# Patient Record
Sex: Male | Born: 1985 | Race: White | Hispanic: No | Marital: Single | Smoking: Never smoker
Health system: Southern US, Community
[De-identification: ages and names within clinical notes are randomized; demographics above are authoritative.]

## PROBLEM LIST (undated history)

## (undated) DIAGNOSIS — F419 Anxiety disorder, unspecified: Secondary | ICD-10-CM

## (undated) DIAGNOSIS — G4730 Sleep apnea, unspecified: Secondary | ICD-10-CM

## (undated) DIAGNOSIS — K219 Gastro-esophageal reflux disease without esophagitis: Secondary | ICD-10-CM

## (undated) DIAGNOSIS — D649 Anemia, unspecified: Secondary | ICD-10-CM

## (undated) DIAGNOSIS — F32A Depression, unspecified: Secondary | ICD-10-CM

## (undated) DIAGNOSIS — F329 Major depressive disorder, single episode, unspecified: Secondary | ICD-10-CM

## (undated) HISTORY — DX: Anxiety disorder, unspecified: F41.9

## (undated) HISTORY — DX: Major depressive disorder, single episode, unspecified: F32.9

## (undated) HISTORY — DX: Depression, unspecified: F32.A

---
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
## 2006-02-03 ENCOUNTER — Emergency Department: Payer: Self-pay | Admitting: Emergency Medicine

## 2007-07-03 ENCOUNTER — Emergency Department: Payer: Self-pay | Admitting: Emergency Medicine

## 2009-05-19 ENCOUNTER — Emergency Department: Payer: Self-pay | Admitting: Emergency Medicine

## 2009-05-20 ENCOUNTER — Emergency Department: Payer: Self-pay | Admitting: Emergency Medicine

## 2009-08-30 ENCOUNTER — Emergency Department: Payer: Self-pay | Admitting: Emergency Medicine

## 2010-10-10 ENCOUNTER — Ambulatory Visit: Payer: Self-pay

## 2010-10-19 ENCOUNTER — Ambulatory Visit: Payer: Self-pay | Admitting: Nephrology

## 2011-08-01 HISTORY — PX: OTHER SURGICAL HISTORY: SHX169

## 2011-09-08 ENCOUNTER — Emergency Department: Payer: Self-pay | Admitting: Unknown Physician Specialty

## 2012-03-19 ENCOUNTER — Emergency Department: Payer: Self-pay | Admitting: Emergency Medicine

## 2012-12-25 ENCOUNTER — Emergency Department: Payer: Self-pay | Admitting: Internal Medicine

## 2015-01-07 ENCOUNTER — Telehealth: Payer: Self-pay | Admitting: Unknown Physician Specialty

## 2015-01-07 NOTE — Telephone Encounter (Signed)
Pt has been added to Cheryl's schedule for tomorrow 01/08/15 @ 1:30pm. Thanks.

## 2015-01-08 ENCOUNTER — Encounter: Payer: Self-pay | Admitting: Unknown Physician Specialty

## 2015-01-08 ENCOUNTER — Ambulatory Visit (INDEPENDENT_AMBULATORY_CARE_PROVIDER_SITE_OTHER): Payer: Self-pay | Admitting: Unknown Physician Specialty

## 2015-01-08 VITALS — BP 110/75 | HR 96 | Temp 98.8°F | Ht 68.5 in | Wt 234.0 lb

## 2015-01-08 DIAGNOSIS — F419 Anxiety disorder, unspecified: Secondary | ICD-10-CM | POA: Insufficient documentation

## 2015-01-08 DIAGNOSIS — F32A Depression, unspecified: Secondary | ICD-10-CM | POA: Insufficient documentation

## 2015-01-08 DIAGNOSIS — G4700 Insomnia, unspecified: Secondary | ICD-10-CM | POA: Insufficient documentation

## 2015-01-08 DIAGNOSIS — F329 Major depressive disorder, single episode, unspecified: Secondary | ICD-10-CM | POA: Insufficient documentation

## 2015-01-08 MED ORDER — CITALOPRAM HYDROBROMIDE 20 MG PO TABS: 20.0000 mg | ORAL_TABLET | Freq: Every day | ORAL | Status: DC

## 2015-01-08 NOTE — Patient Instructions (Addendum)
Insomnia Insomnia is frequent trouble falling and/or staying asleep. Insomnia can be a long term problem or a short term problem. Both are common. Insomnia can be a short term problem when the wakefulness is related to a certain stress or worry. Long term insomnia is often related to ongoing stress during waking hours and/or poor sleeping habits. Overtime, sleep deprivation itself can make the problem worse. Every little thing feels more severe because you are overtired and your ability to cope is decreased. CAUSES   Stress, anxiety, and depression.  Poor sleeping habits.  Distractions such as TV in the bedroom.  Naps close to bedtime.  Engaging in emotionally charged conversations before bed.  Technical reading before sleep.  Alcohol and other sedatives. They may make the problem worse. They can hurt normal sleep patterns and normal dream activity.  Stimulants such as caffeine for several hours prior to bedtime.  Pain syndromes and shortness of breath can cause insomnia.  Exercise late at night.  Changing time zones may cause sleeping problems (jet lag). It is sometimes helpful to have someone observe your sleeping patterns. They should look for periods of not breathing during the night (sleep apnea). They should also look to see how long those periods last. If you live alone or observers are uncertain, you can also be observed at a sleep clinic where your sleep patterns will be professionally monitored. Sleep apnea requires a checkup and treatment. Give your caregivers your medical history. Give your caregivers observations your family has made about your sleep.  SYMPTOMS   Not feeling rested in the morning.  Anxiety and restlessness at bedtime.  Difficulty falling and staying asleep. TREATMENT   Your caregiver may prescribe treatment for an underlying medical disorders. Your caregiver can give advice or help if you are using alcohol or other drugs for self-medication. Treatment  of underlying problems will usually eliminate insomnia problems.  Medications can be prescribed for short time use. They are generally not recommended for lengthy use.  Over-the-counter sleep medicines are not recommended for lengthy use. They can be habit forming.  You can promote easier sleeping by making lifestyle changes such as:  Using relaxation techniques that help with breathing and reduce muscle tension.  Exercising earlier in the day.  Changing your diet and the time of your last meal. No night time snacks.  Establish a regular time to go to bed.  Counseling can help with stressful problems and worry.  Soothing music and white noise may be helpful if there are background noises you cannot remove.  Stop tedious detailed work at least one hour before bedtime. HOME CARE INSTRUCTIONS   Keep a diary. Inform your caregiver about your progress. This includes any medication side effects. See your caregiver regularly. Take note of:  Times when you are asleep.  Times when you are awake during the night.  The quality of your sleep.  How you feel the next day. This information will help your caregiver care for you.  Get out of bed if you are still awake after 15 minutes. Read or do some quiet activity. Keep the lights down. Wait until you feel sleepy and go back to bed.  Keep regular sleeping and waking hours. Avoid naps.  Exercise regularly.  Avoid distractions at bedtime. Distractions include watching television or engaging in any intense or detailed activity like attempting to balance the household checkbook.  Develop a bedtime ritual. Keep a familiar routine of bathing, brushing your teeth, climbing into bed at the same   time each night, listening to soothing music. Routines increase the success of falling to sleep faster.  Use relaxation techniques. This can be using breathing and muscle tension release routines. It can also include visualizing peaceful scenes. You can  also help control troubling or intruding thoughts by keeping your mind occupied with boring or repetitive thoughts like the old concept of counting sheep. You can make it more creative like imagining planting one beautiful flower after another in your backyard garden.  During your day, work to eliminate stress. When this is not possible use some of the previous suggestions to help reduce the anxiety that accompanies stressful situations. MAKE SURE YOU:   Understand these instructions.  Will watch your condition.  Will get help right away if you are not doing well or get worse. Document Released: 07/14/2000 Document Revised: 10/09/2011 Document Reviewed: 08/14/2007 [facility] Patient Information 2015 [place], [place]. This information is not intended to replace advice given to you by your health care provider. Make sure you discuss any questions you have with your health care provider. Panic Attacks Panic attacks are sudden, short-livedsurges of severe anxiety, fear, or discomfort. They may occur for no reason when you are relaxed, when you are anxious, or when you are sleeping. Panic attacks may occur for a number of reasons:   Healthy people occasionally have panic attacks in extreme, life-threatening situations, such as war or natural disasters. Normal anxiety is a protective mechanism of the body that helps [place] react to danger (fight or flight response).  Panic attacks are often seen with anxiety disorders, such as panic disorder, social anxiety disorder, generalized anxiety disorder, and phobias. Anxiety disorders cause excessive or uncontrollable anxiety. They may interfere with your relationships or other life activities.  Panic attacks are sometimes seen with other mental illnesses, such as depression and posttraumatic stress disorder.  Certain medical conditions, prescription medicines, and drugs of abuse can cause panic attacks. SYMPTOMS  Panic attacks start suddenly, peak within 20  minutes, and are accompanied by four or more of the following symptoms:  Pounding heart or fast heart rate (palpitations).  Sweating.  Trembling or shaking.  Shortness of breath or feeling smothered.  Feeling choked.  Chest pain or discomfort.  Nausea or strange feeling in your stomach.  Dizziness, light-headedness, or feeling like you will faint.  Chills or hot flushes.  Numbness or tingling in your lips or hands and feet.  Feeling that things are not real or feeling that you are not yourself.  Fear of losing control or going crazy.  Fear of dying. Some of these symptoms can mimic serious medical conditions. For example, you may think you are having a heart attack. Although panic attacks can be very scary, they are not life threatening. DIAGNOSIS  Panic attacks are diagnosed through an assessment by your health care provider. Your health care provider will ask questions about your symptoms, such as where and when they occurred. Your health care provider will also ask about your medical history and use of alcohol and drugs, including prescription medicines. Your health care provider may order blood tests or other studies to rule out a serious medical condition. Your health care provider may refer you to a mental health professional for further evaluation. TREATMENT   Most healthy people who have one or two panic attacks in an extreme, life-threatening situation will not require treatment.  The treatment for panic attacks associated with anxiety disorders or other mental illness typically involves counseling with a mental health professional, medicine,  or a combination of both. Your health care provider will help determine what treatment is best for you.  Panic attacks due to physical illness usually go away with treatment of the illness. If prescription medicine is causing panic attacks, talk with your health care provider about stopping the medicine, decreasing the dose, or  substituting another medicine.  Panic attacks due to alcohol or drug abuse go away with abstinence. Some adults need professional help in order to stop drinking or using drugs. HOME CARE INSTRUCTIONS   Take all medicines as directed by your health care provider.   Schedule and attend follow-up visits as directed by your health care provider. It is important to keep all your appointments. SEEK MEDICAL CARE IF:  You are not able to take your medicines as prescribed.  Your symptoms do not improve or get worse. SEEK IMMEDIATE MEDICAL CARE IF:   You experience panic attack symptoms that are different than your usual symptoms.  You have serious thoughts about hurting yourself or others.  You are taking medicine for panic attacks and have a serious side effect. MAKE SURE YOU:  Understand these instructions.  Will watch your condition.  Will get help right away if you are not doing well or get worse. Document Released: 07/17/2005 Document Revised: 07/22/2013 Document Reviewed: 02/28/2013 [facility] Patient Information 2015 [place], [place]. This information is not intended to replace advice given to you by your health care provider. Make sure you discuss any questions you have with your health care provider.  2 websites helpful for sleep are [email or web address]

## 2015-01-08 NOTE — Progress Notes (Signed)
   BP 110/75 mmHg  Pulse 96  Temp(Src) 98.8 F (37.1 C) (Oral)  Ht 5' 8.5" (1.74 m)  Wt 234 lb (106.142 kg)  BMI 35.06 kg/m2  SpO2 98%   Subjective:    Patient ID: David Wong, male    DOB: 04/27/1986, 29 y.o.   MRN: [id number]  HPI: David Wong is a 29 y.o. male  Chief Complaint  Patient presents with  . Follow-up    Med refill   Anxiety Symptoms include depressed mood, insomnia, palpitations and shortness of breath. Primary symptoms comment: Depression is a little worse. Symptoms occur occasionally (More frequent). The symptoms are aggravated by work stress. The patient sleeps 5 hours per night. The quality of sleep is poor. Nighttime awakenings: several.   There are no known risk factors. His past medical history is significant for anxiety/panic attacks. Past treatments include SSRIs. The treatment provided significant relief. Compliance with prior treatments has been good.    Relevant past medical, surgical, family and social history reviewed and updated as indicated. Interim medical history since our last visit reviewed. Allergies and medications reviewed and updated.  Review of Systems  Respiratory: Positive for shortness of breath.   Cardiovascular: Positive for palpitations.  Psychiatric/Behavioral: The patient has insomnia.     Per HPI unless specifically indicated above     Objective:    BP 110/75 mmHg  Pulse 96  Temp(Src) 98.8 F (37.1 C) (Oral)  Ht 5' 8.5" (1.74 m)  Wt 234 lb (106.142 kg)  BMI 35.06 kg/m2  SpO2 98%  Wt Readings from Last 3 Encounters:  01/08/15 234 lb (106.142 kg)  06/19/14 226 lb (102.513 kg)    Physical Exam  Constitutional: He is oriented to person, place, and time. He appears well-developed and well-nourished. No distress.  HENT:  Head: Normocephalic and atraumatic.  Eyes: Conjunctivae and lids are normal. Right eye exhibits no discharge. Left eye exhibits no discharge. No scleral icterus.  Cardiovascular: Normal rate,  regular rhythm and normal heart sounds.   Pulmonary/Chest: Effort normal and breath sounds normal. No respiratory distress.  Abdominal: Normal appearance. There is no splenomegaly or hepatomegaly. There is no tenderness.  Musculoskeletal: Normal range of motion.  Neurological: He is alert and oriented to person, place, and time.  Skin: Skin is intact. No rash noted. No pallor.  Psychiatric: He has a normal mood and affect. His behavior is normal. Judgment and thought content normal.       No results found for this or any previous visit.    Assessment & Plan:   Problem List Items Addressed This Visit      Other   Anxiety - Primary    Pt doesn't want to go up on the Citalopram.  Discussed developing an exercise plan.  Go out with friends more.        Relevant Medications   citalopram (CELEXA) 20 MG tablet   Insomnia    Discussed exercise and handout given.  Discussed CBT for sleep          Follow up plan: Return in about 6 months (around 07/10/2015) for physical.

## 2015-01-08 NOTE — Assessment & Plan Note (Signed)
Pt doesn't want to go up on the Citalopram.  Discussed developing an exercise plan.  Go out with friends more.

## 2015-01-08 NOTE — Assessment & Plan Note (Addendum)
Discussed exercise and handout given.  Discussed CBT for sleep

## 2015-03-08 ENCOUNTER — Ambulatory Visit (INDEPENDENT_AMBULATORY_CARE_PROVIDER_SITE_OTHER): Payer: Worker's Compensation | Admitting: Urgent Care

## 2015-03-08 VITALS — BP 108/72 | HR 88 | Temp 98.3°F | Resp 18 | Ht 69.5 in | Wt 230.4 lb

## 2015-03-08 DIAGNOSIS — S61011A Laceration without foreign body of right thumb without damage to nail, initial encounter: Secondary | ICD-10-CM | POA: Diagnosis not present

## 2015-03-08 DIAGNOSIS — Z23 Encounter for immunization: Secondary | ICD-10-CM

## 2015-03-08 MED ORDER — MUPIROCIN 2 % EX OINT: 1.0000 "application " | TOPICAL_OINTMENT | Freq: Three times a day (TID) | CUTANEOUS | Status: DC

## 2015-03-08 NOTE — Patient Instructions (Addendum)
Laceration Care, Adult A laceration is a cut or lesion that goes through all layers of the skin and into the tissue just beneath the skin. TREATMENT  Some lacerations may not require closure. Some lacerations may not be able to be closed due to an increased risk of infection. It is important to see your caregiver as soon as possible after an injury to minimize the risk of infection and maximize the opportunity for successful closure. If closure is appropriate, pain medicines may be given, if needed. The wound will be cleaned to help prevent infection. Your caregiver will use stitches (sutures), staples, wound glue (adhesive), or skin adhesive strips to repair the laceration. These tools bring the skin edges together to allow for faster healing and a better cosmetic outcome. However, all wounds will heal with a scar. Once the wound has healed, scarring can be minimized by covering the wound with sunscreen during the day for 1 full year. HOME CARE INSTRUCTIONS  For sutures or staples:  Keep the wound clean and dry.  If you were given a bandage (dressing), you should change it at least once a day. Also, change the dressing if it becomes wet or dirty, or as directed by your caregiver.  Wash the wound with soap and water 2 times a day. Rinse the wound off with water to remove all soap. Pat the wound dry with a clean towel.  After cleaning, apply a thin layer of the antibiotic ointment as recommended by your caregiver. This will help prevent infection and keep the dressing from sticking.  You may shower as usual after the first 24 hours. Do not soak the wound in water until the sutures are removed.  Only take over-the-counter or prescription medicines for pain, discomfort, or fever as directed by your caregiver.  Get your sutures or staples removed as directed by your caregiver. For skin adhesive strips:  Keep the wound clean and dry.  Do not get the skin adhesive strips wet. You may bathe  carefully, using caution to keep the wound dry.  If the wound gets wet, pat it dry with a clean towel.  Skin adhesive strips will fall off on their own. You may trim the strips as the wound heals. Do not remove skin adhesive strips that are still stuck to the wound. They will fall off in time. For wound adhesive:  You may briefly wet your wound in the shower or bath. Do not soak or scrub the wound. Do not swim. Avoid periods of heavy perspiration until the skin adhesive has fallen off on its own. After showering or bathing, gently pat the wound dry with a clean towel.  Do not apply liquid medicine, cream medicine, or ointment medicine to your wound while the skin adhesive is in place. This may loosen the film before your wound is healed.  If a dressing is placed over the wound, be careful not to apply tape directly over the skin adhesive. This may cause the adhesive to be pulled off before the wound is healed.  Avoid prolonged exposure to sunlight or tanning lamps while the skin adhesive is in place. Exposure to ultraviolet light in the first year will darken the scar.  The skin adhesive will usually remain in place for 5 to 10 days, then naturally fall off the skin. Do not pick at the adhesive film. You may need a tetanus shot if:  You cannot remember when you had your last tetanus shot.  You have never had a tetanus  shot. If you get a tetanus shot, your arm may swell, get red, and feel warm to the touch. This is common and not a problem. If you need a tetanus shot and you choose not to have one, there is a rare chance of getting tetanus. Sickness from tetanus can be serious. SEEK MEDICAL CARE IF:   You have redness, swelling, or increasing pain in the wound.  You see a red line that goes away from the wound.  You have yellowish-white fluid (pus) coming from the wound.  You have a fever.  You notice a bad smell coming from the wound or dressing.  Your wound breaks open before or  after sutures have been removed.  You notice something coming out of the wound such as wood or glass.  Your wound is on your hand or foot and you cannot move a finger or toe. SEEK IMMEDIATE MEDICAL CARE IF:   Your pain is not controlled with prescribed medicine.  You have severe swelling around the wound causing pain and numbness or a change in color in your arm, hand, leg, or foot.  Your wound splits open and starts bleeding.  You have worsening numbness, weakness, or loss of function of any joint around or beyond the wound.  You develop painful lumps near the wound or on the skin anywhere on your body. MAKE SURE YOU:   Understand these instructions.  Will watch your condition.  Will get help right away if you are not doing well or get worse. Document Released: 07/17/2005 Document Revised: 10/09/2011 Document Reviewed: 01/10/2011 [facility] Patient Information 2015 [place], [place]. This information is not intended to replace advice given to you by your health care provider. Make sure you discuss any questions you have with your health care provider.  Tdap Vaccine (Tetanus, Diphtheria, Pertussis): What You Need to Know 1. Why get vaccinated? Tetanus, diphtheria and pertussis can be very serious diseases, even for adolescents and adults. Tdap vaccine can protect [place] from these diseases. TETANUS (Lockjaw) causes painful muscle tightening and stiffness, usually all over the body.  It can lead to tightening of muscles in the head and neck so you can't open your mouth, swallow, or sometimes even breathe. Tetanus kills about 1 out of 5 people who are infected. DIPHTHERIA can cause a thick coating to form in the back of the throat.  It can lead to breathing problems, paralysis, heart failure, and death. PERTUSSIS (Whooping Cough) causes severe coughing spells, which can cause difficulty breathing, vomiting and disturbed sleep.  It can also lead to weight loss, incontinence, and rib  fractures. Up to 2 in 100 adolescents and 5 in 100 adults with pertussis are hospitalized or have complications, which could include pneumonia or death. These diseases are caused by bacteria. Diphtheria and pertussis are spread from person to person through coughing or sneezing. Tetanus enters the body through cuts, scratches, or wounds. Before vaccines, the [place] saw as many as 200,000 cases a year of diphtheria and pertussis, and hundreds of cases of tetanus. Since vaccination began, tetanus and diphtheria have dropped by about 99% and pertussis by about 80%. 2. Tdap vaccine Tdap vaccine can protect adolescents and adults from tetanus, diphtheria, and pertussis. One dose of Tdap is routinely given at age 67 or 52. People who did not get Tdap at that age should get it as soon as possible. Tdap is especially important for health care professionals and anyone having close contact with a baby younger than 12 months. Pregnant  women should get a dose of Tdap during every pregnancy, to protect the newborn from pertussis. Infants are most at risk for severe, life-threatening complications from pertussis. A similar vaccine, called Td, protects from tetanus and diphtheria, but not pertussis. A Td booster should be given every 10 years. Tdap may be given as one of these boosters if you have not already gotten a dose. Tdap may also be given after a severe cut or burn to prevent tetanus infection. Your doctor can give you more information. Tdap may safely be given at the same time as other vaccines. 3. Some people should not get this vaccine  If you ever had a life-threatening allergic reaction after a dose of any tetanus, diphtheria, or pertussis containing vaccine, OR if you have a severe allergy to any part of this vaccine, you should not get Tdap. Tell your doctor if you have any severe allergies.  If you had a coma, or long or multiple seizures within 7 days after a childhood dose of DTP or DTaP, you  should not get Tdap, unless a cause other than the vaccine was found. You can still get Td.  Talk to your doctor if you:  have epilepsy or another nervous system problem,  had severe pain or swelling after any vaccine containing diphtheria, tetanus or pertussis,  ever had Guillain-Barr Syndrome (GBS),  aren't feeling well on the day the shot is scheduled. 4. Risks of a vaccine reaction With any medicine, including vaccines, there is a chance of side effects. These are usually mild and go away on their own, but serious reactions are also possible. Brief fainting spells can follow a vaccination, leading to injuries from falling. Sitting or lying down for about 15 minutes can help prevent these. Tell your doctor if you feel dizzy or light-headed, or have vision changes or ringing in the ears. Mild problems following Tdap (Did not interfere with activities)  Pain where the shot was given (about 3 in 4 adolescents or 2 in 3 adults)  Redness or swelling where the shot was given (about 1 person in 5)  Mild fever of at least 100.70F (up to about 1 in 25 adolescents or 1 in 100 adults)  Headache (about 3 or 4 people in 10)  Tiredness (about 1 person in 3 or 4)  Nausea, vomiting, diarrhea, stomach ache (up to 1 in 4 adolescents or 1 in 10 adults)  Chills, body aches, sore joints, rash, swollen glands (uncommon) Moderate problems following Tdap (Interfered with activities, but did not require medical attention)  Pain where the shot was given (about 1 in 5 adolescents or 1 in 100 adults)  Redness or swelling where the shot was given (up to about 1 in 16 adolescents or 1 in 25 adults)  Fever over 102F (about 1 in 100 adolescents or 1 in 250 adults)  Headache (about 3 in 20 adolescents or 1 in 10 adults)  Nausea, vomiting, diarrhea, stomach ache (up to 1 or 3 people in 100)  Swelling of the entire arm where the shot was given (up to about 3 in 100). Severe problems following  Tdap (Unable to perform usual activities; required medical attention)  Swelling, severe pain, bleeding and redness in the arm where the shot was given (rare). A severe allergic reaction could occur after any vaccine (estimated less than 1 in a million doses). 5. What if there is a serious reaction? What should I look for?  Look for anything that concerns you, such as signs  of a severe allergic reaction, very high fever, or behavior changes. Signs of a severe allergic reaction can include hives, swelling of the face and throat, difficulty breathing, a fast heartbeat, dizziness, and weakness. These would start a few minutes to a few hours after the vaccination. What should I do?  If you think it is a severe allergic reaction or other emergency that can't wait, call 9-1-1 or get the person to the nearest hospital. Otherwise, call your doctor.  Afterward, the reaction should be reported to the "Vaccine Adverse Event Reporting System" (VAERS). Your doctor might file this report, or you can do it yourself through the VAERS web site at www.vaers.SamedayNews.es, or by calling 6207244126. VAERS is only for reporting reactions. They do not give medical advice.  6. The National Vaccine Injury Compensation Program The [employer] Vaccine Injury Compensation Program (VICP) is a federal program that was created to compensate people who may have been injured by certain vaccines. Persons who believe they may have been injured by a vaccine can learn about the program and about filing a claim by calling 567-209-8585 or visiting the [place] website at GoldCloset.com.ee. 7. How can I learn more?  Ask your doctor.  Call your local or state health department.  Contact the Centers for Disease Control and Prevention (CDC):  Call 502-320-7873 or visit CDC's website at http://hunter.com/. CDC Tdap Vaccine VIS (12/07/11) Document Released: 01/16/2012 Document Revised: 12/01/2013 Document Reviewed:  10/29/2013 [employer] Patient Information 2015 [place], [place]. This information is not intended to replace advice given to you by your health care provider. Make sure you discuss any questions you have with your health care provider.

## 2015-03-08 NOTE — Progress Notes (Addendum)
    MRN: [id number] DOB: [birth year]  Subjective:   David Wong is a 29 y.o. male presenting for chief complaint of workers comp  Reports right thumb laceration today while using a metal grinder at work. Patient had minimal bleeding, used alcohol to clean his wound and was advised by his supervisor to come in for evaluation. Denies loss of range of motion, decreased sensation, decreased strength, numbness or tingling, bony deformity. He cannot recall his last TDAP. Denies any other aggravating or relieving factors, no other questions or concerns.  David Wong's medications list, allergies, pmh and psh were reviewed and excluded from this note due to being a worker's comp case.  ROS As in subjective.  Objective:   Vitals: BP 108/72 mmHg  Pulse 88  Temp(Src) 98.3 F (36.8 C) (Oral)  Resp 18  Ht 5' 9.5" (1.765 m)  Wt 230 lb 6.4 oz (104.509 kg)  BMI 33.55 kg/m2  SpO2 99%  Physical Exam  Constitutional: He is oriented to person, place, and time. He appears well-developed and well-nourished.  Eyes: Conjunctivae are normal. No scleral icterus.  Cardiovascular: Normal rate.   Pulmonary/Chest: Effort normal.  Musculoskeletal:       Right hand: He exhibits normal range of motion, no tenderness, no bony tenderness, normal capillary refill, no deformity, no laceration and no swelling. Normal sensation noted. Normal strength noted.       Hands: Neurological: He is alert and oriented to person, place, and time.  Skin: Skin is warm and dry. No rash noted. No erythema. No pallor.  Psychiatric: He has a normal mood and affect.    Assessment and Plan :   1. Laceration of thumb, right, initial encounter - Stable, wound is 2 superficial for sutures. Wound was cleansed and dressed. Apply Bactroban 2-3 times for one week. Keep covered while at work, return for re-evaluation as needed. - Tdap vaccine greater than or equal to 7yo IM   Jaynee Eagles, PA-C [facility] [phone] 03/08/2015 2:41 PM

## 2015-03-13 NOTE — Progress Notes (Signed)
  Medical screening examination/treatment/procedure(s) were performed by non-physician practitioner and as supervising physician I was immediately available for consultation/collaboration.     

## 2015-06-07 ENCOUNTER — Other Ambulatory Visit: Payer: Self-pay

## 2015-06-07 MED ORDER — CITALOPRAM HYDROBROMIDE 20 MG PO TABS: 20.0000 mg | ORAL_TABLET | Freq: Every day | ORAL | Status: DC

## 2015-06-07 NOTE — Telephone Encounter (Signed)
Patient was last seen in June and has appointment 07/10/15. Pharmacy is [facility] in [place].

## 2015-07-12 ENCOUNTER — Ambulatory Visit (INDEPENDENT_AMBULATORY_CARE_PROVIDER_SITE_OTHER): Payer: Managed Care, Other (non HMO) | Admitting: Unknown Physician Specialty

## 2015-07-12 ENCOUNTER — Encounter: Payer: Self-pay | Admitting: Unknown Physician Specialty

## 2015-07-12 VITALS — BP 120/85 | HR 79 | Temp 98.7°F | Ht 70.2 in | Wt 234.4 lb

## 2015-07-12 DIAGNOSIS — Z0000 Encounter for general adult medical examination without abnormal findings: Secondary | ICD-10-CM | POA: Diagnosis not present

## 2015-07-12 DIAGNOSIS — E669 Obesity, unspecified: Secondary | ICD-10-CM

## 2015-07-12 DIAGNOSIS — F419 Anxiety disorder, unspecified: Secondary | ICD-10-CM | POA: Diagnosis not present

## 2015-07-12 MED ORDER — CITALOPRAM HYDROBROMIDE 20 MG PO TABS: 20.0000 mg | ORAL_TABLET | Freq: Every day | ORAL | Status: DC

## 2015-07-12 NOTE — Progress Notes (Signed)
   BP 120/85 mmHg  Pulse 79  Temp(Src) 98.7 F (37.1 C)  Ht 5' 10.2" (1.783 m)  Wt 234 lb 6.4 oz (106.323 kg)  BMI 33.44 kg/m2  SpO2 97%   Subjective:    Patient ID: David Wong, male    DOB: May 17, 1986, 29 y.o.   MRN: [id number]  HPI: David Wong is a 29 y.o. male  Chief Complaint  Patient presents with  . Medication Refill    pt states he needs citalopram refilled   Also needs physical  Relevant past medical, surgical, family and social history reviewed and updated as indicated. Interim medical history since our last visit reviewed. Allergies and medications reviewed and updated.  Review of Systems  Constitutional: Negative.   HENT: Negative.   Eyes: Negative.   Respiratory: Negative.   Cardiovascular: Negative.   Gastrointestinal: Negative.   Endocrine: Negative.   Genitourinary: Negative.   Skin: Negative.   Allergic/Immunologic: Negative.   Neurological: Negative.   Hematological: Negative.   Psychiatric/Behavioral: Negative.     Per HPI unless specifically indicated above     Objective:    BP 120/85 mmHg  Pulse 79  Temp(Src) 98.7 F (37.1 C)  Ht 5' 10.2" (1.783 m)  Wt 234 lb 6.4 oz (106.323 kg)  BMI 33.44 kg/m2  SpO2 97%  Wt Readings from Last 3 Encounters:  07/12/15 234 lb 6.4 oz (106.323 kg)  03/08/15 230 lb 6.4 oz (104.509 kg)  01/08/15 234 lb (106.142 kg)    Physical Exam  Constitutional: He is oriented to person, place, and time. He appears well-developed and well-nourished.  HENT:  Head: Normocephalic.  Eyes: Pupils are equal, round, and reactive to light.  Cardiovascular: Normal rate, regular rhythm and normal heart sounds.   Pulmonary/Chest: Effort normal.  Abdominal: Soft. Bowel sounds are normal.  Musculoskeletal: Normal range of motion.  Neurological: He is alert and oriented to person, place, and time. He has normal reflexes.  Skin: Skin is warm and dry.  Psychiatric: He has a normal mood and affect. His behavior is  normal. Judgment and thought content normal.    No results found for this or any previous visit.    Assessment & Plan:   Problem List Items Addressed This Visit      Unprioritized   Anxiety   Relevant Medications   citalopram (CELEXA) 20 MG tablet    Other Visit Diagnoses    Annual physical exam    -  Primary    Relevant Orders    CBC    Comprehensive metabolic panel    HIV antibody    Lipid Panel w/o Chol/HDL Ratio    TSH    Obesity          Discussed diet and exercise.     Follow up plan: Return in about 1 year (around 07/11/2016).

## 2015-07-13 ENCOUNTER — Encounter: Payer: Self-pay | Admitting: Unknown Physician Specialty

## 2015-07-13 LAB — COMPREHENSIVE METABOLIC PANEL
ALT: 69 IU/L — ABNORMAL HIGH (ref 0–44)
AST: 29 IU/L (ref 0–40)
Albumin/Globulin Ratio: 1.6 (ref 1.1–2.5)
Albumin: 4.4 g/dL (ref 3.5–5.5)
Alkaline Phosphatase: 101 IU/L (ref 39–117)
BUN/Creatinine Ratio: 14 (ref 8–19)
BUN: 14 mg/dL (ref 6–20)
Bilirubin Total: 0.4 mg/dL (ref 0.0–1.2)
CO2: 21 mmol/L (ref 18–29)
Calcium: 9.4 mg/dL (ref 8.7–10.2)
Chloride: 107 mmol/L — ABNORMAL HIGH (ref 96–106)
Creatinine, Ser: 1.01 mg/dL (ref 0.76–1.27)
GFR calc Af Amer: 116 mL/min/{1.73_m2} (ref >59)
GFR calc non Af Amer: 101 mL/min/{1.73_m2} (ref >59)
Globulin, Total: 2.7 g/dL (ref 1.5–4.5)
Glucose: 98 mg/dL (ref 65–99)
Potassium: 4.1 mmol/L (ref 3.5–5.2)
Sodium: 141 mmol/L (ref 134–144)
Total Protein: 7.1 g/dL (ref 6.0–8.5)

## 2015-07-13 LAB — CBC
Hematocrit: 43.9 % (ref 37.5–51.0)
Hemoglobin: 15.1 g/dL (ref 12.6–17.7)
MCH: 31.7 pg (ref 26.6–33.0)
MCHC: 34.4 g/dL (ref 31.5–35.7)
MCV: 92 fL (ref 79–97)
Platelets: 218 10*3/uL (ref 150–379)
RBC: 4.77 x10E6/uL (ref 4.14–5.80)
RDW: 12.4 % (ref 12.3–15.4)
WBC: 7.7 10*3/uL (ref 3.4–10.8)

## 2015-07-13 LAB — LIPID PANEL W/O CHOL/HDL RATIO
Cholesterol, Total: 258 mg/dL — ABNORMAL HIGH (ref 100–199)
HDL: 46 mg/dL (ref >39)
LDL Calculated: 188 mg/dL — ABNORMAL HIGH (ref 0–99)
Triglycerides: 118 mg/dL (ref 0–149)
VLDL Cholesterol Cal: 24 mg/dL (ref 5–40)

## 2015-07-13 LAB — HIV ANTIBODY (ROUTINE TESTING W REFLEX): HIV Screen 4th Generation wRfx: NONREACTIVE

## 2015-07-13 LAB — TSH: TSH: 1.7 u[IU]/mL (ref 0.450–4.500)

## 2016-06-26 ENCOUNTER — Encounter: Payer: Self-pay | Admitting: Emergency Medicine

## 2016-06-26 ENCOUNTER — Emergency Department
Admission: EM | Admit: 2016-06-26 | Discharge: 2016-06-26 | Disposition: A | Discharge status: Home / Self Care | Payer: Self-pay | Attending: Emergency Medicine | Admitting: Emergency Medicine

## 2016-06-26 DIAGNOSIS — Y929 Unspecified place or not applicable: Secondary | ICD-10-CM | POA: Insufficient documentation

## 2016-06-26 DIAGNOSIS — Y9389 Activity, other specified: Secondary | ICD-10-CM | POA: Insufficient documentation

## 2016-06-26 DIAGNOSIS — Y999 Unspecified external cause status: Secondary | ICD-10-CM | POA: Insufficient documentation

## 2016-06-26 DIAGNOSIS — S61214A Laceration without foreign body of right ring finger without damage to nail, initial encounter: Secondary | ICD-10-CM | POA: Insufficient documentation

## 2016-06-26 DIAGNOSIS — W268XXA Contact with other sharp object(s), not elsewhere classified, initial encounter: Secondary | ICD-10-CM | POA: Insufficient documentation

## 2016-06-26 NOTE — ED Notes (Signed)
Pt has laceration to right ring finger - he was working on a car and a bolt broke lose causing a piece of metal to lacerate his finger approx one hour ago

## 2016-06-26 NOTE — ED Triage Notes (Signed)
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

## 2016-06-26 NOTE — ED Provider Notes (Signed)
[facility] Emergency Department Provider Note  ____________________________________________  Time seen: Approximately 1:34 PM  I have reviewed the triage vital signs and the nursing notes.   HISTORY  Chief Complaint Laceration   HPI David Wong is a 30 y.o. male that presents after injuring his right ring finger on car a couple hours ago. Patient denies any additional trauma. Patient states there was minimal bleeding. Patient's last tetanus shot was one year ago.  Past Medical History:  Diagnosis Date  . Anxiety   . Depression     Patient Active Problem List   Diagnosis Date Noted  . Anxiety 01/08/2015  . Clinical depression 01/08/2015  . Insomnia 01/08/2015    Past Surgical History:  Procedure Laterality Date  . right pinky finger  2013   pins inserted    Prior to Admission medications   Medication Sig Start Date End Date Taking? Authorizing Provider  citalopram (CELEXA) 20 MG tablet Take 1 tablet (20 mg total) by mouth daily. 07/12/15   Kathrine Haddock, NP    Allergies Patient has no known allergies.  Family History  Problem Relation Age of Onset  . Hyperlipidemia Mother   . Hypertension Mother   . Hyperlipidemia Father   . Diabetes Father   . Hypertension Brother   . Arthritis Maternal Grandfather     RA  . Heart disease Paternal Grandfather     Social History Social History  Substance Use Topics  . Smoking status: Never Smoker  . Smokeless tobacco: Never Used  . Alcohol use 0.0 oz/week     Comment: Rarely    Review of Systems  Constitutional: Negative for fever/chills Cardiovascular: Negative for chest pain Respiratory: Negative for shortness of breath. Musculoskeletal: Full ROM of finger Skin: No rash or bruising. Neurological: Negative for headaches, focal weakness or numbness. ____________________________________________   PHYSICAL EXAM:  VITAL SIGNS: ED Triage Vitals  Enc Vitals Group     BP 06/26/16  1211 121/85     Pulse Rate 06/26/16 1211 70     Resp 06/26/16 1211 20     Temp 06/26/16 1211 98 F (36.7 C)     Temp Source 06/26/16 1211 Oral     SpO2 06/26/16 1211 99 %     Weight 06/26/16 1211 225 lb (102.1 kg)     Height 06/26/16 1211 5\' 10"  (1.778 m)     Head Circumference --      Peak Flow --      Pain Score 06/26/16 1212 8     Pain Loc --      Pain Edu? --      Excl. in [place]? --      Constitutional: Alert and oriented. Well appearing and in no acute distress. Eyes: Conjunctivae are normal. EOMI. Nose: No congestion/rhinnorhea. Mouth/Throat: Mucous membranes are moist.   Neck: No stridor. Cardiovascular: Good peripheral circulation. Respiratory: Normal respiratory effort.  No retractions. Musculoskeletal: FROM throughout. Neurologic:  Normal speech and language. No gross focal neurologic deficits are appreciated. Sensation in hand and fingers intact.  Skin:  1 cm shallow skin flap at nail base. Nail unaffected.    ____________________________________________   LABS (all labs ordered are listed, but only abnormal results are displayed)  Labs Reviewed - No data to display ____________________________________________  EKG   RADIOLOGY   PROCEDURES  Procedure(s) performed:  Wound was cleaned with normal saline and iodine. Steristrips placed and finger was wrapped in gauze.    INITIAL IMPRESSION / ASSESSMENT AND PLAN /  ED COURSE  Clinical Course    Laceration was shallow so sutures are not indicated. Patient was instructed to leave the Steri-Strips in place and allow to fall off by themselves. He was instructed to return for any signs of infection including fever swelling redness. All patients questions were answered.  Pertinent labs & imaging results that were available during my care of the patient were reviewed by me and considered in my medical decision making (see chart for details).   ____________________________________________   FINAL CLINICAL  IMPRESSION(S) / ED DIAGNOSES  Final diagnoses:  Laceration of right ring finger without foreign body without damage to nail, initial encounter    New Prescriptions   No medications on file    Note:  This document was prepared using Dragon voice recognition software and may include unintentional dictation errors.   Laban Emperor, PA-C 06/26/16 1411    Lavonia Drafts, MD 06/26/16 [phone]

## 2016-07-12 ENCOUNTER — Encounter: Payer: Self-pay | Admitting: Unknown Physician Specialty

## 2016-08-10 ENCOUNTER — Telehealth: Payer: Self-pay

## 2016-08-10 MED ORDER — CITALOPRAM HYDROBROMIDE 20 MG PO TABS: 20.0000 mg | ORAL_TABLET | Freq: Every day | ORAL | 3 refills | Status: DC

## 2016-08-10 NOTE — Telephone Encounter (Signed)
Patient was scheduled to see David Wong tomorrow but his appointment had to be moved due to provider illness. Patient stated that he is completely out of his citalopram and would like enough sent to [facility] to get to his appointment. I told the patient that I would send the message to Dr. Jeananne Rama since David Wong is out of the office.

## 2016-08-11 ENCOUNTER — Ambulatory Visit: Payer: Self-pay | Admitting: Unknown Physician Specialty

## 2016-08-15 ENCOUNTER — Ambulatory Visit: Payer: Self-pay | Admitting: Unknown Physician Specialty

## 2017-01-24 ENCOUNTER — Emergency Department
Admission: EM | Admit: 2017-01-24 | Discharge: 2017-01-24 | Disposition: A | Discharge status: Home / Self Care | Payer: Worker's Compensation | Attending: Emergency Medicine | Admitting: Emergency Medicine

## 2017-01-24 ENCOUNTER — Emergency Department: Payer: Worker's Compensation

## 2017-01-24 ENCOUNTER — Encounter: Payer: Self-pay | Admitting: Emergency Medicine

## 2017-01-24 DIAGNOSIS — Y9301 Activity, walking, marching and hiking: Secondary | ICD-10-CM | POA: Insufficient documentation

## 2017-01-24 DIAGNOSIS — Y990 Civilian activity done for income or pay: Secondary | ICD-10-CM | POA: Diagnosis not present

## 2017-01-24 DIAGNOSIS — Y929 Unspecified place or not applicable: Secondary | ICD-10-CM | POA: Insufficient documentation

## 2017-01-24 DIAGNOSIS — W19XXXA Unspecified fall, initial encounter: Secondary | ICD-10-CM | POA: Diagnosis not present

## 2017-01-24 DIAGNOSIS — Z79899 Other long term (current) drug therapy: Secondary | ICD-10-CM | POA: Insufficient documentation

## 2017-01-24 DIAGNOSIS — S60221A Contusion of right hand, initial encounter: Secondary | ICD-10-CM

## 2017-01-24 DIAGNOSIS — S6991XA Unspecified injury of right wrist, hand and finger(s), initial encounter: Secondary | ICD-10-CM | POA: Diagnosis present

## 2017-01-24 NOTE — Discharge Instructions (Signed)
Ice and elevate as needed for swelling and pain. Wear Ace wrap for padding and protection. Begin taking over-the-counter ibuprofen as needed for pain. Follow-up with your companies doctor or the [employer] comp clinic at [facility].

## 2017-01-24 NOTE — ED Provider Notes (Signed)
[facility] Emergency Department Provider Note ____________________________________________  Time seen: 9:02 AM  I have reviewed the triage vital signs and the nursing notes.  HISTORY  Chief Complaint  Hand Pain   HPI David Wong is a 31 y.o. male is here with a Workmen's Comp. injury that occurred yesterday.  Patient states he fell at work and has had continued pain to his right thumb since that time. He complains of swelling to his right thumb along with increased pain with range of motion. Patient rates his pain as an 8 out of 10.   Past Medical History:  Diagnosis Date  . Anxiety   . Depression     Patient Active Problem List   Diagnosis Date Noted  . Anxiety 01/08/2015  . Clinical depression 01/08/2015  . Insomnia 01/08/2015    Past Surgical History:  Procedure Laterality Date  . right pinky finger  2013   pins inserted    Prior to Admission medications   Medication Sig Start Date End Date Taking? Authorizing Provider  citalopram (CELEXA) 20 MG tablet Take 1 tablet (20 mg total) by mouth daily. 08/10/16   Guadalupe Maple, MD    Allergies Patient has no known allergies.  Family History  Problem Relation Age of Onset  . Hyperlipidemia Mother   . Hypertension Mother   . Hyperlipidemia Father   . Diabetes Father   . Hypertension Brother   . Arthritis Maternal Grandfather        RA  . Heart disease Paternal Grandfather     Social History Social History  Substance Use Topics  . Smoking status: Never Smoker  . Smokeless tobacco: Never Used  . Alcohol use 0.0 oz/week     Comment: Rarely    Review of Systems  Constitutional: Normal activity. Cardiovascular: Negative for chest pain. Respiratory: Negative for shortness of breath. Musculoskeletal: Positive right hand pain. Skin: Positive for bruising. Neurological: Negative for headaches, focal weakness or numbness. ____________________________________________  PHYSICAL  EXAM:  VITAL SIGNS: ED Triage Vitals  Enc Vitals Group     BP 01/24/17 0803 (!) 138/96     Pulse Rate 01/24/17 0803 96     Resp 01/24/17 0803 14     Temp 01/24/17 0803 98.1 F (36.7 C)     Temp Source 01/24/17 0803 Oral     SpO2 01/24/17 0803 99 %     Weight 01/24/17 0809 225 lb (102.1 kg)     Height 01/24/17 0811 5\' 10"  (1.778 m)     Head Circumference --      Peak Flow --      Pain Score 01/24/17 0802 8     Pain Loc --      Pain Edu? --      Excl. in [place]? --     Constitutional: Alert and oriented. Well appearing and in no distress. Head: Normocephalic and atraumatic. Neck: No stridor Cardiovascular: Normal rate, regular rhythm. Normal distal pulses. Respiratory: Normal respiratory effort. No wheezes/rales/rhonchi. Musculoskeletal: On examination of the right hand there is no gross deformity noted. There is moderate swelling of the thumb and first metacarpal area. There is some mild bruising present. Soft tissue swelling is present. Motor sensory function intact. Capillary refill is less than 3 seconds. Skin is intact. Patient is able to flex and extend but is limited due to swelling. Neurologic:  Normal gait without ataxia. Normal speech and language. No gross focal neurologic deficits are appreciated. Skin:  Skin is warm, dry and  intact. Ecchymosis noted on the volar aspect of the right hand medial aspect. Psychiatric: Mood and affect are normal. Patient exhibits appropriate insight and judgment.    RADIOLOGY Right thumb x-ray per radiologist is negative for fracture or dislocation. I, Johnn Hai, personally viewed and evaluated these images (plain radiographs) as part of my medical decision making, as well as reviewing the written report by the radiologist. ____________________________________________  INITIAL IMPRESSION / [place] / ED COURSE  Patient was reassured that the x-ray did not show any fracture. Patient was placed in an Ace wrap for protection  of his right hand. He is to take over-the-counter ibuprofen as needed for pain. He is aware to ice and elevate as needed for swelling and pain. He is follow-up at [facility] or doctor of his company's choice.    ____________________________________________  FINAL CLINICAL IMPRESSION(S) / ED DIAGNOSES  Final diagnoses:  Contusion of right hand, initial encounter  Fall, initial encounter     Philomena Course 01/24/17 1357    Carrie Mew, MD 01/26/17 2318

## 2017-01-24 NOTE — ED Notes (Signed)
Performed W/C with pt;pt completed COC and provided UDS; hand delivered to lab; pt given his and employers copy of [place]

## 2017-01-24 NOTE — ED Notes (Signed)
See triage note  States he was trying to close a bay door yesterday at work  The rope broke  He fell back hitting a table  Having some pain to left posterior shoulder and also right thumb area

## 2017-01-24 NOTE — ED Triage Notes (Addendum)
Pt reports right thumb pain since yesterday, reports he fell on right thumb. No obvious deformity. Swelling noted to right thumb. Pt reports this happened at work, Mr. David Wong in [place].

## 2017-06-29 ENCOUNTER — Ambulatory Visit: Payer: BLUE CROSS/BLUE SHIELD | Admitting: Unknown Physician Specialty

## 2017-06-29 ENCOUNTER — Encounter: Payer: Self-pay | Admitting: Unknown Physician Specialty

## 2017-06-29 DIAGNOSIS — F324 Major depressive disorder, single episode, in partial remission: Secondary | ICD-10-CM

## 2017-06-29 DIAGNOSIS — G4733 Obstructive sleep apnea (adult) (pediatric): Secondary | ICD-10-CM

## 2017-06-29 DIAGNOSIS — R5382 Chronic fatigue, unspecified: Secondary | ICD-10-CM

## 2017-06-29 DIAGNOSIS — R5383 Other fatigue: Secondary | ICD-10-CM | POA: Insufficient documentation

## 2017-06-29 DIAGNOSIS — G4730 Sleep apnea, unspecified: Secondary | ICD-10-CM | POA: Insufficient documentation

## 2017-06-29 MED ORDER — ESCITALOPRAM OXALATE 10 MG PO TABS: 10.0000 mg | ORAL_TABLET | Freq: Every day | ORAL | 1 refills | Status: DC

## 2017-06-29 NOTE — Assessment & Plan Note (Signed)
Change to Escitalopram.  Get sleep study.  Follow up with labs during a physical

## 2017-06-29 NOTE — Assessment & Plan Note (Signed)
Stable on Citalopram

## 2017-06-29 NOTE — Progress Notes (Signed)
BP 129/79   Pulse (!) 102   Temp 98.6 F (37 C) (Oral)   Ht 5' 10.2" (1.783 m)   Wt 236 lb (107 kg)   SpO2 98%   BMI 33.67 kg/m    Subjective:    Patient ID: David Wong, male    DOB: [birth year], 31 y.o.   MRN: [id number]  HPI: David Wong is a 31 y.o. male  Chief Complaint  Patient presents with  . Anxiety   Pt is lost to f/u.  Pt states he has not run out of Citalopram but would like to continue.  Feels medication is working well.  Still having trouble with fatigue.  Not sure if it's related to medication or not Depression screen [facility] 2/9 06/29/2017 07/12/2015 01/08/2015  Decreased Interest 1 0 1  Down, Depressed, Hopeless 2 1 2   PHQ - 2 Score 3 1 3   Altered sleeping 2 - 1  Tired, decreased energy 3 - 1  Change in appetite 1 - 0  Feeling bad or failure about yourself  1 - 1  Trouble concentrating 0 - 1  Moving slowly or fidgety/restless 0 - 0  Suicidal thoughts 0 - 0  PHQ-9 Score 10 - 7   Sleep apnea Runs in the family.  Ex fiance said he snored badly.  Does not wake with headaches.  Falls asleep easily.  Does not wake rested.    Relevant past medical, surgical, family and social history reviewed and updated as indicated. Interim medical history since our last visit reviewed. Allergies and medications reviewed and updated.  Review of Systems  Constitutional: Negative.   HENT: Negative.   Eyes: Negative.   Respiratory: Negative.   Cardiovascular: Negative.   Gastrointestinal: Negative.   Musculoskeletal: Negative.   Psychiatric/Behavioral: Negative.     Per HPI unless specifically indicated above     Objective:    BP 129/79   Pulse (!) 102   Temp 98.6 F (37 C) (Oral)   Ht 5' 10.2" (1.783 m)   Wt 236 lb (107 kg)   SpO2 98%   BMI 33.67 kg/m   Wt Readings from Last 3 Encounters:  06/29/17 236 lb (107 kg)  01/24/17 225 lb (102.1 kg)  06/26/16 225 lb (102.1 kg)    Physical Exam  Constitutional: He is oriented to person, place, and time. He  appears well-developed and well-nourished. No distress.  HENT:  Head: Normocephalic and atraumatic.  Eyes: Conjunctivae and lids are normal. Right eye exhibits no discharge. Left eye exhibits no discharge. No scleral icterus.  Neck: Normal range of motion. Neck supple. No JVD present. Carotid bruit is not present.  Cardiovascular: Normal rate, regular rhythm and normal heart sounds.  Pulmonary/Chest: Effort normal and breath sounds normal. No respiratory distress.  Abdominal: Normal appearance. There is no splenomegaly or hepatomegaly.  Musculoskeletal: Normal range of motion.  Neurological: He is alert and oriented to person, place, and time.  Skin: Skin is warm, dry and intact. No rash noted. No pallor.  Psychiatric: He has a normal mood and affect. His behavior is normal. Judgment and thought content normal.      Assessment & Plan:   Problem List Items Addressed This Visit      Unprioritized   Depression, major, single episode, in partial remission ([place])    Stable on Citalopram      Relevant Medications   escitalopram (LEXAPRO) 10 MG tablet   Fatigue    Change to Escitalopram.  Get sleep study.  Follow up with labs during a physical      Sleep apnea    Probable based on history.  Schedule sleep study      Relevant Orders   Ambulatory referral to Sleep Studies       Follow up plan: Return for physical.

## 2017-06-29 NOTE — Assessment & Plan Note (Signed)
Probable based on history.  Schedule sleep study

## 2017-07-02 ENCOUNTER — Telehealth: Payer: Self-pay | Admitting: Unknown Physician Specialty

## 2017-07-02 MED ORDER — CITALOPRAM HYDROBROMIDE 20 MG PO TABS: 20.0000 mg | ORAL_TABLET | Freq: Every day | ORAL | 3 refills | Status: DC

## 2017-07-02 NOTE — Telephone Encounter (Signed)
Patient notified about medication change  

## 2017-07-02 NOTE — Telephone Encounter (Signed)
Copied from [place]. Topic: Quick Communication - See Telephone Encounter >> Jul 02, 2017  2:03 PM Bea Graff, NT wrote: CRM for notification. See Telephone encounter for: Patient needs his rx that was called in today switched to [facility]. His Celexa. Please call pt.  07/02/17.

## 2017-07-02 NOTE — Telephone Encounter (Signed)
Let's switch back to Citalopram

## 2017-07-02 NOTE — Telephone Encounter (Signed)
Routing to provider to advise.  

## 2017-07-02 NOTE — Telephone Encounter (Signed)
Patient was started on a new medication- went from Celexa to Lexapro. Patient states he felt sleepy and drunk after getting up. He feels dizzy, SOB- breathing fine-" just feels like not getting enough oxygen". Patient took his last dose this morning at 6 am.  Please advise. [phone] Patient is not having any other symptoms- no rash, no heart symptoms, swallowing -no throat issues- patient advised if allergic symptoms- call 911.

## 2017-07-03 ENCOUNTER — Other Ambulatory Visit: Payer: Self-pay

## 2017-07-03 MED ORDER — CITALOPRAM HYDROBROMIDE 20 MG PO TABS: 20.0000 mg | ORAL_TABLET | Freq: Every day | ORAL | 3 refills | Status: DC

## 2017-07-05 ENCOUNTER — Encounter: Payer: Self-pay | Admitting: Neurology

## 2017-07-05 ENCOUNTER — Ambulatory Visit (INDEPENDENT_AMBULATORY_CARE_PROVIDER_SITE_OTHER): Payer: BLUE CROSS/BLUE SHIELD | Admitting: Neurology

## 2017-07-05 VITALS — BP 129/78 | HR 72 | Ht 70.0 in | Wt 234.0 lb

## 2017-07-05 DIAGNOSIS — R51 Headache: Secondary | ICD-10-CM

## 2017-07-05 DIAGNOSIS — R0683 Snoring: Secondary | ICD-10-CM

## 2017-07-05 DIAGNOSIS — E669 Obesity, unspecified: Secondary | ICD-10-CM

## 2017-07-05 DIAGNOSIS — R519 Headache, unspecified: Secondary | ICD-10-CM

## 2017-07-05 DIAGNOSIS — Z820 Family history of epilepsy and other diseases of the nervous system: Secondary | ICD-10-CM

## 2017-07-05 DIAGNOSIS — G4719 Other hypersomnia: Secondary | ICD-10-CM | POA: Diagnosis not present

## 2017-07-05 NOTE — Progress Notes (Signed)
Subjective:    Patient ID: David Wong is a 31 y.o. male.  HPI     Star Age, MD, PhD [facility] [address], [address] [place], [place] [address]  Dear David Wong,   I saw your patient, David Wong, upon your kind request in my neurologic clinic today for initial consultation of his sleep disorder, in particular, concern for underlying obstructive sleep apnea. The patient is unaccompanied today. As you know, David Wong is a 31 year old right-handed gentleman with an underlying medical history of anxiety, depression, and obesity, who reports snoring and excessive daytime somnolence. I reviewed your office note from 06/29/2017.  His Epworth sleepiness score is 15 out of 24 today, fatigue score is 30 out of 63. He has a family history of sleep apnea. His father has sleep apnea and older brother has sleep apnea, both have CPAP machines. He lives at home with his parents. He is single and has no children. He works for a [employer]. Bedtime is around 10, wakeup time around 6:30 AM. He does not have night to night nocturia but has had the occasional dull frontal morning headaches. He denies telltale symptoms of restless leg syndrome her leg twitching at night. He has woken himself up with a startle or jerk. He does not smoke cigarettes any longer but uses nicotine vapor. He drinks alcohol occasionally, caffeine in the form of sodas and energy drinks, typically up to 4 servings per day.   His Past Medical History Is Significant For: Past Medical History:  Diagnosis Date  . Anxiety   . Depression     His Past Surgical History Is Significant For: Past Surgical History:  Procedure Laterality Date  . right pinky finger  2013   pins inserted    His Family History Is Significant For: Family History  Problem Relation Age of Onset  . Hyperlipidemia Mother   . Hypertension Mother   . Hyperlipidemia Father   . Diabetes Father   . Hypertension  Brother   . Arthritis Maternal Grandfather        RA  . Heart disease Paternal Grandfather     His Social History Is Significant For: Social History   Socioeconomic History  . Marital status: Single    Spouse name: None  . Number of children: None  . Years of education: None  . Highest education level: None  Social Needs  . Financial resource strain: None  . Food insecurity - worry: None  . Food insecurity - inability: None  . Transportation needs - medical: None  . Transportation needs - non-medical: None  Occupational History  . None  Tobacco Use  . Smoking status: Never Smoker  . Smokeless tobacco: Never Used  Substance and Sexual Activity  . Alcohol use: Yes    Alcohol/week: 0.0 oz    Comment: Rarely  . Drug use: No  . Sexual activity: Yes    Partners: Female    Comment: Fiance on birth control  Other Topics Concern  . None  Social History Narrative  . None    His Allergies Are:  No Known Allergies:   His Current Medications Are:  Outpatient Encounter Medications as of 07/05/2017  Medication Sig  . citalopram (CELEXA) 20 MG tablet Take 1 tablet (20 mg total) by mouth daily.   No facility-administered encounter medications on file as of 07/05/2017.   :  Review of Systems:  Out of a complete 14 point review of systems, all are reviewed  and negative with the exception of these symptoms as listed below: Review of Systems  Neurological:       Pt presents today to discuss his sleep. Pt has never had a sleep study. Pt does endorse snoring.  Epworth Sleepiness Scale 0= would never doze 1= slight chance of dozing 2= moderate chance of dozing 3= high chance of dozing  Sitting and reading: 2 Watching TV: 2 Sitting inactive in a public place (ex. Theater or meeting): 1 As a passenger in a car for an hour without a break: 3 Lying down to rest in the afternoon: 3 Sitting and talking to someone: 1 Sitting quietly after lunch (no alcohol): 2 In a car, while  stopped in traffic: 1 Total: 15     Objective:  Neurological Exam  Physical Exam Physical Examination:   Vitals:   07/05/17 1334  BP: 129/78  Pulse: 72   General Examination: The patient is a very pleasant 31 y.o. male in no acute distress. He appears well-developed and well-nourished and well groomed.   HEENT: Normocephalic, atraumatic, pupils are equal, round and reactive to light and accommodation. Extraocular tracking is good without limitation to gaze excursion or nystagmus noted. Normal smooth pursuit is noted. Hearing is grossly intact. Face is symmetric with normal facial animation and normal facial sensation. Speech is clear with no dysarthria noted. There is no hypophonia. There is no lip, neck/head, jaw or voice tremor. Neck is supple with full range of passive and active motion. There are no carotid bruits on auscultation. Oropharynx exam reveals: mild mouth dryness, good dental hygiene and moderate airway crowding, due to thicker soft palate and tonsils in place. Mallampati is class I. Tongue protrudes centrally and palate elevates symmetrically. Tonsils are 1+ to 2+ in size. Neck size is 17  1/8 inches. He has a mild to moderate overbite.   Chest: Clear to auscultation without wheezing, rhonchi or crackles noted.  Heart: S1+S2+0, regular and normal without murmurs, rubs or gallops noted.   Abdomen: Soft, non-tender and non-distended with normal bowel sounds appreciated on auscultation.  Extremities: There is no pitting edema in the distal lower extremities bilaterally. Pedal pulses are intact.  Skin: Warm and dry without trophic changes noted.  Musculoskeletal: exam reveals no obvious joint deformities, tenderness or joint swelling or erythema.   Neurologically:  Mental status: The patient is awake, alert and oriented in all 4 spheres. His immediate and remote memory, attention, language skills and fund of knowledge are appropriate. There is no evidence of aphasia,  agnosia, apraxia or anomia. Speech is clear with normal prosody and enunciation. Thought process is linear. Wong is normal and affect is normal.  Cranial nerves II - XII are as described above under HEENT exam. In addition: shoulder shrug is normal with equal shoulder height noted. Motor exam: Normal bulk, strength and tone is noted. There is no drift, resting tremor or rebound, slight postural tremor, no action tremor. Romberg is negative. Reflexes are 2+ throughout. Fine motor skills and coordination: intact with normal finger taps, normal hand movements, normal rapid alternating patting, normal foot taps and normal foot agility.  Cerebellar testing: No dysmetria or intention tremor on finger to nose testing. Heel to shin is unremarkable bilaterally. There is no truncal or gait ataxia.  Sensory exam: intact to light touch in the upper and lower extremities.  Gait, station and balance: He stands easily. No veering to one side is noted. No leaning to one side is noted. Posture is age-appropriate and stance  is narrow based. Gait shows normal stride length and normal pace. No problems turning are noted. Tandem walk is unremarkable. Intact toe and heel stance is noted.               Assessment and Plan:  In summary, David Wong is a very pleasant 31 y.o.-year old male with an underlying medical history of anxiety, depression, and obesity, whose history and physical exam are concerning for obstructive sleep apnea (OSA). I had a long chat with the patient about my findings and the diagnosis of OSA, its prognosis and treatment options. We talked about medical treatments, surgical interventions and non-pharmacological approaches. I explained in particular the risks and ramifications of untreated moderate to severe OSA, especially with respect to developing cardiovascular disease down the Road, including congestive heart failure, difficult to treat hypertension, cardiac arrhythmias, or stroke. Even type 2  diabetes has, in part, been linked to untreated OSA. Symptoms of untreated OSA include daytime sleepiness, memory problems, Wong irritability and Wong disorder such as depression and anxiety, lack of energy, as well as recurrent headaches, especially morning headaches. We talked about nicotine cessation, limiting caffeine intake, and trying to maintain a healthy lifestyle in general, as well as the importance of weight control. I encouraged the patient to eat healthy, exercise daily and keep well hydrated, to keep a scheduled bedtime and wake time routine, to not skip any meals and eat healthy snacks in between meals. I advised the patient not to drive when feeling sleepy. I recommended the following at this time: sleep study with potential positive airway pressure titration. (We will score hypopneas at 3%).   I explained the sleep test procedure to the patient and also outlined possible surgical and non-surgical treatment options of OSA, including the use of a custom-made dental device (which would require a referral to a specialist dentist or oral surgeon), upper airway surgical options, such as pillar implants, radiofrequency surgery, tongue base surgery, and UPPP (which would involve a referral to an ENT surgeon). Rarely, jaw surgery such as mandibular advancement may be considered.  I also explained the CPAP treatment option to the patient, who indicated that he would be willing to try CPAP if the need arises. I explained the importance of being compliant with PAP treatment, not only for insurance purposes but primarily to improve His symptoms, and for the patient's long term health benefit, including to reduce His cardiovascular risks. I answered all his questions today and the patient was in agreement. I would like to see him back after the sleep study is completed and encouraged him to call with any interim questions, concerns, problems or updates.   Thank you very much for allowing me to participate  in the care of this nice patient. If I can be of any further assistance to you please do not hesitate to call me at [phone].  Sincerely,   Star Age, MD, PhD

## 2017-07-05 NOTE — Patient Instructions (Signed)

## 2017-07-30 ENCOUNTER — Encounter: Payer: BLUE CROSS/BLUE SHIELD | Admitting: Unknown Physician Specialty

## 2017-08-29 ENCOUNTER — Ambulatory Visit (INDEPENDENT_AMBULATORY_CARE_PROVIDER_SITE_OTHER): Payer: BLUE CROSS/BLUE SHIELD | Admitting: Neurology

## 2017-08-29 DIAGNOSIS — E669 Obesity, unspecified: Secondary | ICD-10-CM

## 2017-08-29 DIAGNOSIS — G4719 Other hypersomnia: Secondary | ICD-10-CM

## 2017-08-29 DIAGNOSIS — R0683 Snoring: Secondary | ICD-10-CM

## 2017-08-29 DIAGNOSIS — R51 Headache: Secondary | ICD-10-CM

## 2017-08-29 DIAGNOSIS — G4733 Obstructive sleep apnea (adult) (pediatric): Secondary | ICD-10-CM

## 2017-08-29 DIAGNOSIS — Z820 Family history of epilepsy and other diseases of the nervous system: Secondary | ICD-10-CM

## 2017-08-29 DIAGNOSIS — R519 Headache, unspecified: Secondary | ICD-10-CM

## 2017-09-02 ENCOUNTER — Other Ambulatory Visit: Payer: Self-pay

## 2017-09-02 ENCOUNTER — Emergency Department
Admission: EM | Admit: 2017-09-02 | Discharge: 2017-09-02 | Disposition: A | Discharge status: Home / Self Care | Payer: BLUE CROSS/BLUE SHIELD | Attending: Emergency Medicine | Admitting: Emergency Medicine

## 2017-09-02 DIAGNOSIS — Z79899 Other long term (current) drug therapy: Secondary | ICD-10-CM | POA: Insufficient documentation

## 2017-09-02 DIAGNOSIS — R509 Fever, unspecified: Secondary | ICD-10-CM | POA: Diagnosis not present

## 2017-09-02 DIAGNOSIS — D509 Iron deficiency anemia, unspecified: Secondary | ICD-10-CM | POA: Diagnosis not present

## 2017-09-02 DIAGNOSIS — N4889 Other specified disorders of penis: Secondary | ICD-10-CM | POA: Insufficient documentation

## 2017-09-02 LAB — BASIC METABOLIC PANEL
Anion gap: 7 (ref 5–15)
BUN: 12 mg/dL (ref 6–20)
CO2: 22 mmol/L (ref 22–32)
Calcium: 8.7 mg/dL — ABNORMAL LOW (ref 8.9–10.3)
Chloride: 110 mmol/L (ref 101–111)
Creatinine, Ser: 1.12 mg/dL (ref 0.61–1.24)
GFR calc Af Amer: >60 mL/min (ref >60)
GFR calc non Af Amer: >60 mL/min (ref >60)
Glucose, Bld: 105 mg/dL — ABNORMAL HIGH (ref 65–99)
Potassium: 3.5 mmol/L (ref 3.5–5.1)
Sodium: 139 mmol/L (ref 135–145)

## 2017-09-02 LAB — URINALYSIS, COMPLETE (UACMP) WITH MICROSCOPIC
Bacteria, UA: NONE SEEN
Bilirubin Urine: NEGATIVE
Glucose, UA: NEGATIVE mg/dL
Hgb urine dipstick: NEGATIVE
Ketones, ur: NEGATIVE mg/dL
Nitrite: NEGATIVE
Protein, ur: NEGATIVE mg/dL
Specific Gravity, Urine: 1.026 (ref 1.005–1.030)
pH: 6 (ref 5.0–8.0)

## 2017-09-02 LAB — CBC WITH DIFFERENTIAL/PLATELET
Basophils Absolute: 0.1 10*3/uL (ref 0–0.1)
Basophils Relative: 1 %
Eosinophils Absolute: 0.2 10*3/uL (ref 0–0.7)
Eosinophils Relative: 2 %
HCT: 30.7 % — ABNORMAL LOW (ref 40.0–52.0)
Hemoglobin: 9.5 g/dL — ABNORMAL LOW (ref 13.0–18.0)
Lymphocytes Relative: 27 %
Lymphs Abs: 2.9 10*3/uL (ref 1.0–3.6)
MCH: 21.6 pg — ABNORMAL LOW (ref 26.0–34.0)
MCHC: 31 g/dL — ABNORMAL LOW (ref 32.0–36.0)
MCV: 69.7 fL — ABNORMAL LOW (ref 80.0–100.0)
Monocytes Absolute: 1.1 10*3/uL — ABNORMAL HIGH (ref 0.2–1.0)
Monocytes Relative: 10 %
Neutro Abs: 6.5 10*3/uL (ref 1.4–6.5)
Neutrophils Relative %: 60 %
Platelets: 248 10*3/uL (ref 150–440)
RBC: 4.4 MIL/uL (ref 4.40–5.90)
RDW: 17.2 % — ABNORMAL HIGH (ref 11.5–14.5)
WBC: 10.9 10*3/uL — ABNORMAL HIGH (ref 3.8–10.6)

## 2017-09-02 LAB — CHLAMYDIA/NGC RT PCR (ARMC ONLY)
Chlamydia Tr: NOT DETECTED
N gonorrhoeae: NOT DETECTED

## 2017-09-02 MED ORDER — FERROUS SULFATE ER 142 (45 FE) MG PO TBCR: 1.0000 | EXTENDED_RELEASE_TABLET | ORAL | 0 refills | Status: DC

## 2017-09-02 MED ORDER — CEPHALEXIN 500 MG PO CAPS: 500.0000 mg | ORAL_CAPSULE | Freq: Three times a day (TID) | ORAL | 0 refills | Status: AC

## 2017-09-02 NOTE — ED Provider Notes (Addendum)
[facility] Emergency Department Provider Note  ____________________________________________   I have reviewed the triage vital signs and the nursing notes. Where available I have reviewed prior notes and, if possible and indicated, outside hospital notes.    HISTORY  Chief Complaint Groin Swelling    HPI DONAL Wong is a 32 y.o. male  who presents today complaining of a swelling to his penis.  He states that he had a low-grade fever yesterday.  Denies any nausea vomiting.  Denies any penile discharge, no testicular pain or swelling, just noticed it today.  He feels a little discomfort when he urinates but only because of the pressure, he has no burning with his penile discharge.  He states that he has circumferential swelling around the shaft of the penis itself.  Did have this episode 1 time a years ago, was told that it might be because of an allergy to a protein drink.  No penile lesions noted, he has had no pain with urination.   Past Medical History:  Diagnosis Date  . Anxiety   . Depression     Patient Active Problem List   Diagnosis Date Noted  . Depression, major, single episode, in partial remission ([place]) 06/29/2017  . Fatigue 06/29/2017  . Sleep apnea 06/29/2017  . Anxiety 01/08/2015  . Clinical depression 01/08/2015  . Insomnia 01/08/2015    Past Surgical History:  Procedure Laterality Date  . right pinky finger  2013   pins inserted    Prior to Admission medications   Medication Sig Start Date End Date Taking? Authorizing Provider  citalopram (CELEXA) 20 MG tablet Take 1 tablet (20 mg total) by mouth daily. 07/03/17   Kathrine Haddock, NP    Allergies Patient has no known allergies.  Family History  Problem Relation Age of Onset  . Hyperlipidemia Mother   . Hypertension Mother   . Hyperlipidemia Father   . Diabetes Father   . Hypertension Brother   . Arthritis Maternal Grandfather        RA  . Heart disease Paternal  Grandfather     Social History Social History   Tobacco Use  . Smoking status: Never Smoker  . Smokeless tobacco: Never Used  Substance Use Topics  . Alcohol use: Yes    Alcohol/week: 0.0 oz    Comment: Rarely  . Drug use: No    Review of Systems Constitutional: Positive low-grade fever yesterday he states Eyes: No visual changes. ENT: No sore throat. No stiff neck no neck pain Cardiovascular: Denies chest pain. Respiratory: Denies shortness of breath. Gastrointestinal:   no vomiting.  No diarrhea.  No constipation. Genitourinary: Negative for dysuria. Musculoskeletal: Negative lower extremity swelling Skin: Negative for rash. Neurological: Negative for severe headaches, focal weakness or numbness.   ____________________________________________   PHYSICAL EXAM:  VITAL SIGNS: ED Triage Vitals  Enc Vitals Group     BP 09/02/17 1710 (!) 143/83     Pulse Rate 09/02/17 1710 (!) 104     Resp 09/02/17 1710 18     Temp 09/02/17 1710 99.1 F (37.3 C)     Temp Source 09/02/17 1710 Oral     SpO2 09/02/17 1710 99 %     Weight 09/02/17 1710 230 lb (104.3 kg)     Height 09/02/17 1710 5\' 10"  (1.778 m)     Head Circumference --      Peak Flow --      Pain Score 09/02/17 1721 7     Pain  Loc --      Pain Edu? --      Excl. in [place]? --     Constitutional: Alert and oriented. Well appearing and in no acute distress. Eyes: Conjunctivae are normal Head: Atraumatic HEENT: No congestion/rhinnorhea. Mucous membranes are moist.  Oropharynx non-erythematous Neck:   Nontender with no meningismus, no masses, no stridor Cardiovascular: Normal rate, regular rhythm. Grossly normal heart sounds.  Good peripheral circulation. Respiratory: Normal respiratory effort.  No retractions. Lungs CTAB. Abdominal: Soft and nontender. No distention. No guarding no rebound Back:  There is no focal tenderness or step off.  there is no midline tenderness there are no lesions noted. there is no CVA  tenderness GU: Normal testicular exam no pain or swelling no redness, penis itself is circumcised, there is circumferential mild swelling around the scar from his circumcision.  Is mildly tender.  The glans penis itself is normal in appearance.  There is no drainage or discharge.  There is very slight erythema noted.  There is no streaks from the area, the penis is otherwise normal appearance.  Certainly no lesions noted or herpetic or otherwise.  No significant lymphadenopathy noted. Musculoskeletal: No lower extremity tenderness, no upper extremity tenderness. No joint effusions, no DVT signs strong distal pulses no edema Neurologic:  Normal speech and language. No gross focal neurologic deficits are appreciated.  Skin:  Skin is warm, dry and intact. No rash noted. Psychiatric: Mood and affect are normal. Speech and behavior are normal.  ____________________________________________   LABS (all labs ordered are listed, but only abnormal results are displayed)  Labs Reviewed  CBC WITH DIFFERENTIAL/PLATELET - Abnormal; Notable for the following components:      Result Value   WBC 10.9 (*)    Hemoglobin 9.5 (*)    HCT 30.7 (*)    MCV 69.7 (*)    MCH 21.6 (*)    MCHC 31.0 (*)    RDW 17.2 (*)    Monocytes Absolute 1.1 (*)    All other components within normal limits  BASIC METABOLIC PANEL - Abnormal; Notable for the following components:   Glucose, Bld 105 (*)    Calcium 8.7 (*)    All other components within normal limits  URINALYSIS, COMPLETE (UACMP) WITH MICROSCOPIC - Abnormal; Notable for the following components:   Color, Urine YELLOW (*)    APPearance CLEAR (*)    Leukocytes, UA TRACE (*)    Squamous Epithelial / LPF 0-5 (*)    All other components within normal limits    Pertinent labs  results that were available during my care of the patient were reviewed by me and considered in my medical decision making (see chart for  details). ____________________________________________  EKG  I personally interpreted any EKGs ordered by me or triage  ____________________________________________  RADIOLOGY  Pertinent labs & imaging results that were available during my care of the patient were reviewed by me and considered in my medical decision making (see chart for details). If possible, patient and/or family made aware of any abnormal findings.  No results found. ____________________________________________    PROCEDURES  Procedure(s) performed: None  Procedures  Critical Care performed: None  ____________________________________________   INITIAL IMPRESSION / ASSESSMENT AND PLAN / ED COURSE  Pertinent labs & imaging results that were available during my care of the patient were reviewed by me and considered in my medical decision making (see chart for details).  Patient here with penile swelling unclear etiology could be allergic versus infectious.  Did discuss with Dr. Bernardo Heater of urology he and I discussed the patient's finding, he feels that the patient would benefit from Keflex for possible skin infection or atypical balanitis even though he is circumcised and close outpatient follow-up which we will perform.  Patient himself does not look acutely or significantly ill.  I am sending STI testing on his urine all this would be a very atypical presentation of an STI given that is really a penile complaint there is no evidence of discharge or systemic infection.  I also noted that he has a chronic appearing anemia, unclear why this is, we will have him follow closely with primary care if he has made aware of this.  I do not think further workup for it is indicated at this time.  Patient will follow closely with primary care and return precautions and follow-up of been given and understood.  He has no testicular symptoms at this time.  No scrotal symptoms at this time, abdomen is  benign. ----------------------------------------- 7:38 PM on 09/02/2017 -----------------------------------------  A long talk with the patient about his anemia which is microcytic and new since 2 years ago.  I did offer to do an HIV test here and further testing but he would prefer to go home.  It is Super Bowl Sunday, he would like to leave.  I did offer him a rectal exam but he would prefer to defer that as well.  He understands that there is some therefore limitations on my ability to workup his anemia however I do not think that it is indicated at this time for emergent workup as it does appear to be chronic.  In any event, he is asymptomatic with that if he has any rectal bleeding, high fevers, worsening swelling of his penis or other concerns, he is understanding of the need to return.  He will follow-up with PCP for anemia and urology for his mild swelling around his penis.    ____________________________________________   FINAL CLINICAL IMPRESSION(S) / ED DIAGNOSES  Final diagnoses:  None      This chart was dictated using voice recognition software.  Despite best efforts to proofread,  errors can occur which can change meaning.      Schuyler Amor, MD 09/02/17 Kathyrn Drown    Schuyler Amor, MD 09/02/17 [phone]

## 2017-09-02 NOTE — ED Triage Notes (Signed)
Pt states that he woke up with swelling to his penis this am and difficulty urinating, reports past history of this related to allergy in protein shakes, denies any recent protein shakes

## 2017-09-02 NOTE — Discharge Instructions (Signed)
It is not clear exactly why how the swelling in your penis we will treated with antibiotics in case it is an infection, he may consider also taking Benadryl at home as there certainly is a possibility it could be an allergic reaction, however, we would advise you not to drive on Benadryl.  We did offer to do a rectal exam here for your anemia you would prefer not to have that done and he would also prefer to defer HIV testing for your PCP, again no indication necessarily that you have HIV but we are concerned about your anemia.  We would ask you to follow closely with her primary care doctor for your anemia because it is something that needs to be worked up.  Please start taking the iron pills.  Also follow closely with urology.  If you have any new or worrisome symptoms, including increased pain or swelling or fever vomiting or bleeding from your bottom, lightheadedness, difficulty urinating, spreading redness or other concerns, please return to the emergency department.

## 2017-09-03 ENCOUNTER — Telehealth: Payer: Self-pay

## 2017-09-03 ENCOUNTER — Other Ambulatory Visit: Payer: Self-pay | Admitting: Neurology

## 2017-09-03 DIAGNOSIS — E669 Obesity, unspecified: Secondary | ICD-10-CM

## 2017-09-03 DIAGNOSIS — G4719 Other hypersomnia: Secondary | ICD-10-CM

## 2017-09-03 DIAGNOSIS — G4733 Obstructive sleep apnea (adult) (pediatric): Secondary | ICD-10-CM

## 2017-09-03 NOTE — Progress Notes (Signed)
Patient referred by Ms. Wicker, NP, seen by me on 07/05/17, diagnostic PSG on 08/29/17.   Please call and notify the patient that the recent sleep study showed moderate to severe obstructive sleep apnea, with a total AHI of 15.3/hour, REM AHI of 45.6/hour, supine AHI of 13/hour and O2 nadir of 84%. I recommend treatment for this in the form of CPAP. This will require a repeat sleep study for proper titration and mask fitting and correct monitoring of the oxygen saturations. Please explain to patient. I have placed an order in the chart. Thanks.  Star Age, MD, PhD [facility])

## 2017-09-03 NOTE — Telephone Encounter (Signed)
-----   Message from Star Age, MD sent at 09/03/2017  7:42 AM EST ----- Patient referred by Ms. Wicker, NP, seen by me on 07/05/17, diagnostic PSG on 08/29/17.   Please call and notify the patient that the recent sleep study showed moderate to severe obstructive sleep apnea, with a total AHI of 15.3/hour, REM AHI of 45.6/hour, supine AHI of 13/hour and O2 nadir of 84%. I recommend treatment for this in the form of CPAP. This will require a repeat sleep study for proper titration and mask fitting and correct monitoring of the oxygen saturations. Please explain to patient. I have placed an order in the chart. Thanks.  Star Age, MD, PhD [facility])

## 2017-09-03 NOTE — Procedures (Signed)
PATIENT'S NAME:  David Wong, [place] DOB:      1986-07-12      MR#:    [id number]     DATE OF RECORDING: 08/29/2017 REFERRING M.D.:  Kathrine Haddock, NP Study Performed:   Baseline Polysomnogram HISTORY: 32 year old man with a history of anxiety, depression, and obesity, who reports snoring and excessive daytime somnolence. His Epworth sleepiness score is 15 out of 24 today, fatigue score is 30 out of 63. The patient's weight 234 pounds with a height of 70 (inches), resulting in a BMI of 33.5 kg/m2. The patient's neck circumference measured 17 inches.  CURRENT MEDICATIONS: Celexa.   PROCEDURE:  This is a multichannel digital polysomnogram utilizing the Somnostar 11.2 system.  Electrodes and sensors were applied and monitored per AASM Specifications.   EEG, EOG, Chin and Limb EMG, were sampled at 200 Hz.  ECG, Snore and Nasal Pressure, Thermal Airflow, Respiratory Effort, CPAP Flow and Pressure, Oximetry was sampled at 50 Hz. Digital video and audio were recorded.      BASELINE STUDY  Lights Out was at 22:38 and Lights On at 05:26.  Total recording time (TRT) was 408.5 minutes, with a total sleep time (TST) of  391.5 minutes.   The patient's sleep latency was 9 minutes.  REM latency was 127.5 minutes, which is mildly delayed. The sleep efficiency was 95.8 %.     SLEEP ARCHITECTURE: WASO (Wake after sleep onset) was 7.5 minutes with minimal sleep fragmentation noted. There were 11.5 minutes in Stage N1, 141.5 minutes Stage N2, 134.5 minutes Stage N3 and 104 minutes in Stage REM.  The percentage of Stage N1 was 2.9%, Stage N2 was 36.1%, Stage N3 was 34.4%, which is increased, and Stage R (REM sleep) was 26.6%. The arousals were noted as: 39 were spontaneous, 8 were associated with PLMs, 14 were associated with respiratory events.  Audio and video analysis did not show any abnormal or unusual movements, behaviors, phonations or vocalizations. The patient took no bathroom breaks. Moderate to loud snoring was  noted. The EKG was in keeping with normal sinus rhythm (NSR).  RESPIRATORY ANALYSIS:  There were a total of 100 respiratory events:  10 obstructive apneas, 2 central apneas and 0 mixed apneas with a total of 12 apneas and an apnea index (AI) of 1.8 /hour. There were 88 hypopneas with a hypopnea index of 13.5 /hour. The patient also had 0 respiratory event related arousals (RERAs).      The total APNEA/HYPOPNEA INDEX (AHI) was 15.3/hour and the total RESPIRATORY DISTURBANCE INDEX was 15.3 /hour.  79 events occurred in REM sleep and 37 events in NREM. The REM AHI was 45.6 /hour, versus a non-REM AHI of 4.4. The patient spent 285.5 minutes of total sleep time in the supine position and 106 minutes in non-supine.. The supine AHI was 13.0 versus a non-supine AHI of 21.5.  OXYGEN SATURATION & C02:  The Wake baseline 02 saturation was 98%, with the lowest being 84%. Time spent below 89% saturation equaled 11 minutes.  PERIODIC LIMB MOVEMENTS: The patient had a total of 34 Periodic Limb Movements.  The Periodic Limb Movement (PLM) index was 5.2 and the PLM Arousal index was 1.2/hour.  Post-study, the patient indicated that sleep was better than usual.   IMPRESSION:  1. Obstructive Sleep Apnea (OSA)  RECOMMENDATIONS:  1. This study demonstrates moderate to severe obstructive sleep apnea, with a total AHI of 15.3/hour, REM AHI of 45.6/hour, supine AHI of 13/hour and O2 nadir of 84%. Treatment with  positive airway pressure in the form of CPAP is recommended. This will require a full night titration study to optimize therapy. Other treatment options may include avoidance of supine sleep position along with weight loss, upper airway or jaw surgery in selected patients or the use of an oral appliance in certain patients. ENT evaluation and/or consultation with a maxillofacial surgeon or dentist may be feasible in some instances.    2. Please note that untreated obstructive sleep apnea carries additional  perioperative morbidity. Patients with significant obstructive sleep apnea should receive perioperative PAP therapy and the surgeons and particularly the anesthesiologist should be informed of the diagnosis and the severity of the sleep disordered breathing. 3. The patient should be cautioned not to drive, work at heights, or operate dangerous or heavy equipment when tired or sleepy. Review and reiteration of good sleep hygiene measures should be pursued with any patient. 4. The patient will be seen in follow-up by Dr. Rexene Alberts at [facility] for discussion of the test results and further management strategies. The referring provider will be notified of the test results.  I certify that I have reviewed the entire raw data recording prior to the issuance of this report in accordance with the Standards of Accreditation of the American Academy of Sleep Medicine (AASM)   Star Age, MD, PhD Diplomat, American Board of Psychiatry and Neurology (Neurology and Sleep Medicine)

## 2017-09-03 NOTE — Telephone Encounter (Signed)
I called pt to discuss his sleep study results. Pt's cell phone does not have VM set up, and a male answered at his home number.  Will try his cell phone again another time.

## 2017-09-04 ENCOUNTER — Encounter: Payer: Self-pay | Admitting: Unknown Physician Specialty

## 2017-09-04 ENCOUNTER — Ambulatory Visit: Payer: BLUE CROSS/BLUE SHIELD | Admitting: Unknown Physician Specialty

## 2017-09-04 VITALS — BP 123/85 | HR 92 | Temp 98.3°F | Wt 230.8 lb

## 2017-09-04 DIAGNOSIS — N481 Balanitis: Secondary | ICD-10-CM | POA: Diagnosis not present

## 2017-09-04 DIAGNOSIS — D509 Iron deficiency anemia, unspecified: Secondary | ICD-10-CM | POA: Diagnosis not present

## 2017-09-04 LAB — CBC WITH DIFFERENTIAL/PLATELET
Hematocrit: 32.4 % — ABNORMAL LOW (ref 37.5–51.0)
Hemoglobin: 9.8 g/dL — ABNORMAL LOW (ref 13.0–17.7)
Lymphocytes Absolute: 2.9 10*3/uL (ref 0.7–3.1)
Lymphs: 31 %
MCH: 22.2 pg — ABNORMAL LOW (ref 26.6–33.0)
MCHC: 30.2 g/dL — ABNORMAL LOW (ref 31.5–35.7)
MCV: 74 fL — ABNORMAL LOW (ref 79–97)
MID (Absolute): 0.9 10*3/uL (ref 0.1–1.6)
MID: 10 %
Neutrophils Absolute: 5.5 10*3/uL (ref 1.4–7.0)
Neutrophils: 59 %
Platelets: 302 10*3/uL (ref 150–379)
RBC: 4.41 x10E6/uL (ref 4.14–5.80)
RDW: 16.7 % — ABNORMAL HIGH (ref 12.3–15.4)
WBC: 9.3 10*3/uL (ref 3.4–10.8)

## 2017-09-04 NOTE — Assessment & Plan Note (Addendum)
New problem.  Guiac negative stool but stool obtained scant.  H/H 9.8 and 32.4 with mild improvement from 2 days ago.  Refer to GI ASAP.  Start iron BID OTC.  Take with Vit C.

## 2017-09-04 NOTE — Progress Notes (Signed)
BP 123/85   Pulse 92   Temp 98.3 F (36.8 C) (Oral)   Wt 230 lb 12.8 oz (104.7 kg)   SpO2 99%   BMI 33.12 kg/m    Subjective:    Patient ID: David Wong, male    DOB: [birth year], 33 y.o.   MRN: [id number]  HPI: David Wong is a 32 y.o. male  Chief Complaint  Patient presents with  . ER Follow Up    anemia   Anemia Pt found to be anemic while in the ER 2 days ago..  Asked to f/u here.  H/H 9.5/30.7 and microcytic.  This is a significant change from 2 years ago.  - He does note blood in his stool.  No abdominal pain, nose bleeds, or blood in urine.  He is complaining of some fatigue.  No pica Reviewed ER notes and labs reviewed  Relevant past medical, surgical, family and social history reviewed and updated as indicated. Interim medical history since our last visit reviewed. Allergies and medications reviewed and updated.  Review of Systems  Constitutional: Negative.   HENT: Negative.   Respiratory: Negative.   Cardiovascular: Negative.   Genitourinary: Negative.   Musculoskeletal: Negative.   Psychiatric/Behavioral: Negative.     Per HPI unless specifically indicated above     Objective:    BP 123/85   Pulse 92   Temp 98.3 F (36.8 C) (Oral)   Wt 230 lb 12.8 oz (104.7 kg)   SpO2 99%   BMI 33.12 kg/m   Wt Readings from Last 3 Encounters:  09/04/17 230 lb 12.8 oz (104.7 kg)  09/02/17 230 lb (104.3 kg)  07/05/17 234 lb (106.1 kg)    Physical Exam  Constitutional: He is oriented to person, place, and time. He appears well-developed and well-nourished. No distress.  HENT:  Head: Normocephalic and atraumatic.  Eyes: Conjunctivae and lids are normal. Right eye exhibits no discharge. Left eye exhibits no discharge. No scleral icterus.  Neck: Normal range of motion. Neck supple. No JVD present. Carotid bruit is not present.  Cardiovascular: Normal rate, regular rhythm and normal heart sounds.  Pulmonary/Chest: Effort normal and breath sounds normal. No  respiratory distress.  Abdominal: Normal appearance. There is no splenomegaly or hepatomegaly.  Musculoskeletal: Normal range of motion.  Neurological: He is alert and oriented to person, place, and time.  Skin: Skin is warm, dry and intact. No rash noted. No pallor.  Psychiatric: He has a normal mood and affect. His behavior is normal. Judgment and thought content normal.   Scant stool.  Guiac negative  Results for orders placed or performed during the hospital encounter of 09/02/17  Chlamydia/NGC rt PCR  Result Value Ref Range   Specimen source GC/Chlam URINE, RANDOM    Chlamydia Tr NOT DETECTED NOT DETECTED   N gonorrhoeae NOT DETECTED NOT DETECTED  CBC with Differential  Result Value Ref Range   WBC 10.9 (H) 3.8 - 10.6 K/uL   RBC 4.40 4.40 - 5.90 MIL/uL   Hemoglobin 9.5 (L) 13.0 - 18.0 g/dL   HCT 30.7 (L) 40.0 - 52.0 %   MCV 69.7 (L) 80.0 - 100.0 fL   MCH 21.6 (L) 26.0 - 34.0 pg   MCHC 31.0 (L) 32.0 - 36.0 g/dL   RDW 17.2 (H) 11.5 - 14.5 %   Platelets 248 150 - 440 K/uL   Neutrophils Relative % 60 %   Neutro Abs 6.5 1.4 - 6.5 K/uL   Lymphocytes Relative 27 %  Lymphs Abs 2.9 1.0 - 3.6 K/uL   Monocytes Relative 10 %   Monocytes Absolute 1.1 (H) 0.2 - 1.0 K/uL   Eosinophils Relative 2 %   Eosinophils Absolute 0.2 0 - 0.7 K/uL   Basophils Relative 1 %   Basophils Absolute 0.1 0 - 0.1 K/uL  Basic metabolic panel  Result Value Ref Range   Sodium 139 135 - 145 mmol/L   Potassium 3.5 3.5 - 5.1 mmol/L   Chloride 110 101 - 111 mmol/L   CO2 22 22 - 32 mmol/L   Glucose, Bld 105 (H) 65 - 99 mg/dL   BUN 12 6 - 20 mg/dL   Creatinine, Ser 1.12 0.61 - 1.24 mg/dL   Calcium 8.7 (L) 8.9 - 10.3 mg/dL   GFR calc non Af Amer >60 >60 mL/min   GFR calc Af Amer >60 >60 mL/min   Anion gap 7 5 - 15  Urinalysis, Complete w Microscopic  Result Value Ref Range   Color, Urine YELLOW (A) YELLOW   APPearance CLEAR (A) CLEAR   Specific Gravity, Urine 1.026 1.005 - 1.030   pH 6.0 5.0 - 8.0    Glucose, UA NEGATIVE NEGATIVE mg/dL   Hgb urine dipstick NEGATIVE NEGATIVE   Bilirubin Urine NEGATIVE NEGATIVE   Ketones, ur NEGATIVE NEGATIVE mg/dL   Protein, ur NEGATIVE NEGATIVE mg/dL   Nitrite NEGATIVE NEGATIVE   Leukocytes, UA TRACE (A) NEGATIVE   RBC / HPF 0-5 0 - 5 RBC/hpf   WBC, UA 0-5 0 - 5 WBC/hpf   Bacteria, UA NONE SEEN NONE SEEN   Squamous Epithelial / LPF 0-5 (A) NONE SEEN   Mucus PRESENT    Hyaline Casts, UA PRESENT    Ca Oxalate Crys, UA PRESENT       Assessment & Plan:   Problem List Items Addressed This Visit      Unprioritized   Iron deficiency anemia - Primary    New problem.  Guiac negative stool but stool obtained scant.  H/H 9.8 and 32.4 with mild improvement from 2 days ago.  Refer to GI ASAP.  Start iron BID OTC.  Take with Vit C.        Relevant Orders   Anemia panel   CBC With Differential/Platelet   Ambulatory referral to Gastroenterology       Follow up plan: Return if symptoms worsen or fail to improve.

## 2017-09-05 LAB — ANEMIA PANEL
Ferritin: 7 ng/mL — ABNORMAL LOW (ref 30–400)
Folate, Hemolysate: 443.6 ng/mL
Folate, RBC: 1445 ng/mL (ref >498)
Hematocrit: 30.7 % — ABNORMAL LOW (ref 37.5–51.0)
Iron Saturation: 5 % — CL (ref 15–55)
Iron: 19 ug/dL — ABNORMAL LOW (ref 38–169)
Retic Ct Pct: 1.7 % (ref 0.6–2.6)
Total Iron Binding Capacity: 405 ug/dL (ref 250–450)
UIBC: 386 ug/dL — ABNORMAL HIGH (ref 111–343)
Vitamin B-12: 397 pg/mL (ref 232–1245)

## 2017-09-06 NOTE — Telephone Encounter (Signed)
I called David Wong. I advised David Wong that Dr. Rexene Alberts reviewed their sleep study results and found that David Wong has moderate to severe osa with an O2 nadir of 84% and recommends that David Wong be treated with a cpap. Dr. Rexene Alberts recommends that David Wong return for a repeat sleep study in order to properly titrate the cpap and ensure a good mask fit. David Wong is agreeable to returning for a titration study. I advised David Wong that our sleep lab will file with David Wong's insurance and call David Wong to schedule the sleep study when we hear back from the David Wong's insurance regarding coverage of this sleep study. David Wong verbalized understanding of results. David Wong had no questions at this time but was encouraged to call back if questions arise.

## 2017-09-10 ENCOUNTER — Ambulatory Visit (INDEPENDENT_AMBULATORY_CARE_PROVIDER_SITE_OTHER): Payer: BLUE CROSS/BLUE SHIELD | Admitting: Gastroenterology

## 2017-09-10 ENCOUNTER — Encounter: Payer: Self-pay | Admitting: Gastroenterology

## 2017-09-10 VITALS — BP 130/82 | HR 92 | Temp 98.5°F | Ht 70.0 in | Wt 233.0 lb

## 2017-09-10 DIAGNOSIS — D509 Iron deficiency anemia, unspecified: Secondary | ICD-10-CM | POA: Diagnosis not present

## 2017-09-10 MED ORDER — PEG 3350-KCL-NA BICARB-NACL 420 G PO SOLR
4000.0000 mL | Freq: Once | ORAL | 0 refills | Status: AC
Start: 2017-09-10 — End: 2017-09-10

## 2017-09-10 NOTE — Addendum Note (Signed)
Addended by: Peggye Ley on: 09/10/2017 02:57 PM   Modules accepted: Orders

## 2017-09-10 NOTE — Progress Notes (Signed)
Jonathon Bellows MD, MRCP(U.K) [address]  [place]  [place], [place] [address]  Main: [phone]  Fax: [phone]   Gastroenterology Consultation  Referring Provider:     Kathrine Haddock, NP Primary Care Physician:  Kathrine Haddock, NP Primary Gastroenterologist:  Dr. Jonathon Bellows  Reason for Consultation:     Iron deficiency anemia         HPI:   David Wong is a 32 y.o. y/o male referred for consultation & management  by Dr. Kathrine Haddock, NP.    He has been referred for iron deficiency anemia. Hb was normal 2 years back. Sudden drop noted when checked 8 days back with Hb 9.5 grams with MCV 69. Labs were done as routine. Denies any NSAID use   Iron/TIBC/Ferritin/ %Sat    Component Value Date/Time   IRON 19 (L) 09/04/2017 1541   TIBC 405 09/04/2017 1541   FERRITIN 7 (L) 09/04/2017 1541   IRONPCTSAT 5 (LL) 09/04/2017 1541   urine analysis negative.   Rectal bleeding: yes- since a few months , in the toilet bowl , bright red, no diarrhea , no hard stools, no weight loss  Nose bleeds: no  Hematemesis or hemoptysis : no  Blood in urine : no   Doing well otherwise, eating well/.     Past Medical History:  Diagnosis Date  . Anxiety   . Depression     Past Surgical History:  Procedure Laterality Date  . right pinky finger  2013   pins inserted    Prior to Admission medications   Medication Sig Start Date End Date Taking? Authorizing Provider  citalopram (CELEXA) 20 MG tablet Take 1 tablet (20 mg total) by mouth daily. 07/03/17  Yes Kathrine Haddock, NP  Ferrous Sulfate (SLOW FE) 142 (45 Fe) MG TBCR Take 1 tablet by mouth every other day. 09/02/17  Yes Schuyler Amor, MD  cephALEXin (KEFLEX) 500 MG capsule Take 1 capsule (500 mg total) by mouth 3 (three) times daily for 10 days. Patient not taking: Reported on 09/04/2017 09/02/17 09/12/17  Schuyler Amor, MD    Family History  Problem Relation Age of Onset  . Hyperlipidemia Mother   . Hypertension Mother     . Hyperlipidemia Father   . Diabetes Father   . Hypertension Brother   . Arthritis Maternal Grandfather        RA  . Heart disease Paternal Grandfather      Social History   Tobacco Use  . Smoking status: Never Smoker  . Smokeless tobacco: Never Used  Substance Use Topics  . Alcohol use: Yes    Alcohol/week: 0.0 oz    Comment: Rarely  . Drug use: No    Allergies as of 09/10/2017  . (No Known Allergies)    Review of Systems:    All systems reviewed and negative except where noted in HPI.   Physical Exam:  BP 130/82   Pulse 92   Temp 98.5 F (36.9 C) (Oral)   Ht 5\' 10"  (1.778 m)   Wt 233 lb (105.7 kg)   BMI 33.43 kg/m  No LMP for male patient. Psych:  Alert and cooperative. Normal mood and affect. General:   Alert,  Well-developed, well-nourished, pleasant and cooperative in NAD Head:  Normocephalic and atraumatic. Eyes:  Sclera clear, no icterus.   Conjunctiva pink. Ears:  Normal auditory acuity. Nose:  No deformity, discharge, or lesions. Mouth:  No deformity or lesions,oropharynx pink & moist. Neck:  Supple;  no masses or thyromegaly. Lungs:  Respirations even and unlabored.  Clear throughout to auscultation.   No wheezes, crackles, or rhonchi. No acute distress. Heart:  Regular rate and rhythm; no murmurs, clicks, rubs, or gallops. Abdomen:  Normal bowel sounds.  No bruits.  Soft, non-tender and non-distended without masses, hepatosplenomegaly or hernias noted.  No guarding or rebound tenderness.    Msk:  Symmetrical without gross deformities. Good, equal movement & strength bilaterally. Skin:  Intact without significant lesions or rashes. No jaundice. Lymph Nodes:  No significant cervical adenopathy. Psych:  Alert and cooperative. Normal mood and affect.  Imaging Studies: No results found.  Assessment and Plan:   David Wong is a 32 y.o. y/o male has been referred for iron deficiency anemia. Some rectal bleeding .    Plan  1. EGD+colonosocpy and  if negative will need capsule study of the small bowel  2. B12,folate,celiac serology    I have discussed alternative options, risks & benefits,  which include, but are not limited to, bleeding, infection, perforation,respiratory complication & drug reaction.  The patient agrees with this plan & written consent will be obtained.    Follow up in 6 weeks   Dr Jonathon Bellows MD,MRCP(U.K)

## 2017-09-10 NOTE — Addendum Note (Signed)
Addended by: Peggye Ley on: 09/10/2017 02:43 PM   Modules accepted: Orders, SmartSet

## 2017-09-19 ENCOUNTER — Ambulatory Visit
Admission: RE | Admit: 2017-09-19 | Payer: BLUE CROSS/BLUE SHIELD | Source: Ambulatory Visit | Admitting: Gastroenterology

## 2017-09-19 ENCOUNTER — Telehealth: Payer: Self-pay | Admitting: Gastroenterology

## 2017-09-19 ENCOUNTER — Encounter: Admission: RE | Payer: Self-pay | Source: Ambulatory Visit

## 2017-09-19 ENCOUNTER — Telehealth: Payer: Self-pay

## 2017-09-19 SURGERY — COLONOSCOPY WITH PROPOFOL
Anesthesia: General

## 2017-09-19 NOTE — Telephone Encounter (Signed)
Patients family member stated that she did not know if he wanted to reschedule procedure.  Procedure has been canceled.  Thanks [employer]

## 2017-09-19 NOTE — Telephone Encounter (Signed)
Patient returned call in reference to rescheduling his colonoscopy.  He stated that he did not wish to reschedule because he could not tolerate the bowel prep it made him feel nauseated and full.  When asked about the stool, he said it was like water.  I explained to him that his stool is supposed to be like water this is what the bowel prep does to clean the colon.  He did not wish to go through with it.

## 2017-09-19 NOTE — Telephone Encounter (Signed)
Pt called stating his prep did not work per DR. Vicente Males pt needs to be rescheduled for colonoscopy

## 2017-10-05 ENCOUNTER — Other Ambulatory Visit: Payer: Self-pay

## 2017-10-05 MED ORDER — CITALOPRAM HYDROBROMIDE 20 MG PO TABS: 20.0000 mg | ORAL_TABLET | Freq: Every day | ORAL | 0 refills | Status: DC

## 2017-10-05 NOTE — Telephone Encounter (Signed)
Patient last seen 09/14/17 for hospital f/up.

## 2017-10-10 ENCOUNTER — Ambulatory Visit (INDEPENDENT_AMBULATORY_CARE_PROVIDER_SITE_OTHER): Payer: BLUE CROSS/BLUE SHIELD | Admitting: Neurology

## 2017-10-10 DIAGNOSIS — G4733 Obstructive sleep apnea (adult) (pediatric): Secondary | ICD-10-CM

## 2017-10-10 DIAGNOSIS — E669 Obesity, unspecified: Secondary | ICD-10-CM

## 2017-10-10 DIAGNOSIS — G4719 Other hypersomnia: Secondary | ICD-10-CM

## 2017-10-16 ENCOUNTER — Telehealth: Payer: Self-pay

## 2017-10-16 ENCOUNTER — Other Ambulatory Visit: Payer: Self-pay | Admitting: Neurology

## 2017-10-16 DIAGNOSIS — G4733 Obstructive sleep apnea (adult) (pediatric): Secondary | ICD-10-CM

## 2017-10-16 NOTE — Telephone Encounter (Signed)
I called pt discuss his sleep study results, a woman claiming to be his wife answered the home phone and said he would be back from work around [place] today. Pt's wife is not listed on pt's DPR. Will try pt again around 5pm.

## 2017-10-16 NOTE — Progress Notes (Signed)
Patient referred by Ms. Wicker, NP, seen by me on 07/05/17, diagnostic PSG on 08/29/17. Patient had a CPAP titration study on 10/10/17.  Please call and inform patient that I have entered an order for treatment with positive airway pressure (PAP) treatment for obstructive sleep apnea (OSA). He did well during the latest sleep study with CPAP. We will, therefore, arrange for a machine for home use through a DME (durable medical equipment) company of His choice; and I will see the patient back in follow-up in about 10 weeks. Please also explain to the patient that I will be looking out for compliance data, which can be downloaded from the machine (stored on an SD card, that is inserted in the machine) or via remote access through a modem, that is built into the machine. At the time of the followup appointment we will discuss sleep study results and how it is going with PAP treatment at home. Please advise patient to bring His machine at the time of the first FU visit, even though this is cumbersome. Bringing the machine for every visit after that will likely not be needed, but often helps for the first visit to troubleshoot if needed. Please re-enforce the importance of compliance with treatment and the need for [place] to monitor compliance data - often an insurance requirement and actually good feedback for the patient as far as how they are doing.  Also remind patient, that any interim PAP machine or mask issues should be first addressed with the DME company, as they can often help better with technical and mask fit issues. Please ask if patient has a preference regarding DME company.  Please also make sure, the patient has a follow-up appointment with me in about 10 weeks from the setup date, thanks. May see one of our nurse practitioners if needed for proper timing of the FU appointment.  Please fax or rout report to the referring provider. Thanks,   Star Age, MD, PhD [facility])

## 2017-10-16 NOTE — Telephone Encounter (Signed)
-----   Message from Star Age, MD sent at 10/16/2017  8:12 AM EDT ----- Patient referred by Ms. Wicker, NP, seen by me on 07/05/17, diagnostic PSG on 08/29/17. Patient had a CPAP titration study on 10/10/17.  Please call and inform patient that I have entered an order for treatment with positive airway pressure (PAP) treatment for obstructive sleep apnea (OSA). He did well during the latest sleep study with CPAP. We will, therefore, arrange for a machine for home use through a DME (durable medical equipment) company of His choice; and I will see the patient back in follow-up in about 10 weeks. Please also explain to the patient that I will be looking out for compliance data, which can be downloaded from the machine (stored on an SD card, that is inserted in the machine) or via remote access through a modem, that is built into the machine. At the time of the followup appointment we will discuss sleep study results and how it is going with PAP treatment at home. Please advise patient to bring His machine at the time of the first FU visit, even though this is cumbersome. Bringing the machine for every visit after that will likely not be needed, but often helps for the first visit to troubleshoot if needed. Please re-enforce the importance of compliance with treatment and the need for [place] to monitor compliance data - often an insurance requirement and actually good feedback for the patient as far as how they are doing.  Also remind patient, that any interim PAP machine or mask issues should be first addressed with the DME company, as they can often help better with technical and mask fit issues. Please ask if patient has a preference regarding DME company.  Please also make sure, the patient has a follow-up appointment with me in about 10 weeks from the setup date, thanks. May see one of our nurse practitioners if needed for proper timing of the FU appointment.  Please fax or rout report to the referring provider.  Thanks,   Star Age, MD, PhD [facility])

## 2017-10-16 NOTE — Procedures (Signed)
PATIENT'S NAME:  David Wong, David Wong DOB:      1985/08/16      MR#:    [id number]     DATE OF RECORDING: 10/10/2017 REFERRING M.D.:  Kathrine Haddock, NP Study Performed:   CPAP  Titration HISTORY: 32 year old man with a history of anxiety, depression, and obesity, who returns for a CPAP titration study after a previous baseline done on 08/29/2017 showed an AHI of 15.3, REM AHI of 45.6, supine AHI of 13, and a nadir of 84%. The patient endorsed the Epworth Sleepiness Scale at 15/24. The patient's weight 234 pounds with a height of 70 (inches), resulting in a BMI of 33.5 kg/m2. The patient's neck circumference measured 17 inches.  CURRENT MEDICATIONS: Celexa.  PROCEDURE:  This is a multichannel digital polysomnogram utilizing the SomnoStar 11.2 system.  Electrodes and sensors were applied and monitored per AASM Specifications.   EEG, EOG, Chin and Limb EMG, were sampled at 200 Hz.  ECG, Snore and Nasal Pressure, Thermal Airflow, Respiratory Effort, CPAP Flow and Pressure, Oximetry was sampled at 50 Hz. Digital video and audio were recorded.      The patient was fitted with an [place] FFM, size small, after starting with nasal pillows. CPAP was initiated at 5 cmH20 with heated humidity per AASM split night standards and pressure was advanced to 11 cmH20 because of hypopneas, apneas and desaturations.  At a PAP pressure of 11 cmH20, there was a reduction of the AHI to 0/hour with supine REM sleep achieved and O2 nadir of 94%.   Lights Out was at 22:08 and Lights On at 05:32. Total recording time (TRT) was 444 minutes, with a total sleep time (TST) of 337 minutes. The patient's sleep latency was 61.5 minutes, which is delayed. REM latency was 336.5 minutes, which is markedly delayed. The sleep efficiency was 75.9 %.    SLEEP ARCHITECTURE: WASO (Wake after sleep onset) was 44 minutes with one longer period of wakefulness.  There were 14 minutes in Stage N1, 215 minutes Stage N2, 63.5 minutes Stage N3 and  44.5 minutes in Stage REM.  The percentage of Stage N1 was 4.2%, Stage N2 was 63.8%, which is increased, Stage N3 was 18.8%, which is normal, and Stage R (REM sleep) was 13.2%, which is reduced. The arousals were noted as: 28 were spontaneous, 5 were associated with PLMs, 25 were associated with respiratory events.  Audio and video analysis did not show any abnormal or unusual movements, behaviors, phonations or vocalizations. The patient took no bathroom breaks. The EKG was in keeping with normal sinus rhythm (NSR).  RESPIRATORY ANALYSIS:  There was a total of 82 respiratory events: 2 obstructive apneas, 56 central apneas and 0 mixed apneas with a total of 58 apneas and an apnea index (AI) of 10.3 /hour. There were 24 hypopneas with a hypopnea index of 4.3/hour. The patient also had 0 respiratory event related arousals (RERAs).      The total APNEA/HYPOPNEA INDEX  (AHI) was 14.6 /hour and the total RESPIRATORY DISTURBANCE INDEX was 14.6 .hour  1 events occurred in REM sleep and 81 events in NREM. The REM AHI was 1.3 /hour versus a non-REM AHI of 16.6 /hour.  The patient spent 337 minutes of total sleep time in the supine position and 0 minutes in non-supine. The supine AHI was 14.6, versus a non-supine AHI of 0.0.  OXYGEN SATURATION & C02:  The baseline 02 saturation was 99%, with the lowest being 76%. Time spent below 89% saturation equaled  8 minutes.  PERIODIC LIMB MOVEMENTS: The patient had a total of 30 Periodic Limb Movements. The Periodic Limb Movement (PLM) index was 5.3 and the PLM Arousal index was .9 /hour.  Post-study, the patient indicated that sleep was better than usual.   IMPRESSION:   1. Obstructive Sleep Apnea (OSA)   RECOMMENDATIONS:   1. This study demonstrates moderate to severe obstructive sleep apnea, with a total AHI of 15.3/hour, REM AHI of 45.6/hour, supine AHI of 13/hour and O2 nadir of 84%. Treatment with positive airway pressure in the form of CPAP is recommended.  This will require a full night titration study to optimize therapy. Other treatment options may include avoidance of supine sleep position along with weight loss, upper airway or jaw surgery in selected patients or the use of an oral appliance in certain patients. ENT evaluation and/or consultation with a maxillofacial surgeon or dentist may be feasible in some instances.    2. Please note that untreated obstructive sleep apnea carries additional perioperative morbidity. Patients with significant obstructive sleep apnea should receive perioperative PAP therapy and the surgeons and particularly the anesthesiologist should be informed of the diagnosis and the severity of the sleep disordered breathing. 3. The patient should be cautioned not to drive, work at heights, or operate dangerous or heavy equipment when tired or sleepy. Review and reiteration of good sleep hygiene measures should be pursued with any patient. 4. The patient will be seen in follow-up by Dr. Rexene Alberts at [facility] for discussion of the test results and further management strategies. The referring provider will be notified of the test results.   I certify that I have reviewed the entire raw data recording prior to the issuance of this report in accordance with the Standards of Accreditation of the American Academy of Sleep Medicine (AASM)     Star Age, MD, PhD Diplomat, American Board of Psychiatry and Neurology (Neurology and Sleep Medicine)

## 2017-10-16 NOTE — Telephone Encounter (Signed)
I called pt. I advised pt that Dr. Rexene Alberts reviewed their sleep study results and found that pt did well with the cpap during his latest sleep study. Dr. Rexene Alberts recommends that pt start a cpap at home. I reviewed PAP compliance expectations with the pt. Pt is agreeable to starting a CPAP. I advised pt that an order will be sent to a DME, [facility], and [facility] will call the pt within about one week after they file with the pt's insurance. [facility] will show the pt how to use the machine, fit for masks, and troubleshoot the CPAP if needed. A follow up appt was made for insurance purposes with Dr. Rexene Alberts on 01/15/18 at 2:00pm. Pt verbalized understanding to arrive 15 minutes early and bring their CPAP. A letter with all of this information in it will be mailed to the pt as a reminder. I verified with the pt that the address we have on file is correct. Pt verbalized understanding of results. Pt had no questions at this time but was encouraged to call back if questions arise.

## 2017-11-26 NOTE — Telephone Encounter (Signed)
Received this notice from [facility]: "I called on 10/22/2017 to go over financials and scheduling, no answer LVM. On 10/23/2017, Heather spoke with the patient and went over used bundle options, he advised he would need some time to think about this and give [place] a call back. On 11/26/2017 Arrissa called to follow up, no answer LVM. I am voiding the sales order, just wanted to let you know. "

## 2018-01-02 NOTE — Telephone Encounter (Signed)
I called pt. He reports that he is currently unable to afford the cpap, but is saving money towards buying it. He plans on starting it in the future. Pt asked that his 01/15/18 appt with Dr. Rexene Alberts be cancelled for now, and when he starts his cpap, he will call us back to reschedule that appt. I discussed the risks and ramifications of untreated sleep apnea with the pt. Pt verbalized understanding.

## 2018-01-14 ENCOUNTER — Other Ambulatory Visit: Payer: Self-pay

## 2018-01-14 MED ORDER — CITALOPRAM HYDROBROMIDE 20 MG PO TABS: 20.0000 mg | ORAL_TABLET | Freq: Every day | ORAL | 0 refills | Status: DC

## 2018-01-15 ENCOUNTER — Ambulatory Visit: Payer: Self-pay | Admitting: Neurology

## 2018-03-17 ENCOUNTER — Other Ambulatory Visit: Payer: Self-pay

## 2018-03-17 ENCOUNTER — Ambulatory Visit
Admission: EM | Admit: 2018-03-17 | Discharge: 2018-03-17 | Disposition: A | Discharge status: Home / Self Care | Payer: BLUE CROSS/BLUE SHIELD | Attending: Internal Medicine | Admitting: Internal Medicine

## 2018-03-17 DIAGNOSIS — J029 Acute pharyngitis, unspecified: Secondary | ICD-10-CM | POA: Diagnosis not present

## 2018-03-17 HISTORY — DX: Anemia, unspecified: D64.9

## 2018-03-17 LAB — RAPID STREP SCREEN (MED CTR MEBANE ONLY): Streptococcus, Group A Screen (Direct): NEGATIVE

## 2018-03-17 MED ORDER — AMOXICILLIN 875 MG PO TABS: 875.0000 mg | ORAL_TABLET | Freq: Two times a day (BID) | ORAL | 0 refills | Status: DC

## 2018-03-17 NOTE — ED Triage Notes (Addendum)
Pt with 2 days of sore throat and throat swelling. Started out with pressure in the ears. Pain 9/10. Uvula enlarged

## 2018-03-17 NOTE — ED Provider Notes (Signed)
[facility]    CSN: [id number] Arrival date & time: 03/17/18  1234     History   Chief Complaint Chief Complaint  Patient presents with  . Sore Throat    HPI David Wong is a 32 y.o. male.   HPI  32 year old male presents with 2 days history of sore throat and swelling of his tonsils.  Started out with pressure in his ears.  This when he looks at his throat he noticed that his uvula appears enlarged.  Has had no fever or chills.  States that it hurts to swallow.       Past Medical History:  Diagnosis Date  . Anemia   . Anxiety   . Depression     Patient Active Problem List   Diagnosis Date Noted  . Iron deficiency anemia 09/04/2017  . 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 Fatigue 06/29/2017  . Sleep apnea 06/29/2017  . Anxiety 01/08/2015  . Clinical depression 01/08/2015  . Insomnia 01/08/2015    Past Surgical History:  Procedure Laterality Date  . right pinky finger  2013   pins inserted       Home Medications    Prior to Admission medications   Medication Sig Start Date End Date Taking? Authorizing Provider  amoxicillin (AMOXIL) 875 MG tablet Take 1 tablet (875 mg total) by mouth 2 (two) times daily. 03/17/18   Lorin Picket, PA-C  citalopram (CELEXA) 20 MG tablet Take 1 tablet (20 mg total) by mouth daily. 01/14/18   Valerie Roys, DO    Family History Family History  Problem Relation Age of Onset  . Hyperlipidemia Mother   . Hypertension Mother   . Hyperlipidemia Father   . Diabetes Father   . Hypertension Brother   . Arthritis Maternal Grandfather        RA  . Heart disease Paternal Grandfather     Social History Social History   Tobacco Use  . Smoking status: Never Smoker  . Smokeless tobacco: Never Used  Substance Use Topics  . Alcohol use: Yes    Alcohol/week: 0.0 standard drinks    Comment: Rarely  . Drug use: No     Allergies   Patient has no known  allergies.   Review of Systems Review of Systems  Constitutional: Positive for activity change. Negative for appetite change, chills, fatigue and fever.  HENT: Positive for congestion and sore throat.   Respiratory: Negative for cough.   All other systems reviewed and are negative.    Physical Exam Triage Vital Signs ED Triage Vitals  Enc Vitals Group     BP 03/17/18 1246 136/78     Pulse Rate 03/17/18 1246 (!) 120     Resp 03/17/18 1246 18     Temp 03/17/18 1246 98.3 F (36.8 C)     Temp Source 03/17/18 1246 Oral     SpO2 03/17/18 1246 100 %     Weight 03/17/18 1247 225 lb (102.1 kg)     Height 03/17/18 1247 5\' 9"  (1.753 m)     Head Circumference --      Peak Flow --      Pain Score 03/17/18 1245 9     Pain Loc --      Pain Edu? --      Excl. in [place]? --    No data found.  Updated Vital Signs BP 136/78 (BP Location: Right Arm)   Pulse (!) 120  Temp 98.3 F (36.8 C) (Oral)   Resp 18   Ht 5\' 9"  (1.753 m)   Wt 225 lb (102.1 kg)   SpO2 100%   BMI 33.23 kg/m   Visual Acuity Right Eye Distance:   Left Eye Distance:   Bilateral Distance:    Right Eye Near:   Left Eye Near:    Bilateral Near:     Physical Exam  Constitutional: He is oriented to person, place, and time. He appears well-developed and well-nourished.  Non-toxic appearance. He does not appear ill. No distress.  HENT:  Head: Normocephalic.  Right Ear: Hearing, tympanic membrane and ear canal normal.  Left Ear: Hearing, tympanic membrane and ear canal normal.  Mouth/Throat: Uvula is midline and mucous membranes are normal. No uvula swelling. Posterior oropharyngeal edema and posterior oropharyngeal erythema present. No oropharyngeal exudate or tonsillar abscesses. Tonsils are 3+ on the right. Tonsils are 2+ on the left. No tonsillar exudate.  Eyes: Pupils are equal, round, and reactive to light.  Neck: Normal range of motion. Neck supple.  Pulmonary/Chest: Effort normal and breath sounds normal.   Lymphadenopathy:    He has no cervical adenopathy.  Neurological: He is alert and oriented to person, place, and time.  Skin: Skin is warm and dry.  Psychiatric: He has a normal mood and affect. His behavior is normal.  Nursing note and vitals reviewed.    UC Treatments / Results  Labs (all labs ordered are listed, but only abnormal results are displayed) Labs Reviewed  RAPID STREP SCREEN ([facility] ONLY)  CULTURE, GROUP A STREP [facility])    EKG None  Radiology No results found.  Procedures Procedures (including critical care time)  Medications Ordered in UC Medications - No data to display  Initial Impression / Assessment and Plan / UC Course  I have reviewed the triage vital signs and the nursing notes.  Pertinent labs & imaging results that were available during my care of the patient were reviewed by me and considered in my medical decision making (see chart for details).     Plan: 1. Test/x-ray results and diagnosis reviewed with patient 2. rx as per orders; risks, benefits, potential side effects reviewed with patient 3. Recommend supportive treatment with warm salt water gargles as necessary for comfort.  Recommend using ibuprofen for anti-inflammatory.  Though the patient has a negative rapid strep test today based on his physical examination empirically start him on amoxicillin 875 twice daily.  Cultures will be available in 48 hours.  If they are negative he will discontinue the use of the amoxicillin.  Told him that this could be a viral process but to be on the safe side I will start him early on the antibiotic 4. F/u prn if symptoms worsen or don't improve  Final Clinical Impressions(s) / UC Diagnoses   Final diagnoses:  Pharyngitis, unspecified etiology     Discharge Instructions     Cultures will be available in 48 hours. If You run high fevers and/or are not improving despite the antibiotics recommend going to the emergency room   ED  Prescriptions    Medication Sig Dispense Auth. Provider   amoxicillin (AMOXIL) 875 MG tablet Take 1 tablet (875 mg total) by mouth 2 (two) times daily. 20 tablet Lorin Picket, PA-C     Controlled Substance Prescriptions [place] Controlled Substance Registry consulted? Not Applicable   Lorin Picket, PA-C 03/17/18 1628

## 2018-03-17 NOTE — Discharge Instructions (Signed)
Cultures will be available in 48 hours. If You run high fevers and/or are not improving despite the antibiotics recommend going to the emergency room

## 2018-03-20 LAB — CULTURE, GROUP A STREP (THRC)

## 2018-04-10 DIAGNOSIS — L509 Urticaria, unspecified: Secondary | ICD-10-CM | POA: Diagnosis not present

## 2018-04-10 DIAGNOSIS — T781XXA Other adverse food reactions, not elsewhere classified, initial encounter: Secondary | ICD-10-CM | POA: Diagnosis not present

## 2018-06-17 ENCOUNTER — Other Ambulatory Visit: Payer: Self-pay

## 2018-06-17 MED ORDER — CITALOPRAM HYDROBROMIDE 20 MG PO TABS: 20.0000 mg | ORAL_TABLET | Freq: Every day | ORAL | 0 refills | Status: DC

## 2018-06-17 NOTE — Telephone Encounter (Signed)
Fax from pharmacy.  Refill request for Citalopram 20mg  tab.   Last seen 09/04/2017 No upcoming appointments

## 2018-06-17 NOTE — Telephone Encounter (Signed)
Refill request approved.  Will need appointment for next refill.

## 2018-07-16 ENCOUNTER — Other Ambulatory Visit: Payer: Self-pay | Admitting: Nurse Practitioner

## 2018-07-16 ENCOUNTER — Telehealth: Payer: Self-pay | Admitting: Unknown Physician Specialty

## 2018-07-16 MED ORDER — CITALOPRAM HYDROBROMIDE 20 MG PO TABS: 20.0000 mg | ORAL_TABLET | Freq: Every day | ORAL | 6 refills | Status: DC

## 2018-07-16 NOTE — Telephone Encounter (Signed)
Copied from [place] [phone]. Topic: Quick Communication - Rx Refill/Question >> Jul 16, 2018 12:06 PM Alfredia Ferguson R wrote: Medication:citalopram (CELEXA) 20 MG tablet  Has the patient contacted their pharmacy? Yes, appt made for 07/18/2018 patient ran out of meds today  Preferred Pharmacy (with phone number or street name): [facility], [place], [place] [phone] (Phone) [phone] (Fax)    Agent: Please be advised that RX refills may take up to 3 business days. We ask that you follow-up with your pharmacy.

## 2018-07-18 ENCOUNTER — Encounter: Payer: Self-pay | Admitting: Nurse Practitioner

## 2018-07-18 ENCOUNTER — Ambulatory Visit (INDEPENDENT_AMBULATORY_CARE_PROVIDER_SITE_OTHER): Payer: BLUE CROSS/BLUE SHIELD | Admitting: Nurse Practitioner

## 2018-07-18 VITALS — BP 120/87 | HR 96 | Temp 98.7°F | Ht 70.0 in | Wt 226.2 lb

## 2018-07-18 DIAGNOSIS — D509 Iron deficiency anemia, unspecified: Secondary | ICD-10-CM

## 2018-07-18 DIAGNOSIS — F324 Major depressive disorder, single episode, in partial remission: Secondary | ICD-10-CM

## 2018-07-18 DIAGNOSIS — R5383 Other fatigue: Secondary | ICD-10-CM | POA: Diagnosis not present

## 2018-07-18 MED ORDER — CITALOPRAM HYDROBROMIDE 20 MG PO TABS: 20.0000 mg | ORAL_TABLET | Freq: Every day | ORAL | 6 refills | Status: DC

## 2018-07-18 NOTE — Assessment & Plan Note (Signed)
Connected Care consult placed to determine assistance available to obtain colonoscopy.  CBC and anemia panel today.  Recommended taking Slow Fe 325 MG daily, along with orange juice or Ascorbic Acid 500 MG.  Return in 3 months.

## 2018-07-18 NOTE — Patient Instructions (Signed)
Anemia  Anemia is a condition in which you do not have enough red blood cells or hemoglobin. Hemoglobin is a substance in red blood cells that carries oxygen. When you do not have enough red blood cells or hemoglobin (are anemic), your body cannot get enough oxygen and your organs may not work properly. As a result, you may feel very tired or have other problems. What are the causes? Common causes of anemia include:  Excessive bleeding. Anemia can be caused by excessive bleeding inside or outside the body, including bleeding from the intestine or from periods in women.  Poor nutrition.  Long-lasting (chronic) kidney, thyroid, and liver disease.  Bone marrow disorders.  Cancer and treatments for cancer.  HIV (human immunodeficiency virus) and AIDS (acquired immunodeficiency syndrome).  Treatments for HIV and AIDS.  Spleen problems.  Blood disorders.  Infections, medicines, and autoimmune disorders that destroy red blood cells. What are the signs or symptoms? Symptoms of this condition include:  Minor weakness.  Dizziness.  Headache.  Feeling heartbeats that are irregular or faster than normal (palpitations).  Shortness of breath, especially with exercise.  Paleness.  Cold sensitivity.  Indigestion.  Nausea.  Difficulty sleeping.  Difficulty concentrating. Symptoms may occur suddenly or develop slowly. If your anemia is mild, you may not have symptoms. How is this diagnosed? This condition is diagnosed based on:  Blood tests.  Your medical history.  A physical exam.  Bone marrow biopsy. Your health care provider may also check your stool (feces) for blood and may do additional testing to look for the cause of your bleeding. You may also have other tests, including:  Imaging tests, such as a CT scan or MRI.  Endoscopy.  Colonoscopy. How is this treated? Treatment for this condition depends on the cause. If you continue to lose a lot of blood, you may  need to be treated at a hospital. Treatment may include:  Taking supplements of iron, vitamin S31, or folic acid.  Taking a hormone medicine (erythropoietin) that can help to stimulate red blood cell growth.  Having a blood transfusion. This may be needed if you lose a lot of blood.  Making changes to your diet.  Having surgery to remove your spleen. Follow these instructions at home:  Take over-the-counter and prescription medicines only as told by your health care provider.  Take supplements only as told by your health care provider.  Follow any diet instructions that you were given.  Keep all follow-up visits as told by your health care provider. This is important. Contact a health care provider if:  You develop new bleeding anywhere in the body. Get help right away if:  You are very weak.  You are short of breath.  You have pain in your abdomen or chest.  You are dizzy or feel faint.  You have trouble concentrating.  You have bloody or black, tarry stools.  You vomit repeatedly or you vomit up blood. Summary  Anemia is a condition in which you do not have enough red blood cells or enough of a substance in your red blood cells that carries oxygen (hemoglobin).  Symptoms may occur suddenly or develop slowly.  If your anemia is mild, you may not have symptoms.  This condition is diagnosed with blood tests as well as a medical history and physical exam. Other tests may be needed.  Treatment for this condition depends on the cause of the anemia. This information is not intended to replace advice given to you by  your health care provider. Make sure you discuss any questions you have with your health care provider. Document Released: 08/24/2004 Document Revised: 08/18/2016 Document Reviewed: 08/18/2016 Elsevier Interactive Patient Education  2019 [employer].

## 2018-07-18 NOTE — Assessment & Plan Note (Signed)
Chronic, stable.  Continue current dose of Celexa.   Refills provided.

## 2018-07-18 NOTE — Progress Notes (Signed)
BP 120/87   Pulse 96   Temp 98.7 F (37.1 C) (Oral)   Ht 5\' 10"  (1.778 m)   Wt 226 lb 3.2 oz (102.6 kg)   SpO2 98%   BMI 32.46 kg/m    Subjective:    Patient ID: David Wong, male    DOB: 12/29/1985, 32 y.o.   MRN: [id number]  HPI: David Wong is a 32 y.o. male presents for depression and anxiety f/u + anemia  Chief Complaint  Patient presents with  . Anxiety  . Depression   DEPRESSION/ANXIETY Reports stable mood on current regimen. Mood status: controlled Satisfied with current treatment?: yes Symptom severity: mild  Duration of current treatment : chronic Side effects: no Medication compliance: excellent compliance Psychotherapy/counseling: tried, did not like it Depressed mood: no Anxious mood: no Anhedonia: no Significant weight loss or gain: no Insomnia: yes hard to stay asleep Fatigue: no Feelings of worthlessness or guilt: no Impaired concentration/indecisiveness: no Suicidal ideations: no Hopelessness: no Crying spells: no Depression screen [facility] 2/9 07/18/2018 06/29/2017 07/12/2015 01/08/2015  Decreased Interest 0 1 0 1  Down, Depressed, Hopeless 1 2 1 2   PHQ - 2 Score 1 3 1 3   Altered sleeping 1 2 - 1  Tired, decreased energy 1 3 - 1  Change in appetite 0 1 - 0  Feeling bad or failure about yourself  0 1 - 1  Trouble concentrating 0 0 - 1  Moving slowly or fidgety/restless 0 0 - 0  Suicidal thoughts 0 0 - 0  PHQ-9 Score 3 10 - 7  Difficult doing work/chores Not difficult at all - - -   GAD 7 : Generalized Anxiety Score 07/18/2018  Nervous, Anxious, on Edge 1  Control/stop worrying 1  Worry too much - different things 1  Trouble relaxing 0  Restless 0  Easily annoyed or irritable 1  Afraid - awful might happen 0  Total GAD 7 Score 4  Anxiety Difficulty Not difficult at all    ANEMIA Could not afford colonoscopy.  Did follow-up with GI, but reports they "could not tell me much because I could not get colonoscopy".   States he is  only taking iron supplement every other day or less. Anemia status: stable Etiology of anemia: Duration of anemia treatment:  Compliance with treatment: fair compliance Iron supplementation side effects:none Severity of anemia: mild Fatigue: yes Decreased exercise tolerance: yes  Dyspnea on exertion: no Palpitations: no Bleeding: no Pica: yes  Relevant past medical, surgical, family and social history reviewed and updated as indicated. Interim medical history since our last visit reviewed. Allergies and medications reviewed and updated.  Review of Systems  Constitutional: Positive for fatigue. Negative for activity change, diaphoresis and fever.  Respiratory: Negative for cough, chest tightness, shortness of breath and wheezing.   Cardiovascular: Negative for chest pain, palpitations and leg swelling.  Gastrointestinal: Negative for abdominal distention, abdominal pain, constipation, diarrhea, nausea and vomiting.  Endocrine: Negative for cold intolerance, heat intolerance, polydipsia, polyphagia and polyuria.  Musculoskeletal: Negative.   Skin: Negative.   Neurological: Negative for dizziness, syncope, weakness, light-headedness, numbness and headaches.  Psychiatric/Behavioral: Negative.     Per HPI unless specifically indicated above     Objective:    BP 120/87   Pulse 96   Temp 98.7 F (37.1 C) (Oral)   Ht 5\' 10"  (1.778 m)   Wt 226 lb 3.2 oz (102.6 kg)   SpO2 98%   BMI 32.46 kg/m   Wt Readings  from Last 3 Encounters:  07/18/18 226 lb 3.2 oz (102.6 kg)  03/17/18 225 lb (102.1 kg)  09/10/17 233 lb (105.7 kg)    Physical Exam Vitals signs and nursing note reviewed.  Constitutional:      Appearance: He is well-developed.  HENT:     Head: Normocephalic and atraumatic.     Right Ear: Hearing normal. No drainage.     Left Ear: Hearing normal. No drainage.     Mouth/Throat:     Pharynx: Uvula midline.  Eyes:     General: Lids are normal.        Right eye: No  discharge.        Left eye: No discharge.     Conjunctiva/sclera: Conjunctivae normal.     Pupils: Pupils are equal, round, and reactive to light.  Neck:     Musculoskeletal: Normal range of motion and neck supple.     Thyroid: No thyromegaly.     Vascular: No carotid bruit or JVD.     Trachea: Trachea normal.  Cardiovascular:     Rate and Rhythm: Normal rate and regular rhythm.     Heart sounds: Normal heart sounds, S1 normal and S2 normal. No murmur. No gallop.   Pulmonary:     Effort: Pulmonary effort is normal.     Breath sounds: Normal breath sounds.  Abdominal:     General: Bowel sounds are normal.     Palpations: Abdomen is soft. There is no hepatomegaly or splenomegaly.  Musculoskeletal: Normal range of motion.  Skin:    General: Skin is warm and dry.     Capillary Refill: Capillary refill takes less than 2 seconds.     Findings: No rash.  Neurological:     Mental Status: He is alert and oriented to person, place, and time.     Deep Tendon Reflexes: Reflexes are normal and symmetric.  Psychiatric:        Behavior: Behavior normal.        Thought Content: Thought content normal.        Judgment: Judgment normal.     Results for orders placed or performed during the hospital encounter of 03/17/18  Rapid Strep Screen ([facility] ONLY)  Result Value Ref Range   Streptococcus, Group A Screen (Direct) NEGATIVE NEGATIVE  Culture, group A strep  Result Value Ref Range   Specimen Description      THROAT Performed at [facility], [address] [place], [place] [address]    Special Requests      NONE Reflexed from [phone] Performed at [facility], [address], [place], [place] [address]    Culture      NO GROUP A STREP (S.PYOGENES) ISOLATED Performed at [facility], [place] [address], [place], [place] [address]    Report Status 03/20/2018 FINAL       Assessment & Plan:   Problem List Items Addressed This Visit      Other     Depression, major, single episode, in partial remission ([place])    Chronic, stable.  Continue current dose of Celexa.   Refills provided.      Relevant Medications   citalopram (CELEXA) 20 MG tablet   Iron deficiency anemia - Primary    Connected Care consult placed to determine assistance available to obtain colonoscopy.  CBC and anemia panel today.  Recommended taking Slow Fe 325 MG daily, along with orange juice or Ascorbic Acid 500 MG.  Return in 3 months.      Relevant Orders   Ambulatory referral to Connected Care   CBC w/Diff   Anemia panel       Follow up plan: Return in about 3 months (around 10/17/2018) for anemia.

## 2018-07-19 ENCOUNTER — Telehealth: Payer: Self-pay | Admitting: Nurse Practitioner

## 2018-07-19 LAB — CBC WITH DIFFERENTIAL/PLATELET
Basophils Absolute: 0.1 10*3/uL (ref 0.0–0.2)
Basos: 1 %
EOS (ABSOLUTE): 0.4 10*3/uL (ref 0.0–0.4)
Eos: 5 %
Hemoglobin: 10.3 g/dL — ABNORMAL LOW (ref 13.0–17.7)
Immature Grans (Abs): 0 10*3/uL (ref 0.0–0.1)
Immature Granulocytes: 0 %
Lymphocytes Absolute: 2.2 10*3/uL (ref 0.7–3.1)
Lymphs: 34 %
MCH: 22 pg — ABNORMAL LOW (ref 26.6–33.0)
MCHC: 30.7 g/dL — ABNORMAL LOW (ref 31.5–35.7)
MCV: 72 fL — ABNORMAL LOW (ref 79–97)
Monocytes Absolute: 0.5 10*3/uL (ref 0.1–0.9)
Monocytes: 8 %
Neutrophils Absolute: 3.3 10*3/uL (ref 1.4–7.0)
Neutrophils: 52 %
Platelets: 259 10*3/uL (ref 150–450)
RBC: 4.68 x10E6/uL (ref 4.14–5.80)
RDW: 16.4 % — ABNORMAL HIGH (ref 12.3–15.4)
WBC: 6.4 10*3/uL (ref 3.4–10.8)

## 2018-07-19 LAB — ANEMIA PANEL
Ferritin: 7 ng/mL — ABNORMAL LOW (ref 30–400)
Folate, Hemolysate: 453 ng/mL
Folate, RBC: 1348 ng/mL (ref >498)
Hematocrit: 33.6 % — ABNORMAL LOW (ref 37.5–51.0)
Iron Saturation: 8 % — CL (ref 15–55)
Iron: 32 ug/dL — ABNORMAL LOW (ref 38–169)
Retic Ct Pct: 1.2 % (ref 0.6–2.6)
Total Iron Binding Capacity: 422 ug/dL (ref 250–450)
UIBC: 390 ug/dL — ABNORMAL HIGH (ref 111–343)
Vitamin B-12: 385 pg/mL (ref 232–1245)

## 2018-07-19 NOTE — Telephone Encounter (Signed)
Spoke to [employer] via telephone and review anemia labs with him.  HGB slightly improved from previous, although continues to be on low side as does iron and iron sat.  Recommended he take Slow Fe 325 MG daily along with Vitamin C 500 MG (or take it with orange juice) to enhance absorption.  He has not been taking iron regularly at home as instructed.  Will continue to work on assisting to obtain colonoscopy, connected care consult in place.  Will recheck labs at next visit.  He is to return to office or ER/urgent if any worsening symptoms or bleeding noted.

## 2018-09-23 ENCOUNTER — Encounter: Payer: Self-pay | Admitting: Nurse Practitioner

## 2018-09-23 ENCOUNTER — Ambulatory Visit: Payer: BLUE CROSS/BLUE SHIELD | Admitting: Nurse Practitioner

## 2018-09-23 ENCOUNTER — Other Ambulatory Visit: Payer: Self-pay

## 2018-09-23 VITALS — BP 138/90 | HR 93 | Temp 98.9°F | Ht 70.0 in | Wt 225.0 lb

## 2018-09-23 DIAGNOSIS — D509 Iron deficiency anemia, unspecified: Secondary | ICD-10-CM | POA: Diagnosis not present

## 2018-09-23 DIAGNOSIS — S61111A Laceration without foreign body of right thumb with damage to nail, initial encounter: Secondary | ICD-10-CM | POA: Insufficient documentation

## 2018-09-23 MED ORDER — PANTOPRAZOLE SODIUM 20 MG PO TBEC: 20.0000 mg | DELAYED_RELEASE_TABLET | Freq: Every day | ORAL | 3 refills | Status: DC

## 2018-09-23 MED ORDER — SUCRALFATE 1 G PO TABS: 1.0000 g | ORAL_TABLET | Freq: Three times a day (TID) | ORAL | 1 refills | Status: DC

## 2018-09-23 MED ORDER — MUPIROCIN 2 % EX OINT: 1.0000 "application " | TOPICAL_OINTMENT | Freq: Two times a day (BID) | CUTANEOUS | 0 refills | Status: DC

## 2018-09-23 NOTE — Assessment & Plan Note (Addendum)
Labs today. Will evaluate need for changes to plan of care based on results. Urgent referral to GI for 09/24/2018- pt aware and aware of need for colonoscopy. Script for Carafate and Protonix sent. Continue iron supplement. Collaborate with GI for further management. Follow-up in 2 weeks or sooner if need arises.

## 2018-09-23 NOTE — Progress Notes (Signed)
BP 138/90   Pulse 93   Temp 98.9 F (37.2 C) (Oral)   Ht 5\' 10"  (1.778 m)   Wt 225 lb (102.1 kg)   SpO2 98%   BMI 32.28 kg/m    Subjective:    Patient ID: David Wong, male    DOB: 05-Jul-1986, 33 y.o.   MRN: [id number]  HPI: David Wong is a 33 y.o. male   Chief Complaint  Patient presents with  . Anemia    f/u  . Hand Pain    right hand. thumb. pt state he got a cut at work today.   ANEMIA Pt reports BRBPR with "nearly every" bowel movement for the past "month or two". Pt reports passing golf ball sized dark red/black clots with bowel movements and "spraying" bright red blood from the rectum upon standing from a seated position on the toilet. Pt reports the toilet water turning bright with each stool occurrence. Pt unable to determine if stool is coated with blood or if blood is intermixed within the feces. Pt also reports "leaking" of blood from the rectum in between bowel movements "every once in a while". He reports an increase in frequency of bowel movements since the start of symptoms, with bowel movements typically occurring two or more times per day. He reports fluctuation between diarrhea and formed stools with the presence of blood with both. Pt reports nausea without vomiting over the same time period nearly every day.  He denies presence or history of hemorrhoids, GERD symptoms, changes in his diet, or recent weight loss. Pt denies homosexual activity or foreign object insertion into rectum.  He has not tried anything to alleviate the symptoms and denies recognition of anything making the symptoms worse. Pt reports he was scheduled for colonoscopy last year but was unable to complete the procedure due to financial concerns and inability to tolerate the bowel prep. Educated patient on the seriousness of symptoms and urgent need for GI evaluation. Pt verbalized understanding and agrees to attend appointment and have any testing needed to be performed. Of note on review  of chart he did report rectal bleeding at initial GI consult in 09/10/17.  [facility] consult was placed at last visit to determine if assistance available to help with cost of GI procedures, he is aware no assistance available at this time.  At previous visit H/H 10.3/33.6, MCV 72, B12 385, iron 32 --- slight trend upwards from February labs. Anemia status: uncontrolled Etiology of anemia: unknown- possible GI origin based on ongoing BRBPR Duration of anemia treatment: "on and off" for appx 1 year- consistently taking medication for appx 2 months.  At previous visit he was only been taking iron every other day or less. Compliance with treatment: good compliance  Iron supplementation side effects: no Severity of anemia: moderate Fatigue: yes Decreased exercise tolerance: yes  Dyspnea on exertion: yes Palpitations: no Bleeding: yes Pica: yes - ice   HAND PAIN: Injury present to right distal portion of thumb affecting nailbed. Pt reports getting his thumb caught in a tire jack at work today with incomplete tearing of the nail. He states he used superglue to reposition the nail in place to avoid further injury. He denies bleeding, pain, edema, or limited movement of joint. Pt educated on keeping the area clean, utilizing antibacterial ointment, and covering with a clean bandage to prevent further injury or infection. Recommended keeping nailbed covered while working.   Relevant past medical, surgical, family and social history reviewed and  updated as indicated. Interim medical history since our last visit reviewed. Allergies and medications reviewed and updated.  Review of Systems  Constitutional: Negative for activity change, diaphoresis, fatigue and fever.  Respiratory: Positive for shortness of breath (occasional with activity). Negative for cough, chest tightness and wheezing.   Cardiovascular: Negative for chest pain, palpitations and leg swelling.  Gastrointestinal: Positive for blood in stool  and nausea. Negative for abdominal distention, abdominal pain, constipation, diarrhea, rectal pain and vomiting.  Endocrine: Negative for cold intolerance, heat intolerance, polydipsia, polyphagia and polyuria.  Musculoskeletal: Negative.   Skin: Positive for wound (right thumb).  Neurological: Negative for dizziness, syncope, weakness, light-headedness, numbness and headaches.  Hematological: Does not bruise/bleed easily.  Psychiatric/Behavioral: Negative.     Per HPI unless specifically indicated above     Objective:    BP 138/90   Pulse 93   Temp 98.9 F (37.2 C) (Oral)   Ht 5\' 10"  (1.778 m)   Wt 225 lb (102.1 kg)   SpO2 98%   BMI 32.28 kg/m   Wt Readings from Last 3 Encounters:  09/23/18 225 lb (102.1 kg)  07/18/18 226 lb 3.2 oz (102.6 kg)  03/17/18 225 lb (102.1 kg)    Physical Exam Vitals signs and nursing note reviewed. Exam conducted with a chaperone present.  Constitutional:      Appearance: He is well-developed.  HENT:     Head: Normocephalic and atraumatic.     Right Ear: Hearing normal. No drainage.     Left Ear: Hearing normal. No drainage.     Mouth/Throat:     Pharynx: Uvula midline.  Eyes:     General: Lids are normal.        Right eye: No discharge.        Left eye: No discharge.     Conjunctiva/sclera: Conjunctivae normal.     Pupils: Pupils are equal, round, and reactive to light.  Neck:     Musculoskeletal: Normal range of motion and neck supple.     Thyroid: No thyromegaly.     Vascular: No carotid bruit or JVD.     Trachea: Trachea normal.  Cardiovascular:     Rate and Rhythm: Normal rate and regular rhythm.     Heart sounds: Normal heart sounds, S1 normal and S2 normal. No murmur. No gallop.   Pulmonary:     Effort: Pulmonary effort is normal.     Breath sounds: Normal breath sounds.  Abdominal:     General: Bowel sounds are normal. There is no distension.     Palpations: Abdomen is soft. There is no hepatomegaly or splenomegaly.      Tenderness: There is no abdominal tenderness.  Genitourinary:    Rectum: Normal.     Comments: No external hemorrhoids or fissures detected on visual examination. No internal hemorrhoids, masses,  or tenderness noted on internal examination. Normal anal tone detected. No blood noted on gloved finger following rectal examination.  Musculoskeletal: Normal range of motion.       Hands:     Right lower leg: No edema.     Left lower leg: No edema.  Skin:    General: Skin is warm and dry.     Capillary Refill: Capillary refill takes less than 2 seconds.     Findings: No rash.  Neurological:     Mental Status: He is alert and oriented to person, place, and time.     Deep Tendon Reflexes: Reflexes are normal and symmetric.  Psychiatric:  Mood and Affect: Mood normal.        Behavior: Behavior normal.        Thought Content: Thought content normal.        Judgment: Judgment normal.     Results for orders placed or performed in visit on 07/18/18  CBC w/Diff  Result Value Ref Range   WBC 6.4 3.4 - 10.8 x10E3/uL   RBC 4.68 4.14 - 5.80 x10E6/uL   Hemoglobin 10.3 (L) 13.0 - 17.7 g/dL   MCV 72 (L) 79 - 97 fL   MCH 22.0 (L) 26.6 - 33.0 pg   MCHC 30.7 (L) 31.5 - 35.7 g/dL   RDW 16.4 (H) 12.3 - 15.4 %   Platelets 259 150 - 450 x10E3/uL   Neutrophils 52 Not Estab. %   Lymphs 34 Not Estab. %   Monocytes 8 Not Estab. %   Eos 5 Not Estab. %   Basos 1 Not Estab. %   Neutrophils Absolute 3.3 1.4 - 7.0 x10E3/uL   Lymphocytes Absolute 2.2 0.7 - 3.1 x10E3/uL   Monocytes Absolute 0.5 0.1 - 0.9 x10E3/uL   EOS (ABSOLUTE) 0.4 0.0 - 0.4 x10E3/uL   Basophils Absolute 0.1 0.0 - 0.2 x10E3/uL   Immature Granulocytes 0 Not Estab. %   Immature Grans (Abs) 0.0 0.0 - 0.1 x10E3/uL  Anemia panel  Result Value Ref Range   Total Iron Binding Capacity 422 250 - 450 ug/dL   UIBC 390 (H) 111 - 343 ug/dL   Iron 32 (L) 38 - 169 ug/dL   Iron Saturation 8 (LL) 15 - 55 %   Vitamin B-12 385 232 - 1,245 pg/mL     Folate, Hemolysate 453.0 Not Estab. ng/mL   Hematocrit 33.6 (L) 37.5 - 51.0 %   Folate, RBC 1,348 >498 ng/mL   Ferritin 7 (L) 30 - 400 ng/mL   Retic Ct Pct 1.2 0.6 - 2.6 %      Assessment & Plan:   Problem List Items Addressed This Visit      Musculoskeletal and Integument   Laceration of right thumb without foreign body with damage to nail    Script for bactroban sent. Pt to report redness, warmth, infection. Follow-up as needed.         Other   Iron deficiency anemia - Primary    Labs today. Will evaluate need for changes to plan of care based on results. Urgent referral to GI for 09/24/2018- pt aware. Script for Carafate and Protonix sent. Continue iron supplement. Collaborate with GI for further management. Follow-up in 2 weeks or sooner if need arises.        Relevant Medications   Ferrous Sulfate (IRON PO)   Other Relevant Orders   CBC with Differential/Platelet   Anemia panel   Ambulatory referral to Gastroenterology       Follow up plan: Return in about 2 weeks (around 10/07/2018) for Anemia.

## 2018-09-23 NOTE — Patient Instructions (Signed)
Rectal Bleeding    Rectal bleeding is when blood comes out of the opening of the butt (anus). People with this kind of bleeding may notice bright red blood in their underwear or in the toilet after they poop (have a bowel movement). They may also have dark red or black poop (stool). Rectal bleeding is often a sign that something is wrong. It needs to be checked by a doctor.  Follow these instructions at home:  Watch for any changes in your condition. Take these actions to help with bleeding and discomfort:  Eat a diet that is high in fiber. This will keep your poop soft so it is easier for you to poop without pushing too hard. Ask your doctor to tell you what foods and drinks are high in fiber.  Drink enough fluid to keep your pee (urine) clear or pale yellow. This also helps keep your poop soft.  Try taking a warm bath. This may help with pain.  Keep all follow-up visits as told by your doctor. This is important.  Get help right away if:  You have new bleeding.  You have more bleeding than before.  You have black or dark red poop.  You throw up (vomit) blood or something that looks like coffee grounds.  You have pain or tenderness in your belly (abdomen).  You have a fever.  You feel weak.  You feel sick to your stomach (nauseous).  You pass out (faint).  You have very bad pain in your butt.  You cannot poop.  This information is not intended to replace advice given to you by your health care provider. Make sure you discuss any questions you have with your health care provider.  Document Released: 03/29/2011 Document Revised: 12/23/2015 Document Reviewed: 09/12/2015  Elsevier Interactive Patient Education  2019 [employer]

## 2018-09-23 NOTE — Assessment & Plan Note (Addendum)
Script for [employer] sent. Pt to report redness/warmth/signs of infection. Follow-up as needed.

## 2018-09-24 ENCOUNTER — Other Ambulatory Visit: Payer: Self-pay

## 2018-09-24 ENCOUNTER — Ambulatory Visit (INDEPENDENT_AMBULATORY_CARE_PROVIDER_SITE_OTHER): Payer: BLUE CROSS/BLUE SHIELD | Admitting: Gastroenterology

## 2018-09-24 ENCOUNTER — Telehealth: Payer: Self-pay | Admitting: Nurse Practitioner

## 2018-09-24 ENCOUNTER — Encounter: Payer: Self-pay | Admitting: Gastroenterology

## 2018-09-24 VITALS — BP 120/89 | HR 90 | Resp 17 | Ht 70.0 in | Wt 225.0 lb

## 2018-09-24 DIAGNOSIS — D500 Iron deficiency anemia secondary to blood loss (chronic): Secondary | ICD-10-CM

## 2018-09-24 DIAGNOSIS — K625 Hemorrhage of anus and rectum: Secondary | ICD-10-CM

## 2018-09-24 LAB — ANEMIA PANEL
Ferritin: 20 ng/mL — ABNORMAL LOW (ref 30–400)
Folate, Hemolysate: 448 ng/mL
Folate, RBC: 1123 ng/mL (ref >498)
Hematocrit: 39.9 % (ref 37.5–51.0)
Iron Saturation: 5 % — CL (ref 15–55)
Iron: 20 ug/dL — ABNORMAL LOW (ref 38–169)
Retic Ct Pct: 1.9 % (ref 0.6–2.6)
Total Iron Binding Capacity: 432 ug/dL (ref 250–450)
UIBC: 412 ug/dL — ABNORMAL HIGH (ref 111–343)
Vitamin B-12: 491 pg/mL (ref 232–1245)

## 2018-09-24 LAB — CBC WITH DIFFERENTIAL/PLATELET
Basophils Absolute: 0.1 10*3/uL (ref 0.0–0.2)
Basos: 1 %
EOS (ABSOLUTE): 0.4 10*3/uL (ref 0.0–0.4)
Eos: 4 %
Hemoglobin: 12.1 g/dL — ABNORMAL LOW (ref 13.0–17.7)
Immature Grans (Abs): 0.1 10*3/uL (ref 0.0–0.1)
Immature Granulocytes: 1 %
Lymphocytes Absolute: 3.3 10*3/uL — ABNORMAL HIGH (ref 0.7–3.1)
Lymphs: 34 %
MCH: 22.4 pg — ABNORMAL LOW (ref 26.6–33.0)
MCHC: 30.3 g/dL — ABNORMAL LOW (ref 31.5–35.7)
MCV: 74 fL — ABNORMAL LOW (ref 79–97)
Monocytes Absolute: 0.6 10*3/uL (ref 0.1–0.9)
Monocytes: 7 %
Neutrophils Absolute: 5.1 10*3/uL (ref 1.4–7.0)
Neutrophils: 53 %
Platelets: 260 10*3/uL (ref 150–450)
RBC: 5.4 x10E6/uL (ref 4.14–5.80)
RDW: 20.6 % — ABNORMAL HIGH (ref 11.6–15.4)
WBC: 9.5 10*3/uL (ref 3.4–10.8)

## 2018-09-24 MED ORDER — ONDANSETRON HCL 4 MG PO TABS: 4.0000 mg | ORAL_TABLET | Freq: Every day | ORAL | 0 refills | Status: DC | PRN

## 2018-09-24 NOTE — Telephone Encounter (Signed)
Reviewed patient's lab via telephone with him and discussed that iron level continues to be on low side although H/H show some improvement, although continue on anemic side.  Strongly recommended he make it to GI appointment this afternoon and obtain further testing and procedures.  He reports "I will, I am going to be there".

## 2018-09-24 NOTE — Progress Notes (Signed)
David Darby, MD [address]  [place]  [place], [place] [address]  Main: [phone]  Fax: [phone] Pager: [phone]   Gastroenterology Consultation  Referring Provider:     Venita Lick, NP Primary Care Physician:  Venita Lick, NP Primary Gastroenterologist:  Dr. Jonathon Bellows  Reason for Consultation:     Iron deficiency anemia, rectal bleeding        HPI:   David Wong is a 33 y.o. y/o male referred for consultation & management  by Dr. Venita Lick, NP.    He has been referred for iron deficiency anemia. Hb was normal 2 years back. Sudden drop noted when checked 8 days back with Hb 9.5 grams with MCV 69. Labs were done as routine. Denies any NSAID use   Iron/TIBC/Ferritin/ %Sat    Component Value Date/Time   IRON 19 (L) 09/04/2017 1541   TIBC 405 09/04/2017 1541   FERRITIN 7 (L) 09/04/2017 1541   IRONPCTSAT 5 (LL) 09/04/2017 1541   urine analysis negative.   Rectal bleeding: yes- since a few months , in the toilet bowl , bright red, no diarrhea , no hard stools, no weight loss  Nose bleeds: no  Hematemesis or hemoptysis : no  Blood in urine : no   Follow-up visit 09/24/2018 Patient did not undergo EGD or colonoscopy as recommended by Dr. Vicente Males in 08/2017.  He said he was throwing up the bowel prep every time he was taking GoLYTELY.  He did not follow-up with him later.  Patient was seen as an urgent visit requested by his PCP today.  He has ongoing painless bright red bleeding per rectum, every time he has a bowel movement, dripping in the toilet bowl as well as spraying of the blood after the BM.  His anemia has been nicely improving to oral iron supplementation.  His iron levels are improving as well.  He has normal B12 and folate levels.  He continues to feel tired.  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  He denies any other hemorrhoidal symptoms.  He denies family  history of GI malignancy.  Past Medical History:  Diagnosis Date  . Anemia   . Anxiety   . Depression     Past Surgical History:  Procedure Laterality Date  . right pinky finger  2013   pins inserted    Current Outpatient Medications:  .  citalopram (CELEXA) 20 MG tablet, Take 1 tablet (20 mg total) by mouth daily., Disp: 30 tablet, Rfl: 6 .  Ferrous Sulfate (IRON PO), Take by mouth daily., Disp: , Rfl:  .  mupirocin ointment (BACTROBAN) 2 %, Place 1 application into the nose 2 (two) times daily., Disp: 22 g, Rfl: 0 .  ondansetron (ZOFRAN) 4 MG tablet, Take 1 tablet (4 mg total) by mouth daily as needed for up to 7 doses for nausea or vomiting., Disp: 7 tablet, Rfl: 0 .  pantoprazole (PROTONIX) 20 MG tablet, Take 1 tablet (20 mg total) by mouth daily., Disp: 30 tablet, Rfl: 3 .  sucralfate (CARAFATE) 1 g tablet, Take 1 tablet (1 g total) by mouth 4 (four) times daily -  with meals and at bedtime., Disp: 90 tablet, Rfl: 1   Family History  Problem Relation Age of Onset  . Hyperlipidemia Mother   . Hypertension Mother   . Hyperlipidemia Father   . Diabetes Father   . Hypertension Brother   .  Arthritis Maternal Grandfather        RA  . Heart disease Paternal Grandfather      Social History   Tobacco Use  . Smoking status: Never Smoker  . Smokeless tobacco: Never Used  Substance Use Topics  . Alcohol use: Yes    Alcohol/week: 0.0 standard drinks    Comment: Rarely  . Drug use: No    Allergies as of 09/24/2018  . (No Known Allergies)    Review of Systems:    All systems reviewed and negative except where noted in HPI.   Physical Exam:  BP 120/89 (BP Location: Left Arm, Patient Position: Sitting, Cuff Size: Large)   Pulse 90   Resp 17   Ht 5\' 10"  (1.778 m)   Wt 225 lb (102.1 kg)   BMI 32.28 kg/m  No LMP for male patient. Psych:  Alert and cooperative. Normal mood and affect. General:   Alert,  Well-developed, well-nourished, pleasant and cooperative in  NAD Head:  Normocephalic and atraumatic. Eyes:  Sclera clear, no icterus.   Conjunctiva pink. Ears:  Normal auditory acuity. Nose:  No deformity, discharge, or lesions. Mouth:  No deformity or lesions,oropharynx pink & moist. Neck:  Supple; no masses or thyromegaly. Lungs:  Respirations even and unlabored.  Clear throughout to auscultation.   No wheezes, crackles, or rhonchi. No acute distress. Heart:  Regular rate and rhythm; no murmurs, clicks, rubs, or gallops. Abdomen:  Normal bowel sounds.  No bruits.  Soft, non-tender and non-distended without masses, hepatosplenomegaly or hernias noted.  No guarding or rebound tenderness.    Msk:  Symmetrical without gross deformities. Good, equal movement & strength bilaterally. Skin:  Intact without significant lesions or rashes. No jaundice. Psych:  Alert and cooperative. Normal mood and affect.  Imaging Studies: No results found.  Assessment and Plan:   [name] is a 33 y.o. male with no significant past medical history, presents for follow-up of severe iron deficiency anemia in setting of chronic painless bright red bleeding per rectum.  His history is highly suggestive of bleeding from internal hemorrhoids.  Given that he has severe iron deficiency, recommend to rule out other pathology.    Plan  1. EGD+colonosocpy, I have prescribed him Zofran to take prior to drinking bowel prep 2. Discussed with him about outpatient hemorrhoid ligation after colonoscopy   Follow up in 2-3 weeks

## 2018-10-07 ENCOUNTER — Encounter: Payer: Self-pay | Admitting: *Deleted

## 2018-10-08 ENCOUNTER — Ambulatory Visit: Payer: BLUE CROSS/BLUE SHIELD | Admitting: Anesthesiology

## 2018-10-08 ENCOUNTER — Encounter: Payer: Self-pay | Admitting: Anesthesiology

## 2018-10-08 ENCOUNTER — Encounter
Admission: RE | Disposition: A | Discharge status: Home / Self Care | Payer: Self-pay | Source: Home / Self Care | Attending: Gastroenterology

## 2018-10-08 ENCOUNTER — Ambulatory Visit
Admission: RE | Admit: 2018-10-08 | Discharge: 2018-10-08 | Disposition: A | Discharge status: Home / Self Care | Payer: BLUE CROSS/BLUE SHIELD | Attending: Gastroenterology | Admitting: Gastroenterology

## 2018-10-08 ENCOUNTER — Ambulatory Visit: Payer: BLUE CROSS/BLUE SHIELD | Admitting: Nurse Practitioner

## 2018-10-08 ENCOUNTER — Other Ambulatory Visit: Payer: Self-pay

## 2018-10-08 DIAGNOSIS — F419 Anxiety disorder, unspecified: Secondary | ICD-10-CM | POA: Insufficient documentation

## 2018-10-08 DIAGNOSIS — D649 Anemia, unspecified: Secondary | ICD-10-CM | POA: Insufficient documentation

## 2018-10-08 DIAGNOSIS — D500 Iron deficiency anemia secondary to blood loss (chronic): Secondary | ICD-10-CM | POA: Insufficient documentation

## 2018-10-08 DIAGNOSIS — F329 Major depressive disorder, single episode, unspecified: Secondary | ICD-10-CM | POA: Insufficient documentation

## 2018-10-08 DIAGNOSIS — Z833 Family history of diabetes mellitus: Secondary | ICD-10-CM | POA: Insufficient documentation

## 2018-10-08 DIAGNOSIS — K2970 Gastritis, unspecified, without bleeding: Secondary | ICD-10-CM | POA: Insufficient documentation

## 2018-10-08 DIAGNOSIS — K648 Other hemorrhoids: Secondary | ICD-10-CM | POA: Diagnosis not present

## 2018-10-08 DIAGNOSIS — Z8249 Family history of ischemic heart disease and other diseases of the circulatory system: Secondary | ICD-10-CM | POA: Insufficient documentation

## 2018-10-08 DIAGNOSIS — Z79899 Other long term (current) drug therapy: Secondary | ICD-10-CM | POA: Diagnosis not present

## 2018-10-08 DIAGNOSIS — G4730 Sleep apnea, unspecified: Secondary | ICD-10-CM | POA: Insufficient documentation

## 2018-10-08 DIAGNOSIS — K210 Gastro-esophageal reflux disease with esophagitis: Secondary | ICD-10-CM | POA: Diagnosis not present

## 2018-10-08 DIAGNOSIS — Z8261 Family history of arthritis: Secondary | ICD-10-CM | POA: Insufficient documentation

## 2018-10-08 DIAGNOSIS — K2960 Other gastritis without bleeding: Secondary | ICD-10-CM | POA: Diagnosis not present

## 2018-10-08 DIAGNOSIS — K625 Hemorrhage of anus and rectum: Secondary | ICD-10-CM | POA: Insufficient documentation

## 2018-10-08 DIAGNOSIS — K2210 Ulcer of esophagus without bleeding: Secondary | ICD-10-CM | POA: Diagnosis not present

## 2018-10-08 DIAGNOSIS — K649 Unspecified hemorrhoids: Secondary | ICD-10-CM

## 2018-10-08 HISTORY — PX: ESOPHAGOGASTRODUODENOSCOPY (EGD) WITH PROPOFOL: SHX5813

## 2018-10-08 HISTORY — PX: COLONOSCOPY WITH PROPOFOL: SHX5780

## 2018-10-08 SURGERY — ESOPHAGOGASTRODUODENOSCOPY (EGD) WITH PROPOFOL
Anesthesia: General

## 2018-10-08 MED ORDER — PROPOFOL 10 MG/ML IV BOLUS
INTRAVENOUS | Status: AC
  Filled 2018-10-08: qty 40

## 2018-10-08 MED ORDER — FENTANYL CITRATE (PF) 100 MCG/2ML IJ SOLN
INTRAMUSCULAR | Status: DC | PRN
  Administered 2018-10-08: 25 ug via INTRAVENOUS
  Administered 2018-10-08: 50 ug via INTRAVENOUS
  Administered 2018-10-08: 25 ug via INTRAVENOUS

## 2018-10-08 MED ORDER — PROPOFOL 500 MG/50ML IV EMUL
INTRAVENOUS | Status: DC | PRN
  Administered 2018-10-08: 100 ug/kg/min via INTRAVENOUS

## 2018-10-08 MED ORDER — FENTANYL CITRATE (PF) 100 MCG/2ML IJ SOLN
INTRAMUSCULAR | Status: AC
  Filled 2018-10-08: qty 2

## 2018-10-08 MED ORDER — LIDOCAINE HCL (CARDIAC) PF 100 MG/5ML IV SOSY
PREFILLED_SYRINGE | INTRAVENOUS | Status: DC | PRN
  Administered 2018-10-08: 100 mg via INTRAVENOUS

## 2018-10-08 MED ORDER — PROPOFOL 10 MG/ML IV BOLUS
INTRAVENOUS | Status: DC | PRN
  Administered 2018-10-08 (×2): 50 mg via INTRAVENOUS

## 2018-10-08 MED ORDER — OMEPRAZOLE 40 MG PO CPDR: 40.0000 mg | DELAYED_RELEASE_CAPSULE | Freq: Two times a day (BID) | ORAL | 2 refills | Status: DC

## 2018-10-08 MED ORDER — PHENYLEPHRINE HCL 10 MG/ML IJ SOLN
INTRAMUSCULAR | Status: DC | PRN
  Administered 2018-10-08 (×2): 100 ug via INTRAVENOUS

## 2018-10-08 MED ORDER — SODIUM CHLORIDE 0.9 % IV SOLN
INTRAVENOUS | Status: DC
  Administered 2018-10-08: 13:00:00 via INTRAVENOUS

## 2018-10-08 NOTE — Anesthesia Preprocedure Evaluation (Signed)
Anesthesia Evaluation  Patient identified by MRN, date of birth, ID band Patient awake    Reviewed: Allergy & Precautions, NPO status , Patient's Chart, lab work & pertinent test results, reviewed documented beta blocker date and time   Airway Mallampati: III  TM Distance: >3 FB     Dental  (+) Chipped   Pulmonary sleep apnea ,           Cardiovascular      Neuro/Psych PSYCHIATRIC DISORDERS Anxiety Depression    GI/Hepatic   Endo/Other    Renal/GU      Musculoskeletal   Abdominal   Peds  Hematology  (+) anemia ,   Anesthesia Other Findings Obese.  Reproductive/Obstetrics                             Anesthesia Physical Anesthesia Plan  ASA: III  Anesthesia Plan: General   Post-op Pain Management:    Induction: Intravenous  PONV Risk Score and Plan:   Airway Management Planned:   Additional Equipment:   Intra-op Plan:   Post-operative Plan:   Informed Consent: I have reviewed the patients History and Physical, chart, labs and discussed the procedure including the risks, benefits and alternatives for the proposed anesthesia with the patient or authorized representative who has indicated his/her understanding and acceptance.       Plan Discussed with: CRNA  Anesthesia Plan Comments:         Anesthesia Quick Evaluation

## 2018-10-08 NOTE — Transfer of Care (Signed)
Immediate Anesthesia Transfer of Care Note  Patient: David Wong  Procedure(s) Performed: ESOPHAGOGASTRODUODENOSCOPY (EGD) WITH PROPOFOL (N/A ) COLONOSCOPY WITH PROPOFOL (N/A )  Patient Location: PACU and Endoscopy Unit  Anesthesia Type:General  Level of Consciousness: drowsy and patient cooperative  Airway & Oxygen Therapy: Patient Spontanous Breathing  Post-op Assessment: Report given to RN, Post -op Vital signs reviewed and stable and Patient moving all extremities  Post vital signs: Reviewed and stable  Last Vitals:  Vitals Value Taken Time  BP 93/51 10/08/2018  3:03 PM  Temp    Pulse 76 10/08/2018  3:03 PM  Resp 17 10/08/2018  3:03 PM  SpO2 96 % 10/08/2018  3:03 PM  Vitals shown include unvalidated device data.  Last Pain:  Vitals:   10/08/18 1503  TempSrc: (P) Tympanic  PainSc:          Complications: No apparent anesthesia complications

## 2018-10-08 NOTE — Op Note (Signed)
[facility] Gastroenterology Patient Name: David Wong Procedure Date: 10/08/2018 2:21 PM MRN: [id number] Account #: [id number] Date of Birth: [birth year] Admit Type: Outpatient Age: 33 Room: [facility] ENDO ROOM 4 Gender: Male Note Status: Finalized Procedure:            Colonoscopy Indications:          Rectal bleeding, Iron deficiency anemia secondary to                        chronic blood loss Providers:            Lin Landsman MD, MD Medicines:            Monitored Anesthesia Care Complications:        No immediate complications. Estimated blood loss: None. Procedure:            Pre-Anesthesia Assessment:                       - Prior to the procedure, a History and Physical was                        performed, and patient medications and allergies were                        reviewed. The patient is competent. The risks and                        benefits of the procedure and the sedation options and                        risks were discussed with the patient. All questions                        were answered and informed consent was obtained.                        Patient identification and proposed procedure were                        verified by the physician, the nurse, the                        anesthesiologist, the anesthetist and the technician in                        the pre-procedure area in the procedure room in the                        endoscopy suite. Mental Status Examination: alert and                        oriented. Airway Examination: normal oropharyngeal                        airway and neck mobility. Respiratory Examination:                        clear to auscultation. CV Examination: normal.  Prophylactic Antibiotics: The patient does not require                        prophylactic antibiotics. Prior Anticoagulants: The                        patient has taken no previous anticoagulant or           antiplatelet agents. ASA Grade Assessment: III - A                        patient with severe systemic disease. After reviewing                        the risks and benefits, the patient was deemed in                        satisfactory condition to undergo the procedure. The                        anesthesia plan was to use monitored anesthesia care                        (MAC). Immediately prior to administration of                        medications, the patient was re-assessed for adequacy                        to receive sedatives. The heart rate, respiratory rate,                        oxygen saturations, blood pressure, adequacy of                        pulmonary ventilation, and response to care were                        monitored throughout the procedure. The physical status                        of the patient was re-assessed after the procedure.                       After obtaining informed consent, the colonoscope was                        passed under direct vision. Throughout the procedure,                        the patient's blood pressure, pulse, and oxygen                        saturations were monitored continuously. The                        Colonoscope was introduced through the anus and                        advanced to the the terminal ileum, with identification  of the appendiceal orifice and IC valve. The                        colonoscopy was performed without difficulty. The                        patient tolerated the procedure well. The quality of                        the bowel preparation was adequate. Findings:      The perianal and digital rectal examinations were normal. Pertinent       negatives include normal sphincter tone and no palpable rectal lesions.      The terminal ileum appeared normal.      Non-bleeding internal hemorrhoids were found during retroflexion. The       hemorrhoids were large, source of  rectal bleeding and IDA.      The exam was otherwise without abnormality. Impression:           - The examined portion of the ileum was normal.                       - Non-bleeding internal hemorrhoids, source of recrtal                        bleeding and IDA.                       - The examination was otherwise normal.                       - No specimens collected. Recommendation:       - Discharge patient to home (with escort).                       - Resume previous diet today.                       - Continue present medications.                       - Return to my office as previously scheduled for                        hemorrhoid ligation Procedure Code(s):    --- Professional ---                       [id number], Colonoscopy, flexible; diagnostic, including                        collection of specimen(s) by brushing or washing, when                        performed (separate procedure) Diagnosis Code(s):    --- Professional ---                       W11.9, Other hemorrhoids                       K62.5, Hemorrhage of anus and rectum  D50.0, Iron deficiency anemia secondary to blood loss                        (chronic) CPT copyright 2018 American Medical Association. All rights reserved. The codes documented in this report are preliminary and upon coder review may  be revised to meet current compliance requirements. Dr. Ulyess Mort Lin Landsman MD, MD 10/08/2018 3:01:56 PM This report has been signed electronically. Number of Addenda: 0 Note Initiated On: 10/08/2018 2:21 PM Scope Withdrawal Time: 0 hours 9 minutes 28 seconds  Total Procedure Duration: 0 hours 12 minutes 6 seconds       [facility]

## 2018-10-08 NOTE — H&P (Signed)
[name], MD [address]  [place]  [place], [place] [address]  Main: [phone]  Fax: [phone] Pager: [phone]  Primary Care Physician:  Venita Lick, NP Primary Gastroenterologist:  Dr. [name]  Pre-Procedure History & Physical: HPI:  David Wong is a 33 y.o. male is here for an endoscopy and colonoscopy.   Past Medical History:  Diagnosis Date  . Anemia   . Anxiety   . Depression     Past Surgical History:  Procedure Laterality Date  . right pinky finger  2013   pins inserted    Prior to Admission medications   Medication Sig Start Date End Date Taking? Authorizing Provider  citalopram (CELEXA) 20 MG tablet Take 1 tablet (20 mg total) by mouth daily. 07/18/18  Yes Cannady, Henrine Screws T, NP  Ferrous Sulfate (IRON PO) Take by mouth daily.   Yes [provider]  mupirocin ointment (BACTROBAN) 2 % Place 1 application into the nose 2 (two) times daily. Patient not taking: Reported on 10/08/2018 09/23/18   Marnee Guarneri T, NP  ondansetron (ZOFRAN) 4 MG tablet Take 1 tablet (4 mg total) by mouth daily as needed for up to 7 doses for nausea or vomiting. Patient not taking: Reported on 10/08/2018 09/24/18   Lin Landsman, MD  pantoprazole (PROTONIX) 20 MG tablet Take 1 tablet (20 mg total) by mouth daily. Patient not taking: Reported on 10/08/2018 09/23/18   Marnee Guarneri T, NP  sucralfate (CARAFATE) 1 g tablet Take 1 tablet (1 g total) by mouth 4 (four) times daily -  with meals and at bedtime. Patient not taking: Reported on 10/08/2018 09/23/18   Marnee Guarneri T, NP    Allergies as of 09/24/2018  . (No Known Allergies)    Family History  Problem Relation Age of Onset  . Hyperlipidemia Mother   . Hypertension Mother   . Hyperlipidemia Father   . Diabetes Father   . Hypertension Brother   . Arthritis Maternal Grandfather        RA  . Heart disease Paternal Grandfather     Social History   Socioeconomic History  .  Marital status: Single    Spouse name: Not on file  . Number of children: Not on file  . Years of education: Not on file  . Highest education level: Not on file  Occupational History  . Not on file  Social Needs  . Financial resource strain: Not on file  . Food insecurity:    Worry: Not on file    Inability: Not on file  . Transportation needs:    Medical: Not on file    Non-medical: Not on file  Tobacco Use  . Smoking status: Never Smoker  . Smokeless tobacco: Never Used  Substance and Sexual Activity  . Alcohol use: Yes    Alcohol/week: 0.0 standard drinks    Comment: Rarely  . Drug use: No  . Sexual activity: Yes    Partners: Female    Comment: Fiance on birth control  Lifestyle  . Physical activity:    Days per week: Not on file    Minutes per session: Not on file  . Stress: Not on file  Relationships  . Social connections:    Talks on phone: Not on file    Gets together: Not on file    Attends religious service: Not on file    Active member of club or organization: Not on file    Attends meetings  of clubs or organizations: Not on file    Relationship status: Not on file  . Intimate partner violence:    Fear of current or ex partner: Not on file    Emotionally abused: Not on file    Physically abused: Not on file    Forced sexual activity: Not on file  Other Topics Concern  . Not on file  Social History Narrative  . Not on file    Review of Systems: See HPI, otherwise negative ROS  Physical Exam: BP (!) 130/92   Pulse 93   Temp (!) 97.5 F (36.4 C) (Tympanic)   Resp 18   SpO2 100%  General:   Alert,  pleasant and cooperative in NAD Head:  Normocephalic and atraumatic. Neck:  Supple; no masses or thyromegaly. Lungs:  Clear throughout to auscultation.    Heart:  Regular rate and rhythm. Abdomen:  Soft, nontender and nondistended. Normal bowel sounds, without guarding, and without rebound.   Neurologic:  Alert and  oriented x4;  grossly normal  neurologically.  Impression/Plan: David Wong is here for an endoscopy and colonoscopy to be performed for IDA, rectal bleeding  Risks, benefits, limitations, and alternatives regarding  endoscopy and colonoscopy have been reviewed with the patient.  Questions have been answered.  All parties agreeable.   Sherri Sear, MD  10/08/2018, 1:46 PM

## 2018-10-08 NOTE — Addendum Note (Signed)
Addendum  created 10/08/18 1859 by Gunnar Bulla, MD   Attestation recorded in [place], [place] filed

## 2018-10-08 NOTE — Anesthesia Post-op Follow-up Note (Signed)
Anesthesia QCDR form completed.        

## 2018-10-08 NOTE — Op Note (Signed)
[facility] Gastroenterology Patient Name: David Wong Procedure Date: 10/08/2018 2:22 PM MRN: [id number] Account #: [id number] Date of Birth: August 30, 1985 Admit Type: Outpatient Age: 33 Room: [facility] ENDO ROOM 4 Gender: Male Note Status: Finalized Procedure:            Upper GI endoscopy Indications:          Iron deficiency anemia secondary to chronic blood loss Providers:            Lin Landsman MD, MD Medicines:            Monitored Anesthesia Care Complications:        No immediate complications. Estimated blood loss: None. Procedure:            Pre-Anesthesia Assessment:                       - Prior to the procedure, a History and Physical was                        performed, and patient medications and allergies were                        reviewed. The patient is competent. The risks and                        benefits of the procedure and the sedation options and                        risks were discussed with the patient. All questions                        were answered and informed consent was obtained.                        Patient identification and proposed procedure were                        verified by the physician, the nurse, the                        anesthesiologist, the anesthetist and the technician in                        the pre-procedure area in the procedure room in the                        endoscopy suite. Mental Status Examination: alert and                        oriented. Airway Examination: normal oropharyngeal                        airway and neck mobility. Respiratory Examination:                        clear to auscultation. CV Examination: normal.                        Prophylactic Antibiotics: The patient does not require  prophylactic antibiotics. Prior Anticoagulants: The                        patient has taken no previous anticoagulant or                        antiplatelet agents. ASA  Grade Assessment: III - A                        patient with severe systemic disease. After reviewing                        the risks and benefits, the patient was deemed in                        satisfactory condition to undergo the procedure. The                        anesthesia plan was to use monitored anesthesia care                        (MAC). Immediately prior to administration of                        medications, the patient was re-assessed for adequacy                        to receive sedatives. The heart rate, respiratory rate,                        oxygen saturations, blood pressure, adequacy of                        pulmonary ventilation, and response to care were                        monitored throughout the procedure. The physical status                        of the patient was re-assessed after the procedure.                       After obtaining informed consent, the endoscope was                        passed under direct vision. Throughout the procedure,                        the patient's blood pressure, pulse, and oxygen                        saturations were monitored continuously. The Endoscope                        was introduced through the mouth, and advanced to the                        second part of duodenum. The upper GI endoscopy was  accomplished without difficulty. The patient tolerated                        the procedure well. Findings:      The duodenal bulb and second portion of the duodenum were normal.       Biopsies for histology were taken with a cold forceps for evaluation of       celiac disease.      The entire examined stomach was normal. Biopsies were taken with a cold       forceps for Helicobacter pylori testing.      The cardia and gastric fundus were normal on retroflexion.      LA Grade C (one or more mucosal breaks continuous between tops of 2 or       more mucosal folds, less than 75%  circumference) esophagitis with no       bleeding was found in the lower third of the esophagus. Impression:           - Normal duodenal bulb and second portion of the                        duodenum. Biopsied.                       - Normal stomach. Biopsied.                       - LA Grade C reflux esophagitis. Recommendation:       - Await pathology results.                       - Use Prilosec (omeprazole) 40 mg PO BID for 4 weeks. Procedure Code(s):    --- Professional ---                       [phone], Esophagogastroduodenoscopy, flexible, transoral;                        with biopsy, single or multiple Diagnosis Code(s):    --- Professional ---                       K21.0, Gastro-esophageal reflux disease with esophagitis                       D50.0, Iron deficiency anemia secondary to blood loss                        (chronic) CPT copyright 2018 American Medical Association. All rights reserved. The codes documented in this report are preliminary and upon coder review may  be revised to meet current compliance requirements. Dr. Ulyess Mort Lin Landsman MD, MD 10/08/2018 2:45:30 PM This report has been signed electronically. Number of Addenda: 0 Note Initiated On: 10/08/2018 2:22 PM      [facility]

## 2018-10-08 NOTE — Anesthesia Postprocedure Evaluation (Signed)
Anesthesia Post Note  Patient: David Wong  Procedure(s) Performed: ESOPHAGOGASTRODUODENOSCOPY (EGD) WITH PROPOFOL (N/A ) COLONOSCOPY WITH PROPOFOL (N/A )  Patient location during evaluation: Endoscopy Anesthesia Type: General Level of consciousness: awake and alert and oriented Pain management: pain level controlled Vital Signs Assessment: post-procedure vital signs reviewed and stable Respiratory status: spontaneous breathing, nonlabored ventilation and respiratory function stable Cardiovascular status: blood pressure returned to baseline and stable Postop Assessment: no signs of nausea or vomiting Anesthetic complications: no     Last Vitals:  Vitals:   10/08/18 1513 10/08/18 1523  BP: 108/66 113/76  Pulse: 78 (!) 58  Resp: 15 14  Temp:    SpO2: 99% 100%    Last Pain:  Vitals:   10/08/18 1523  TempSrc:   PainSc: 0-No pain                 Michel Hendon

## 2018-10-09 ENCOUNTER — Encounter: Payer: Self-pay | Admitting: Gastroenterology

## 2018-10-09 ENCOUNTER — Other Ambulatory Visit: Payer: Self-pay

## 2018-10-11 ENCOUNTER — Ambulatory Visit: Payer: BLUE CROSS/BLUE SHIELD | Admitting: Gastroenterology

## 2018-10-11 ENCOUNTER — Encounter: Payer: Self-pay | Admitting: Gastroenterology

## 2018-10-11 ENCOUNTER — Other Ambulatory Visit: Payer: Self-pay

## 2018-10-11 VITALS — BP 133/92 | HR 88 | Ht 70.0 in | Wt 223.4 lb

## 2018-10-11 DIAGNOSIS — K625 Hemorrhage of anus and rectum: Secondary | ICD-10-CM | POA: Diagnosis not present

## 2018-10-11 DIAGNOSIS — K640 First degree hemorrhoids: Secondary | ICD-10-CM

## 2018-10-11 LAB — SURGICAL PATHOLOGY

## 2018-10-11 NOTE — Progress Notes (Signed)
PROCEDURE NOTE: The patient presents with symptomatic grade 1 hemorrhoids, unresponsive to maximal medical therapy, requesting rubber band ligation of his/her hemorrhoidal disease.  All risks, benefits and alternative forms of therapy were described and informed consent was obtained.  The decision was made to band the RP internal hemorrhoid, and the [place] was used to perform band ligation without complication.  Digital anorectal examination was then performed to assure proper positioning of the band, and to adjust the banded tissue as required.  The patient was discharged home without pain or other issues.  Dietary and behavioral recommendations were given and (if necessary - prescriptions were given), along with follow-up instructions.  The patient will return 2 weeks for follow-up and possible additional banding as required.  Patient did not complain of pain or pinching or cramps when I deployed the band.  Later, he started experiencing severe rectal pain associated with abdominal cramps.  Therefore, I readjusted the band and his pain was relieved.  He was reporting mild cramps, advised him to take Tylenol or Motrin His vitals have been stable.  I will not use Nitropaste during next banding procedure  [name], MD [address]  [place]  [place], [place] [address]  Main: [phone]  Fax: [phone] Pager: [phone]

## 2018-10-11 NOTE — Progress Notes (Signed)
David Darby, MD [address]  [place]  [place], [place] [address]  Main: [phone]  Fax: [phone] Pager: [phone]   Gastroenterology Consultation  Referring Provider:     Venita Lick, NP Primary Care Physician:  Venita Lick, NP Primary Gastroenterologist:  Dr. Jonathon Bellows  Reason for Consultation:     Iron deficiency anemia, rectal bleeding        HPI:   David Wong is a 33 y.o. y/o male referred for consultation & management  by Dr. Venita Lick, NP.    He has been referred for iron deficiency anemia. Hb was normal 2 years back. Sudden drop noted when checked 8 days back with Hb 9.5 grams with MCV 69. Labs were done as routine. Denies any NSAID use   Iron/TIBC/Ferritin/ %Sat    Component Value Date/Time   IRON 19 (L) 09/04/2017 1541   TIBC 405 09/04/2017 1541   FERRITIN 7 (L) 09/04/2017 1541   IRONPCTSAT 5 (LL) 09/04/2017 1541   urine analysis negative.   Rectal bleeding: yes- since a few months , in the toilet bowl , bright red, no diarrhea , no hard stools, no weight loss  Nose bleeds: no  Hematemesis or hemoptysis : no  Blood in urine : no   Follow-up visit 09/24/2018 Patient did not undergo EGD or colonoscopy as recommended by Dr. Vicente Males in 08/2017.  He said he was throwing up the bowel prep every time he was taking GoLYTELY.  He did not follow-up with him later.  Patient was seen as an urgent visit requested by his PCP today.  He has ongoing painless bright red bleeding per rectum, every time he has a bowel movement, dripping in the toilet bowl as well as spraying of the blood after the BM.  His anemia has been nicely improving to oral iron supplementation.  His iron levels are improving as well.  He has normal B12 and folate levels.  He continues to feel tired.  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  He denies any other hemorrhoidal symptoms.  Follow up visit  10/11/2018 Patient underwent EGD and colonoscopy which was unremarkable except for large internal hemorrhoids.  He does not have H. pylori or celiac disease.  He is here to discuss about hemorrhoid ligation.  He reports having ongoing painless rectal bleeding  He denies family history of GI malignancy.  Past Medical History:  Diagnosis Date  . Anemia   . Anxiety   . Depression     Past Surgical History:  Procedure Laterality Date  . COLONOSCOPY WITH PROPOFOL N/A 10/08/2018   Procedure: COLONOSCOPY WITH PROPOFOL;  Surgeon: Lin Landsman, MD;  Location[facility] ENDOSCOPY;  Service: Gastroenterology;  Laterality: N/A;  . ESOPHAGOGASTRODUODENOSCOPY (EGD) WITH PROPOFOL N/A 10/08/2018   Procedure: ESOPHAGOGASTRODUODENOSCOPY (EGD) WITH PROPOFOL;  Surgeon: Lin Landsman, MD;  Location: [facility] ENDOSCOPY;  Service: Gastroenterology;  Laterality: N/A;  . right pinky finger  2013   pins inserted    Current Outpatient Medications:  .  citalopram (CELEXA) 20 MG tablet, Take 1 tablet (20 mg total) by mouth daily., Disp: 30 tablet, Rfl: 6 .  Ferrous Sulfate (IRON PO), Take by mouth daily., Disp: , Rfl:  .  omeprazole (PRILOSEC) 40 MG capsule, Take 1 capsule (40 mg total) by mouth 2 (two) times daily before a meal for 30 days., Disp: 60 capsule, Rfl: 2   Family History  Problem Relation Age of Onset  . Hyperlipidemia Mother   . Hypertension Mother   . Hyperlipidemia Father   . Diabetes Father   . Hypertension Brother   . Arthritis Maternal Grandfather        RA  . Heart disease Paternal Grandfather      Social History   Tobacco Use  . Smoking status: Never Smoker  . Smokeless tobacco: Never Used  Substance Use Topics  . Alcohol use: Yes    Alcohol/week: 0.0 standard drinks    Comment: Rarely  . Drug use: No    Allergies as of 10/11/2018  . (No Known Allergies)    Review of Systems:    All systems reviewed and negative except where noted in HPI.   Physical Exam:  BP (!)  133/92   Pulse 88   Ht 5\' 10"  (1.778 m)   Wt 223 lb 6.4 oz (101.3 kg)   BMI 32.05 kg/m  No LMP for male patient. Psych:  Alert and cooperative. Normal mood and affect. General:   Alert,  Well-developed, well-nourished, pleasant and cooperative in NAD Head:  Normocephalic and atraumatic. Eyes:  Sclera clear, no icterus.   Conjunctiva pink. Ears:  Normal auditory acuity. Nose:  No deformity, discharge, or lesions. Mouth:  No deformity or lesions,oropharynx pink & moist. Neck:  Supple; no masses or thyromegaly. Lungs:  Respirations even and unlabored.  Clear throughout to auscultation.   No wheezes, crackles, or rhonchi. No acute distress. Heart:  Regular rate and rhythm; no murmurs, clicks, rubs, or gallops. Abdomen:  Normal bowel sounds.  No bruits.  Soft, non-tender and non-distended without masses, hepatosplenomegaly or hernias noted.  No guarding or rebound tenderness.    Msk:  Symmetrical without gross deformities. Good, equal movement & strength bilaterally. Skin:  Intact without significant lesions or rashes. No jaundice. Psych:  Alert and cooperative. Normal mood and affect.  Imaging Studies: No results found.  Assessment and Plan:   David Wong is a 33 y.o. male with no significant past medical history, presents for follow-up of severe iron deficiency anemia in setting of chronic painless bright red bleeding per rectum.  He underwent EGD and colonoscopy which were unrevealing except for large internal hemorrhoids.   Plan  1. Discussed with him about outpatient hemorrhoid ligation today, risks and benefits discussed, consent obtained Perform hemorrhoid ligation today  Follow up in 2-3 weeks

## 2018-10-17 ENCOUNTER — Telehealth: Payer: Self-pay | Admitting: Gastroenterology

## 2018-10-17 NOTE — Telephone Encounter (Signed)
No vm set up pt needs to r/s apt due to corona virus  And call office if continues to bleed

## 2018-10-18 ENCOUNTER — Ambulatory Visit: Payer: BLUE CROSS/BLUE SHIELD | Admitting: Nurse Practitioner

## 2018-10-24 ENCOUNTER — Ambulatory Visit: Payer: Self-pay | Admitting: Gastroenterology

## 2018-12-10 ENCOUNTER — Ambulatory Visit: Payer: Self-pay | Admitting: Gastroenterology

## 2019-01-20 ENCOUNTER — Ambulatory Visit: Payer: BC Managed Care – PPO | Admitting: Gastroenterology

## 2019-01-20 ENCOUNTER — Encounter: Payer: Self-pay | Admitting: Gastroenterology

## 2019-01-20 ENCOUNTER — Other Ambulatory Visit: Payer: Self-pay

## 2019-01-20 VITALS — BP 111/73 | HR 71 | Temp 98.4°F | Ht 70.0 in | Wt 226.8 lb

## 2019-01-20 DIAGNOSIS — K648 Other hemorrhoids: Secondary | ICD-10-CM | POA: Diagnosis not present

## 2019-01-20 NOTE — Progress Notes (Signed)
PROCEDURE NOTE:  Patient reported that bleeding has somewhat improved  The patient presents with symptomatic grade 1 hemorrhoids, unresponsive to maximal medical therapy, requesting rubber band ligation of his/her hemorrhoidal disease.  All risks, benefits and alternative forms of therapy were described and informed consent was obtained.   The decision was made to band the RA internal hemorrhoid, and the [place] was used to perform band ligation without complication.  Digital anorectal examination was then performed to assure proper positioning of the band, and to adjust the banded tissue as required.  The patient was discharged home without pain or other issues.  Dietary and behavioral recommendations were given and (if necessary - prescriptions were given), along with follow-up instructions.  The patient will return 2 weeks for follow-up and possible additional banding as required.  No complications were encountered and the patient tolerated the procedure well.  [name], MD [address]  [place]  [place],  [address]  Main: [phone]  Fax: [phone] Pager: [phone]

## 2019-01-23 ENCOUNTER — Other Ambulatory Visit: Payer: Self-pay

## 2019-01-23 ENCOUNTER — Telehealth: Payer: Self-pay | Admitting: Gastroenterology

## 2019-01-23 ENCOUNTER — Ambulatory Visit (INDEPENDENT_AMBULATORY_CARE_PROVIDER_SITE_OTHER): Payer: BC Managed Care – PPO | Admitting: Gastroenterology

## 2019-01-23 ENCOUNTER — Other Ambulatory Visit
Admission: RE | Admit: 2019-01-23 | Discharge: 2019-01-23 | Disposition: A | Discharge status: Home / Self Care | Payer: BC Managed Care – PPO | Source: Ambulatory Visit | Attending: Gastroenterology | Admitting: Gastroenterology

## 2019-01-23 DIAGNOSIS — D500 Iron deficiency anemia secondary to blood loss (chronic): Secondary | ICD-10-CM

## 2019-01-23 DIAGNOSIS — D509 Iron deficiency anemia, unspecified: Secondary | ICD-10-CM | POA: Insufficient documentation

## 2019-01-23 DIAGNOSIS — K625 Hemorrhage of anus and rectum: Secondary | ICD-10-CM | POA: Diagnosis not present

## 2019-01-23 DIAGNOSIS — K640 First degree hemorrhoids: Secondary | ICD-10-CM

## 2019-01-23 LAB — CBC
HCT: 36.1 % — ABNORMAL LOW (ref 39.0–52.0)
Hemoglobin: 10.6 g/dL — ABNORMAL LOW (ref 13.0–17.0)
MCH: 22.8 pg — ABNORMAL LOW (ref 26.0–34.0)
MCHC: 29.4 g/dL — ABNORMAL LOW (ref 30.0–36.0)
MCV: 77.6 fL — ABNORMAL LOW (ref 80.0–100.0)
Platelets: 281 10*3/uL (ref 150–400)
RBC: 4.65 MIL/uL (ref 4.22–5.81)
RDW: 16.9 % — ABNORMAL HIGH (ref 11.5–15.5)
WBC: 9.7 10*3/uL (ref 4.0–10.5)
nRBC: 0 % (ref 0.0–0.2)

## 2019-01-23 LAB — IRON AND TIBC
Iron: 34 ug/dL — ABNORMAL LOW (ref 45–182)
Saturation Ratios: 8 % — ABNORMAL LOW (ref 17.9–39.5)
TIBC: 450 ug/dL (ref 250–450)
UIBC: 416 ug/dL

## 2019-01-23 LAB — FERRITIN: Ferritin: 8 ng/mL — ABNORMAL LOW (ref 24–336)

## 2019-01-23 MED ORDER — FUSION PLUS PO CAPS: 1.0000 | ORAL_CAPSULE | Freq: Every day | ORAL | 2 refills | Status: AC

## 2019-01-23 NOTE — Telephone Encounter (Signed)
Called patient to discuss about lab results from today.  He has severe iron deficiency anemia.  He is currently taking 65 mg elemental iron 1 pill daily.  Advised him to try fusion plus 1 pill a day and he is willing to do so.  Sent prescription to his pharmacy  Recheck CBC, iron studies in 2 months or sooner if he continues to have rectal bleeding

## 2019-01-23 NOTE — Progress Notes (Signed)
   David Darby, MD [address]  [place]  [place], [place] [address]  Main: [phone]  Fax: [phone] Pager: [phone]   Primary Care Physician: Venita Lick, NP  Primary Gastroenterologist:  Dr. Cephas Wong  Chief Complaint  Patient presents with  . Rectal Bleeding  . Rectal Pain    HPI: David Wong is a 33 y.o. male with severe iron deficiency anemia secondary to bleeding internal hemorrhoids.  He underwent hemorrhoid ligation of the right anterior hemorrhoid 3 days ago.  He reports that he started noticing rectal bleeding since next day.  He did not have bowel movement yet.  He reports that whenever he tries to have a bowel movement, he notices bright red blood per rectum.  He feels like the blood was playing into the toilet bowel.  He denies any blood clots.  He started taking MiraLAX since yesterday.  His last episode of rectal bleeding was this morning, he felt dizzy and nauseous  Current Outpatient Medications  Medication Sig Dispense Refill  . citalopram (CELEXA) 20 MG tablet Take 1 tablet (20 mg total) by mouth daily. 30 tablet 6  . Ferrous Sulfate (IRON PO) Take by mouth daily.    Marland Kitchen omeprazole (PRILOSEC) 40 MG capsule Take 1 capsule (40 mg total) by mouth 2 (two) times daily before a meal for 30 days. 60 capsule 2   No current facility-administered medications for this visit.     Allergies as of 01/23/2019  . (No Known Allergies)   ROS:  General: Negative for anorexia, weight loss, fever, chills, fatigue, weakness. ENT: Negative for hoarseness, difficulty swallowing , nasal congestion. CV: Negative for chest pain, angina, palpitations, dyspnea on exertion, peripheral edema.  Respiratory: Negative for dyspnea at rest, dyspnea on exertion, cough, sputum, wheezing.  GI: See history of present illness. GU:  Negative for dysuria, hematuria, urinary incontinence, urinary frequency, nocturnal urination.  Endo: Negative for unusual weight  change.    Physical Examination:  General: Well-nourished, well-developed in no acute distress.  Eyes: No icterus. Conjunctivae pink. Mouth: Oropharyngeal mucosa moist and pink , no lesions erythema or exudate. Lungs: Clear to auscultation bilaterally. Non-labored. Heart: Regular rate and rhythm, no murmurs rubs or gallops.  Abdomen: Bowel sounds are normal, nontender, nondistended, no hepatosplenomegaly or masses, no hernia , no rebound or guarding.   Rectal: Digital rectal exam revealed that the band fell off, mildly tender.  Anoscopy was performed which showed the most recent banding site on the right anterior hemorrhoid which was red.  I did not see any blood clot or old blood.  There was no band Extremities: No lower extremity edema. No clubbing or deformities. Neuro: Alert and oriented x 3.  Grossly intact. Skin: Warm and dry, no jaundice.   Psych: Alert and cooperative, normal mood and affect.   Imaging Studies: No results found.  Assessment and Plan:   David Wong is a 33 y.o. male with bleeding internal hemorrhoids leading to iron deficiency anemia, seen for follow-up of rectal bleeding and discomfort after rubber band ligation of the right anterior hemorrhoid on 01/20/2019  Check CBC, iron studies today Advised him to use increase MiraLAX to twice daily, given him Fiberchoice samples Add more fiber in his diet, plenty of water intake Strongly advised him not to push or spend too much time on the toilet  Follow up in 3 weeks   Dr Sherri Sear, MD

## 2019-01-27 ENCOUNTER — Telehealth: Payer: Self-pay | Admitting: Gastroenterology

## 2019-01-27 NOTE — Telephone Encounter (Signed)
Renee with [facility] left message stating that the Fusion + caps was on back order until 02-07-19. Did you want to substitute it for something else until then.

## 2019-01-30 NOTE — Telephone Encounter (Signed)
Pt will pick up samples from office on Monday 02/03/2019

## 2019-01-30 NOTE — Telephone Encounter (Signed)
Please advise did you want to replace this medication with anything else?

## 2019-01-30 NOTE — Telephone Encounter (Signed)
Can he pick up some samples until then  RV

## 2019-02-03 ENCOUNTER — Ambulatory Visit: Payer: BC Managed Care – PPO | Admitting: Gastroenterology

## 2019-02-07 ENCOUNTER — Ambulatory Visit: Payer: Self-pay | Admitting: Gastroenterology

## 2019-02-07 NOTE — Telephone Encounter (Signed)
Renee with [facility] called stating the patient called her checking to see if they has gotten in the Fusion Plus. This is on back order until sometime in August.

## 2019-02-17 ENCOUNTER — Other Ambulatory Visit: Payer: Self-pay

## 2019-02-17 ENCOUNTER — Encounter: Payer: Self-pay | Admitting: Gastroenterology

## 2019-02-17 ENCOUNTER — Ambulatory Visit: Payer: BC Managed Care – PPO | Admitting: Gastroenterology

## 2019-02-17 VITALS — BP 115/78 | HR 130 | Temp 98.8°F | Resp 18 | Ht 70.0 in | Wt 228.8 lb

## 2019-02-17 DIAGNOSIS — D500 Iron deficiency anemia secondary to blood loss (chronic): Secondary | ICD-10-CM

## 2019-02-17 NOTE — Progress Notes (Signed)
[name], MD [address]  [place]  [place], [place] [address]  Main: [phone]  Fax: [phone] Pager: [phone]   Primary Care Physician: Venita Lick, NP  Primary Gastroenterologist:  Dr. [name]  Chief Complaint  Patient presents with  . Follow-up    Banding #3    HPI: David Wong is a 33 y.o. male with severe iron deficiency anemia secondary to bleeding internal hemorrhoids.  He underwent hemorrhoid ligation of the right anterior hemorrhoid 3 days ago.  He reports that he started noticing rectal bleeding since next day.  He did not have bowel movement yet.  He reports that whenever he tries to have a bowel movement, he notices bright red blood per rectum.  He feels like the blood was playing into the toilet bowel.  He denies any blood clots.  He started taking MiraLAX since yesterday.  His last episode of rectal bleeding was this morning, he felt dizzy and nauseous  Follow-up visit 02/17/2019 Since his last severe bleeding episode, he reports having scant blood on wiping intermittently.  He does report mild stinging sensation right after the bowel movement that lasts very briefly.  He started taking oral iron double strength 1 pill a day, [employer] brand.  He could not get fusion plus as his pharmacy is out of supply. He denies constipation.  Current Outpatient Medications  Medication Sig Dispense Refill  . citalopram (CELEXA) 20 MG tablet Take 1 tablet (20 mg total) by mouth daily. 30 tablet 6  . Ferrous Sulfate (IRON PO) Take by mouth daily.    . Iron-FA-B Cmp-C-Biot-Probiotic (FUSION PLUS) CAPS Take 1 capsule by mouth daily with breakfast for 30 days. 30 capsule 2  . omeprazole (PRILOSEC) 40 MG capsule Take 1 capsule (40 mg total) by mouth 2 (two) times daily before a meal for 30 days. 60 capsule 2   No current facility-administered medications for this visit.     Allergies as of 02/17/2019  . (No Known Allergies)   ROS:  General:  Negative for anorexia, weight loss, fever, chills, fatigue, weakness. ENT: Negative for hoarseness, difficulty swallowing , nasal congestion. CV: Negative for chest pain, angina, palpitations, dyspnea on exertion, peripheral edema.  Respiratory: Negative for dyspnea at rest, dyspnea on exertion, cough, sputum, wheezing.  GI: See history of present illness. GU:  Negative for dysuria, hematuria, urinary incontinence, urinary frequency, nocturnal urination.  Endo: Negative for unusual weight change.    Physical Examination:  General: Well-nourished, well-developed in no acute distress.  Eyes: No icterus. Conjunctivae pink. Mouth: Oropharyngeal mucosa moist and pink , no lesions erythema or exudate. Lungs: Clear to auscultation bilaterally. Non-labored. Heart: Regular rate and rhythm, no murmurs rubs or gallops.  Abdomen: Bowel sounds are normal, nontender, nondistended, no hepatosplenomegaly or masses, no hernia , no rebound or guarding.   Rectal: Palpable right posterior external hemorrhoid Extremities: No lower extremity edema. No clubbing or deformities. Neuro: Alert and oriented x 3.  Grossly intact. Skin: Warm and dry, no jaundice.   Psych: Alert and cooperative, normal mood and affect.   Imaging Studies: No results found.  Assessment and Plan:   David Wong is a 33 y.o. male with bleeding internal hemorrhoids leading to iron deficiency anemia, seen for follow-up of iron deficiency anemia and hemorrhoid ligation  Iron deficiency anemia secondary to bleeding internal hemorrhoids EGD and colonoscopy were unremarkable including biopsies for celiac disease and H. pylori infection. Patient is undergoing hemorrhoid ligation Continue oral iron  Check CBC, iron studies Avoid constipation  Internal hemorrhoids Perform hemorrhoid ligation today  Follow up in 3 months   Dr Sherri Sear, MD

## 2019-02-17 NOTE — Progress Notes (Signed)

## 2019-03-10 ENCOUNTER — Emergency Department
Admission: EM | Admit: 2019-03-10 | Discharge: 2019-03-10 | Disposition: A | Discharge status: Home / Self Care | Payer: Worker's Compensation | Attending: Emergency Medicine | Admitting: Emergency Medicine

## 2019-03-10 ENCOUNTER — Encounter: Payer: Self-pay | Admitting: Emergency Medicine

## 2019-03-10 ENCOUNTER — Other Ambulatory Visit: Payer: Self-pay

## 2019-03-10 DIAGNOSIS — Y929 Unspecified place or not applicable: Secondary | ICD-10-CM | POA: Insufficient documentation

## 2019-03-10 DIAGNOSIS — Z79899 Other long term (current) drug therapy: Secondary | ICD-10-CM | POA: Diagnosis not present

## 2019-03-10 DIAGNOSIS — W269XXA Contact with unspecified sharp object(s), initial encounter: Secondary | ICD-10-CM | POA: Insufficient documentation

## 2019-03-10 DIAGNOSIS — S61411A Laceration without foreign body of right hand, initial encounter: Secondary | ICD-10-CM | POA: Diagnosis not present

## 2019-03-10 DIAGNOSIS — Y939 Activity, unspecified: Secondary | ICD-10-CM | POA: Insufficient documentation

## 2019-03-10 DIAGNOSIS — Y990 Civilian activity done for income or pay: Secondary | ICD-10-CM | POA: Insufficient documentation

## 2019-03-10 MED ORDER — CEPHALEXIN 500 MG PO CAPS: 1000.0000 mg | ORAL_CAPSULE | Freq: Two times a day (BID) | ORAL | 0 refills | Status: DC

## 2019-03-10 MED ORDER — LIDOCAINE HCL (PF) 1 % IJ SOLN
10.0000 mL | Freq: Once | INTRAMUSCULAR | Status: AC
  Administered 2019-03-10: 10 mL
  Filled 2019-03-10: qty 10

## 2019-03-10 NOTE — ED Provider Notes (Signed)
[facility] Emergency Department Provider Note  ____________________________________________  Time seen: Approximately 3:18 PM  I have reviewed the triage vital signs and the nursing notes.   HISTORY  Chief Complaint Laceration    HPI David Wong is a 33 y.o. male who presents emergency department complaining of a laceration to the dorsal aspect of his right hand.  Patient reports that he was at work, accidentally cut his hand.  He reports a laceration between the MCP joints of the third and fourth digit.  No active bleeding.  Patient denies any report visible foreign body.  Full range of motion all 5 digits.  Last tetanus shot was approximately 4 years ago.  No other injury or complaint at this time.         Past Medical History:  Diagnosis Date  . Anemia   . Anxiety   . Depression     Patient Active Problem List   Diagnosis Date Noted  . Rectal bleeding   . Laceration of right thumb without foreign body with damage to nail 09/23/2018  . Iron deficiency anemia 09/04/2017  . Depression, major, single episode, in partial remission ([place]) 06/29/2017  . Fatigue 06/29/2017  . Sleep apnea 06/29/2017  . Anxiety 01/08/2015  . Clinical depression 01/08/2015  . Insomnia 01/08/2015    Past Surgical History:  Procedure Laterality Date  . COLONOSCOPY WITH PROPOFOL N/A 10/08/2018   Procedure: COLONOSCOPY WITH PROPOFOL;  Surgeon: Lin Landsman, MD;  Location: [facility] ENDOSCOPY;  Service: Gastroenterology;  Laterality: N/A;  . ESOPHAGOGASTRODUODENOSCOPY (EGD) WITH PROPOFOL N/A 10/08/2018   Procedure: ESOPHAGOGASTRODUODENOSCOPY (EGD) WITH PROPOFOL;  Surgeon: Lin Landsman, MD;  Location: [facility] ENDOSCOPY;  Service: Gastroenterology;  Laterality: N/A;  . right pinky finger  2013   pins inserted    Prior to Admission medications   Medication Sig Start Date End Date Taking? Authorizing Provider  cephALEXin (KEFLEX) 500 MG capsule Take 2 capsules  (1,000 mg total) by mouth 2 (two) times daily. 03/10/19   Cordero Surette, Charline Bills, PA-C  citalopram (CELEXA) 20 MG tablet Take 1 tablet (20 mg total) by mouth daily. 07/18/18   Marnee Guarneri T, NP  Ferrous Sulfate (IRON PO) Take by mouth daily.    [provider]  omeprazole (PRILOSEC) 40 MG capsule Take 1 capsule (40 mg total) by mouth 2 (two) times daily before a meal for 30 days. 10/08/18 11/07/18  Lin Landsman, MD    Allergies Patient has no known allergies.  Family History  Problem Relation Age of Onset  . Hyperlipidemia Mother   . Hypertension Mother   . Hyperlipidemia Father   . Diabetes Father   . Hypertension Brother   . Arthritis Maternal Grandfather        RA  . Heart disease Paternal Grandfather     Social History Social History   Tobacco Use  . Smoking status: Never Smoker  . Smokeless tobacco: Never Used  Substance Use Topics  . Alcohol use: Yes    Alcohol/week: 0.0 standard drinks    Comment: Rarely  . Drug use: No     Review of Systems  Constitutional: No fever/chills Eyes: No visual changes. No discharge ENT: No upper respiratory complaints. Cardiovascular: no chest pain. Respiratory: no cough. No SOB. Gastrointestinal: No abdominal pain.  No nausea, no vomiting. Musculoskeletal: Negative for musculoskeletal pain. Skin: Laceration to the right hand Neurological: Negative for headaches, focal weakness or numbness. 10-point ROS otherwise negative.  ____________________________________________   PHYSICAL EXAM:  VITAL  SIGNS: ED Triage Vitals  Enc Vitals Group     BP 03/10/19 1434 (!) 149/88     Pulse Rate 03/10/19 1434 95     Resp 03/10/19 1434 16     Temp 03/10/19 1434 98.5 F (36.9 C)     Temp Source 03/10/19 1434 Oral     SpO2 03/10/19 1434 99 %     Weight 03/10/19 1435 225 lb (102.1 kg)     Height 03/10/19 1435 5\' 11"  (1.803 m)     Head Circumference --      Peak Flow --      Pain Score 03/10/19 1429 4     Pain Loc --       Pain Edu? --      Excl. in [place]? --      Constitutional: Alert and oriented. Well appearing and in no acute distress. Eyes: Conjunctivae are normal. PERRL. EOMI. Head: Atraumatic. Neck: No stridor.    Cardiovascular: Normal rate, regular rhythm. Normal S1 and S2.  Good peripheral circulation. Respiratory: Normal respiratory effort without tachypnea or retractions. Lungs CTAB. Good air entry to the bases with no decreased or absent breath sounds. Musculoskeletal: Full range of motion to all extremities. No gross deformities appreciated.  Visualization of the right hand reveals 1.5 cm laceration to the dorsal aspect between the MCP joints of the third and fourth digit.  No active bleeding.  No visible foreign body.  Patient has good extension and flexion of both the third and fourth digit.  Sensation and capillary refill intact to all digits.  Edges are smooth in nature.  Extends into the subcutaneous tissue.  No evidence of tendon or muscle involvement. Neurologic:  Normal speech and language. No gross focal neurologic deficits are appreciated.  Skin:  Skin is warm, dry and intact. No rash noted. Psychiatric: Mood and affect are normal. Speech and behavior are normal. Patient exhibits appropriate insight and judgement.   ____________________________________________   LABS (all labs ordered are listed, but only abnormal results are displayed)  Labs Reviewed - No data to display ____________________________________________  EKG   ____________________________________________  RADIOLOGY   No results found.  ____________________________________________    PROCEDURES  Procedure(s) performed:    Marland KitchenMarland KitchenLaceration Repair  Date/Time: 03/10/2019 4:48 PM Performed by: Darletta Moll, PA-C Authorized by: Darletta Moll, PA-C   Consent:    Consent obtained:  Verbal   Consent given by:  Patient   Risks discussed:  Pain Anesthesia (see MAR for exact dosages):     Anesthesia method:  Local infiltration   Local anesthetic:  Lidocaine 1% w/o epi Laceration details:    Location:  Hand   Hand location:  R hand, dorsum   Length (cm):  1.5 Repair type:    Repair type:  Simple Pre-procedure details:    Preparation:  Patient was prepped and draped in usual sterile fashion Exploration:    Hemostasis achieved with:  Direct pressure   Wound exploration: wound explored through full range of motion and entire depth of wound probed and visualized     Wound extent: no foreign bodies/material noted, no muscle damage noted, no nerve damage noted, no tendon damage noted, no underlying fracture noted and no vascular damage noted     Contaminated: no   Treatment:    Area cleansed with:  Betadine and saline   Amount of cleaning:  Standard   Irrigation solution:  Sterile saline   Irrigation volume:  500 ml   Irrigation method:  Syringe  Skin repair:    Repair method:  Sutures   Suture size:  4-0   Suture material:  Nylon   Suture technique:  Simple interrupted   Number of sutures:  4 Approximation:    Approximation:  Close Post-procedure details:    Dressing:  Open (no dressing)   Patient tolerance of procedure:  Tolerated well, no immediate complications      Medications  lidocaine (PF) (XYLOCAINE) 1 % injection 10 mL (10 mLs Infiltration Given by Other 03/10/19 1642)     ____________________________________________   INITIAL IMPRESSION / ASSESSMENT AND PLAN / ED COURSE  Pertinent labs & imaging results that were available during my care of the patient were reviewed by me and considered in my medical decision making (see chart for details).  Review of the [place] CSRS was performed in accordance of the [place] prior to dispensing any controlled drugs.           Patient's diagnosis is consistent with hand laceration P patient presents emergency department with complaint of laceration sustained while at work.  Patient had a 1.5 cm laceration located  between the MCP joint of the third and fourth digit.  No active bleeding.  No visible foreign body.  Repaired as described above.  Wound care instructions discussed with patient.  Follow-up with primary care or urgent care in 1 week for suture removal.  Patient was placed on antibiotics prophylactically..Patient is given ED precautions to return to the ED for any worsening or new symptoms.     ____________________________________________  FINAL CLINICAL IMPRESSION(S) / ED DIAGNOSES  Final diagnoses:  Laceration of right hand without foreign body, initial encounter      NEW MEDICATIONS STARTED DURING THIS VISIT:  ED Discharge Orders         Ordered    cephALEXin (KEFLEX) 500 MG capsule  2 times daily     03/10/19 1651              This chart was dictated using voice recognition software/Dragon. Despite best efforts to proofread, errors can occur which can change the meaning. Any change was purely unintentional.    Darletta Moll, PA-C 03/10/19 1656    Nena Polio, MD 03/10/19 2239

## 2019-03-10 NOTE — ED Notes (Signed)
See triage note  Presents from work with laceration to right hand  States wrench slipped

## 2019-03-10 NOTE — ED Triage Notes (Signed)
Laceration to right hand °

## 2019-03-17 ENCOUNTER — Other Ambulatory Visit: Payer: Self-pay

## 2019-03-17 ENCOUNTER — Ambulatory Visit: Payer: BC Managed Care – PPO | Admitting: Nurse Practitioner

## 2019-03-17 ENCOUNTER — Encounter: Payer: Self-pay | Admitting: Nurse Practitioner

## 2019-03-17 VITALS — BP 124/81 | HR 83 | Temp 98.9°F

## 2019-03-17 DIAGNOSIS — Z4802 Encounter for removal of sutures: Secondary | ICD-10-CM

## 2019-03-17 NOTE — Assessment & Plan Note (Signed)
X 4 sutures removed to dorsal aspect right hand, intact.  Educated patient on wound care and discussed allowing steri strips to come off on own. To place abx ointment to wound twice a day.  Return to office for any increased pain, redness, tenderness, or warmth to area.

## 2019-03-17 NOTE — Progress Notes (Signed)
BP 124/81   Pulse 83   Temp 98.9 F (37.2 C) (Oral)   SpO2 98%    Subjective:    Patient ID: David Wong, male    DOB: 07-May-1986, 33 y.o.   MRN: [id number]  HPI: David Wong is a 33 y.o. male  Chief Complaint  Patient presents with  . Suture / Staple Removal    4 sutures in right hand   SUTURE REMOVAL: Was seen in ER 03/10/2019 for laceration to dorsal aspect of left hand, sustained while working on car.  UTD on tetanus, last 4 years ago.  Was placed on prophylactic Keflex.  At this time reports some mild tenderness to area, but has been taking Tylenol, which he states helps with this.  Denies any drainage to area, warmth, fever, or erythema.  Four sutures placed by PA-C in ER.  No imaging obtained, patient denies any further injuries during incident other than laceration.  Reports full range of motion of hand and has been using at work.  Has been wearing glove at work to keep wound site covered.  Relevant past medical, surgical, family and social history reviewed and updated as indicated. Interim medical history since our last visit reviewed. Allergies and medications reviewed and updated.  Review of Systems  Constitutional: Negative for activity change, diaphoresis, fatigue and fever.  Respiratory: Negative for cough, chest tightness, shortness of breath and wheezing.   Cardiovascular: Negative for chest pain, palpitations and leg swelling.  Gastrointestinal: Negative for abdominal distention, abdominal pain, constipation, diarrhea, nausea and vomiting.  Skin: Positive for wound.    Per HPI unless specifically indicated above     Objective:    BP 124/81   Pulse 83   Temp 98.9 F (37.2 C) (Oral)   SpO2 98%   Wt Readings from Last 3 Encounters:  03/10/19 225 lb (102.1 kg)  02/17/19 228 lb 12.8 oz (103.8 kg)  01/20/19 226 lb 12.8 oz (102.9 kg)    Physical Exam Vitals signs and nursing note reviewed.  Constitutional:      General: He is awake. He is not in  acute distress.    Appearance: He is well-developed. He is not ill-appearing.  HENT:     Head: Normocephalic and atraumatic.     Right Ear: Hearing normal. No drainage.     Left Ear: Hearing normal. No drainage.  Eyes:     General: Lids are normal.        Right eye: No discharge.        Left eye: No discharge.     Conjunctiva/sclera: Conjunctivae normal.     Pupils: Pupils are equal, round, and reactive to light.  Neck:     Musculoskeletal: Normal range of motion and neck supple.  Cardiovascular:     Rate and Rhythm: Normal rate and regular rhythm.     Heart sounds: Normal heart sounds, S1 normal and S2 normal. No murmur. No gallop.   Pulmonary:     Effort: Pulmonary effort is normal. No accessory muscle usage or respiratory distress.     Breath sounds: Normal breath sounds.  Abdominal:     General: Bowel sounds are normal.     Palpations: Abdomen is soft.  Musculoskeletal: Normal range of motion.     Right hand: He exhibits laceration. He exhibits normal range of motion, no tenderness and normal capillary refill. Normal strength noted.     Left hand: He exhibits normal range of motion, no tenderness, normal capillary refill, no  laceration and no swelling. Normal strength noted.     Right lower leg: No edema.     Left lower leg: No edema.  Skin:    General: Skin is warm and dry.     Findings: Wound present.     Comments: Approx. 1.5 cm laceration between MCP joint 3rd and 4th finger right hand.  X 4 sutures tightly in place with wound well-approximated.  Sutures removed, four removed and intact.  Wound with small amount of serous drainage present.  X 2 steri strips placed over area + bandage with Neosporin.  Wound with mild erythema around exterior, but no warmth or tenderness on palpation.    Neurological:     Mental Status: He is alert and oriented to person, place, and time.  Psychiatric:        Mood and Affect: Mood normal.        Behavior: Behavior normal. Behavior is  cooperative.        Thought Content: Thought content normal.        Judgment: Judgment normal.     Results for orders placed or performed during the hospital encounter of 01/23/19  Ferritin  Result Value Ref Range   Ferritin 8 (L) 24 - 336 ng/mL  Iron and TIBC  Result Value Ref Range   Iron 34 (L) 45 - 182 ug/dL   TIBC 450 250 - 450 ug/dL   Saturation Ratios 8 (L) 17.9 - 39.5 %   UIBC 416 ug/dL  CBC  Result Value Ref Range   WBC 9.7 4.0 - 10.5 K/uL   RBC 4.65 4.22 - 5.81 MIL/uL   Hemoglobin 10.6 (L) 13.0 - 17.0 g/dL   HCT 36.1 (L) 39.0 - 52.0 %   MCV 77.6 (L) 80.0 - 100.0 fL   MCH 22.8 (L) 26.0 - 34.0 pg   MCHC 29.4 (L) 30.0 - 36.0 g/dL   RDW 16.9 (H) 11.5 - 15.5 %   Platelets 281 150 - 400 K/uL   nRBC 0.0 0.0 - 0.2 %      Assessment & Plan:   Problem List Items Addressed This Visit      Other   Encounter for removal of sutures - Primary    X 4 sutures removed to dorsal aspect right hand, intact.  Educated patient on wound care and discussed allowing steri strips to come off on own. To place abx ointment to wound twice a day.  Return to office for any increased pain, redness, tenderness, or warmth to area.            Follow up plan: Return if symptoms worsen or fail to improve.

## 2019-03-17 NOTE — Patient Instructions (Signed)
Laceration Care, Adult A laceration is a cut that may go through all layers of the skin. The cut may also go into the tissue that is right under the skin. Some cuts heal on their own. Others need to be closed with stitches (sutures), staples, skin adhesive strips, or skin glue. Taking care of your injury lowers your risk of infection, helps your injury to heal better, and may prevent scarring. Supplies needed:  Soap.  Water.  Hand sanitizer.  Bandage (dressing).  Antibiotic ointment.  Clean towel. How to take care of your cut Wash your hands with soap and water before touching your wound or changing your bandage. If soap and water are not available, use hand sanitizer. If your doctor used stitches or staples:  Keep the wound clean and dry.  If you were given a bandage, change it at least once a day as told by your doctor. You should also change it if it gets wet or dirty.  Keep the wound completely dry for the first 24 hours, or as told by your doctor. After that, you may take a shower or a bath. Do not get the wound soaked in water until after the stitches or staples have been removed.  Clean the wound once a day, or as told by your doctor: ? Wash the wound with soap and water. ? Rinse the wound with water to remove all soap. ? Pat the wound dry with a clean towel. Do not rub the wound.  After you clean the wound, put a thin layer of antibiotic ointment on it as told by your doctor. This ointment: ? Helps to prevent infection. ? Keeps the bandage from sticking to the wound.  Have your stitches or staples removed as told by your doctor. If your doctor used skin adhesive strips:  Keep the wound clean and dry.  If you were given a bandage, you should change it at least once a day as told by your doctor. You should also change it if it gets wet or dirty.  Do not get the skin adhesive strips wet. You can take a shower or a bath, but keep the wound dry.  If the wound gets wet,  pat it dry with a clean towel. Do not rub the wound.  Skin adhesive strips fall off on their own. You can trim the strips as the wound heals. Do not remove any strips that are still stuck to the wound. They will fall off after a while. If your doctor used skin glue:  Try to keep your wound dry, but you may briefly wet it in the shower or bath. Do not soak the wound in water, such as by swimming.  After you take a shower or a bath, gently pat the wound dry with a clean towel. Do not rub the wound.  Do not do any activities that will make you really sweaty until the skin glue has fallen off on its own.  Do not apply liquid, cream, or ointment medicine to your wound while the skin glue is still on.  If you were given a bandage, you should change it at least once a day or as told by your doctor. You should also change it if it gets dirty or wet.  If a bandage is placed over the wound, do not let the tape touch the skin glue.  Do not pick at the glue. The skin glue usually stays on for 5-10 days. Then, it falls off the skin. General   instructions   Take over-the-counter and prescription medicines only as told by your doctor.  If you were given antibiotic medicine or ointment, take or apply it as told by your doctor. Do not stop using it even if your condition improves.  Do not scratch or pick at the wound.  Check your wound every day for signs of infection. Watch for: ? Redness, swelling, or pain. ? Fluid, blood, or pus.  Raise (elevate) the injured area above the level of your heart while you are sitting or lying down.  If directed, put ice on the affected area: ? Put ice in a plastic bag. ? Place a towel between your skin and the bag. ? Leave the ice on for 20 minutes, 2-3 times a day.  Prevent scarring by covering your wound with sunscreen of at least 30 SPF whenever you are outside after your wound has healed.  Keep all follow-up visits as told by your doctor. This is important.  Get help if:  You got a tetanus shot and you have any of these problems at the injection site: ? Swelling. ? Very bad pain. ? Redness. ? Bleeding.  You have a fever.  A wound that was closed breaks open.  You notice a bad smell coming from your wound or your bandage.  You notice something coming out of the wound, such as wood or glass.  Medicine does not relieve your pain.  You have more redness, swelling, or pain at the site of your wound.  You have fluid, blood, or pus coming from your wound.  You notice a change in the color of your skin near your wound.  You need to change the bandage often because fluid, blood, or pus is coming from the wound.  You start to have a new rash.  You start to have numbness around the wound. Get help right away if:  You have very bad swelling around the wound.  Your pain suddenly gets worse and is very bad.  You notice painful lumps near the wound or anywhere on your body.  You have a red streak going away from your wound.  The wound is on your hand or foot, and: ? You cannot move a finger or toe. ? Your fingers or toes look pale or bluish. Summary  A laceration is a cut that may go through all layers of the skin. The cut may also go into the tissue right under the skin.  Some cuts heal on their own. Others need to be closed with stitches, staples, skin adhesive strips, or skin glue.  Follow your doctor's instructions for caring for your cut. Proper care of a cut lowers the risk of infection, helps the cut heal better, and prevents scarring. This information is not intended to replace advice given to you by your health care provider. Make sure you discuss any questions you have with your health care provider. Document Released: 01/03/2008 Document Revised: 09/14/2017 Document Reviewed: 08/06/2017 Elsevier Patient Education  2020 [employer].

## 2019-04-14 ENCOUNTER — Telehealth: Payer: Self-pay | Admitting: Gastroenterology

## 2019-04-14 NOTE — Telephone Encounter (Signed)
Renee with the [facility] called & was letting us know that the infusion plus is on back order would Dr Marius Ditch like to prescribe something else? Please call [phone].

## 2019-04-16 NOTE — Telephone Encounter (Signed)
See message below and advise.

## 2019-04-16 NOTE — Telephone Encounter (Signed)
He can take over-the-counter oral iron 1 pill daily He is supposed to get labs done that were ordered on 7/20.  Please ask him to stop by the lab  RV

## 2019-04-18 NOTE — Telephone Encounter (Signed)
Pt.notified

## 2019-05-22 ENCOUNTER — Ambulatory Visit: Payer: BC Managed Care – PPO | Admitting: Gastroenterology

## 2019-06-11 DIAGNOSIS — Z20828 Contact with and (suspected) exposure to other viral communicable diseases: Secondary | ICD-10-CM | POA: Diagnosis not present

## 2019-07-22 ENCOUNTER — Other Ambulatory Visit: Payer: Self-pay | Admitting: Nurse Practitioner

## 2019-07-22 MED ORDER — CITALOPRAM HYDROBROMIDE 20 MG PO TABS: 20.0000 mg | ORAL_TABLET | Freq: Every day | ORAL | 0 refills | Status: DC

## 2019-07-22 NOTE — Telephone Encounter (Signed)
Medication Refill - Medication: citalopram (CELEXA) 20 MG tablet  Has the patient contacted their pharmacy? Yes - states he needs a new refill (Agent: If no, request that the patient contact the pharmacy for the refill.) (Agent: If yes, when and what did the pharmacy advise?)  Preferred Pharmacy (with phone number or street name):  [id number] [address], [place] Phone:  [phone]  Fax:  [phone]     Agent: Please be advised that RX refills may take up to 3 business days. We ask that you follow-up with your pharmacy.

## 2019-09-01 ENCOUNTER — Ambulatory Visit (INDEPENDENT_AMBULATORY_CARE_PROVIDER_SITE_OTHER): Payer: BC Managed Care – PPO | Admitting: Nurse Practitioner

## 2019-09-01 ENCOUNTER — Encounter: Payer: Self-pay | Admitting: Nurse Practitioner

## 2019-09-01 ENCOUNTER — Other Ambulatory Visit: Payer: Self-pay

## 2019-09-01 VITALS — BP 105/70 | HR 88 | Temp 98.6°F

## 2019-09-01 DIAGNOSIS — K640 First degree hemorrhoids: Secondary | ICD-10-CM

## 2019-09-01 DIAGNOSIS — F324 Major depressive disorder, single episode, in partial remission: Secondary | ICD-10-CM

## 2019-09-01 DIAGNOSIS — F419 Anxiety disorder, unspecified: Secondary | ICD-10-CM

## 2019-09-01 DIAGNOSIS — D509 Iron deficiency anemia, unspecified: Secondary | ICD-10-CM

## 2019-09-01 MED ORDER — CITALOPRAM HYDROBROMIDE 20 MG PO TABS: 20.0000 mg | ORAL_TABLET | Freq: Every day | ORAL | 3 refills | Status: DC

## 2019-09-01 NOTE — Assessment & Plan Note (Signed)
Secondary to hemorrhoids.  Does not wish to return to GI at this time, refused referral.  Will check levels today and recommend continuing daily iron.  Return for worsening symptoms.

## 2019-09-01 NOTE — Progress Notes (Signed)
BP 105/70   Pulse 88   Temp 98.6 F (37 C) (Oral)   SpO2 98%    Subjective:    Patient ID: David Wong, male    DOB: 08/31/1985, 34 y.o.   MRN: [id number]  HPI: David Wong is a 34 y.o. male  Chief Complaint  Patient presents with  . Anxiety  . Depression   ANXIETY/STRESS Continues on Celexa 20 MG. Duration:stable Anxious mood: occasional  Excessive worrying: no Irritability: no  Sweating: no Nausea: no Palpitations:no Hyperventilation: no Panic attacks: no Agoraphobia: no  Obscessions/compulsions: no Depressed mood: occasional Depression screen [facility] 2/9 09/01/2019 07/18/2018 06/29/2017 07/12/2015 01/08/2015  Decreased Interest 1 0 1 0 1  Down, Depressed, Hopeless 0 1 2 1 2   PHQ - 2 Score 1 1 3 1 3   Altered sleeping 1 1 2  - 1  Tired, decreased energy 0 1 3 - 1  Change in appetite 0 0 1 - 0  Feeling bad or failure about yourself  1 0 1 - 1  Trouble concentrating 0 0 0 - 1  Moving slowly or fidgety/restless 0 0 0 - 0  Suicidal thoughts 0 0 0 - 0  PHQ-9 Score 3 3 10  - 7  Difficult doing work/chores Not difficult at all Not difficult at all - - -   Anhedonia: no Weight changes: no Insomnia: has new baby in house Hypersomnia: no Fatigue/loss of energy: no Feelings of worthlessness: no Feelings of guilt: no Impaired concentration/indecisiveness: no Suicidal ideations: no  Crying spells: no Recent Stressors/Life Changes: yes   Relationship problems: no   Family stress: yes     Financial stress: no    Job stress: no    Recent death/loss: yes GAD 7 : Generalized Anxiety Score 09/01/2019 07/18/2018  Nervous, Anxious, on Edge 0 1  Control/stop worrying 0 1  Worry too much - different things 1 1  Trouble relaxing 0 0  Restless 0 0  Easily annoyed or irritable 1 1  Afraid - awful might happen 0 0  Total GAD 7 Score 2 4  Anxiety Difficulty Not difficult at all Not difficult at all    HEMORRHOIDS: Has history of banding procedure last year, he reports  x 3 times.  EGD and colonoscopy were unremarkable including biopsies for celiac disease and H. pylori infection, performed by Dr. Marius Ditch.  He does endorse continued intermittent rectal bleeding from hemorrhoids, varies scant to moderate amount with wiping.  Denies pain, fatigue, N&V, cold intolerance.  Has history of anemia with rectal bleeding and continues on daily iron.  Denies constipation, has BM every day without straining.  Is taking a daily fiber chew.  Is using steroid cream at home on occasion, which seems to help.  He does not want to return to Gi at this time due to discomfort from previous procedures.    Relevant past medical, surgical, family and social history reviewed and updated as indicated. Interim medical history since our last visit reviewed. Allergies and medications reviewed and updated.  Review of Systems  Constitutional: Negative for activity change, diaphoresis, fatigue and fever.  Respiratory: Negative for cough, chest tightness, shortness of breath and wheezing.   Cardiovascular: Negative for chest pain, palpitations and leg swelling.  Gastrointestinal: Positive for anal bleeding (occasional from hemorrhoids).  Endocrine: Negative for cold intolerance, heat intolerance, polydipsia, polyphagia and polyuria.  Neurological: Negative.   Psychiatric/Behavioral: Negative.     Per HPI unless specifically indicated above     Objective:  BP 105/70   Pulse 88   Temp 98.6 F (37 C) (Oral)   SpO2 98%   Wt Readings from Last 3 Encounters:  03/10/19 225 lb (102.1 kg)  02/17/19 228 lb 12.8 oz (103.8 kg)  01/20/19 226 lb 12.8 oz (102.9 kg)    Physical Exam Vitals and nursing note reviewed.  Constitutional:      General: He is awake. He is not in acute distress.    Appearance: He is well-developed and overweight. He is not ill-appearing.  HENT:     Head: Normocephalic and atraumatic.     Right Ear: Hearing normal. No drainage.     Left Ear: Hearing normal. No  drainage.  Eyes:     General: Lids are normal.        Right eye: No discharge.        Left eye: No discharge.     Conjunctiva/sclera: Conjunctivae normal.     Pupils: Pupils are equal, round, and reactive to light.  Neck:     Thyroid: No thyromegaly.     Vascular: No carotid bruit.  Cardiovascular:     Rate and Rhythm: Normal rate and regular rhythm.     Heart sounds: Normal heart sounds, S1 normal and S2 normal. No murmur. No gallop.   Pulmonary:     Effort: Pulmonary effort is normal. No accessory muscle usage or respiratory distress.     Breath sounds: Normal breath sounds.  Abdominal:     General: Bowel sounds are normal.     Palpations: Abdomen is soft.     Tenderness: There is no abdominal tenderness.     Comments: Rectal exam deferred per patient request  Musculoskeletal:        General: Normal range of motion.     Cervical back: Normal range of motion and neck supple.     Right lower leg: No edema.     Left lower leg: No edema.  Skin:    General: Skin is warm and dry.  Neurological:     Mental Status: He is alert and oriented to person, place, and time.  Psychiatric:        Mood and Affect: Mood normal.        Behavior: Behavior normal. Behavior is cooperative.        Thought Content: Thought content normal.        Judgment: Judgment normal.     Results for orders placed or performed during the hospital encounter of 01/23/19  Ferritin  Result Value Ref Range   Ferritin 8 (L) 24 - 336 ng/mL  Iron and TIBC  Result Value Ref Range   Iron 34 (L) 45 - 182 ug/dL   TIBC 450 250 - 450 ug/dL   Saturation Ratios 8 (L) 17.9 - 39.5 %   UIBC 416 ug/dL  CBC  Result Value Ref Range   WBC 9.7 4.0 - 10.5 K/uL   RBC 4.65 4.22 - 5.81 MIL/uL   Hemoglobin 10.6 (L) 13.0 - 17.0 g/dL   HCT 36.1 (L) 39.0 - 52.0 %   MCV 77.6 (L) 80.0 - 100.0 fL   MCH 22.8 (L) 26.0 - 34.0 pg   MCHC 29.4 (L) 30.0 - 36.0 g/dL   RDW 16.9 (H) 11.5 - 15.5 %   Platelets 281 150 - 400 K/uL   nRBC 0.0  0.0 - 0.2 %      Assessment & Plan:   Problem List Items Addressed This Visit      Cardiovascular  and Mediastinum   Hemorrhoids    Chronic, ongoing with occasional rectal bleeding.  Does not want to return to GI at this time.  Had banding last year.  Continue focus on stool softeners at home, recommend daily Miralax or Colace, along with his fiber supplement.  Obtain CBC and iron check today.  Return for worsening or continued issues.        Other   Anxiety    Chronic, ongoing.  Denies SI/HI.  Continue current medication regimen and adjust as needed.  Refills sent.      Relevant Medications   citalopram (CELEXA) 20 MG tablet   Depression, major, single episode, in partial remission (HCC) - Primary    Chronic, stable.  Denies SI/HI.  Continue current dose of Celexa and adjust as needed.   Refills provided.  Return in 6 months.      Relevant Medications   citalopram (CELEXA) 20 MG tablet   Iron deficiency anemia    Secondary to hemorrhoids.  Does not wish to return to GI at this time, refused referral.  Will check levels today and recommend continuing daily iron.  Return for worsening symptoms.      Relevant Orders   CBC with Differential/Platelet   Iron, TIBC and Ferritin Panel       Follow up plan: Return in about 6 months (around 02/29/2020) for Mood .

## 2019-09-01 NOTE — Assessment & Plan Note (Signed)
Chronic, ongoing.  Denies SI/HI.  Continue current medication regimen and adjust as needed.  Refills sent.

## 2019-09-01 NOTE — Assessment & Plan Note (Signed)
Chronic, stable.  Denies SI/HI.  Continue current dose of Celexa and adjust as needed.   Refills provided.  Return in 6 months.

## 2019-09-01 NOTE — Patient Instructions (Signed)

## 2019-09-01 NOTE — Assessment & Plan Note (Signed)
Chronic, ongoing with occasional rectal bleeding.  Does not want to return to GI at this time.  Had banding last year.  Continue focus on stool softeners at home, recommend daily Miralax or Colace, along with his fiber supplement.  Obtain CBC and iron check today.  Return for worsening or continued issues.

## 2019-09-02 LAB — IRON,TIBC AND FERRITIN PANEL
Ferritin: 5 ng/mL — ABNORMAL LOW (ref 30–400)
Iron Saturation: 5 % — CL (ref 15–55)
Iron: 22 ug/dL — ABNORMAL LOW (ref 38–169)
Total Iron Binding Capacity: 438 ug/dL (ref 250–450)
UIBC: 416 ug/dL — ABNORMAL HIGH (ref 111–343)

## 2019-09-02 LAB — CBC WITH DIFFERENTIAL/PLATELET
Basophils Absolute: 0.1 10*3/uL (ref 0.0–0.2)
Basos: 1 %
EOS (ABSOLUTE): 0.3 10*3/uL (ref 0.0–0.4)
Eos: 4 %
Hematocrit: 35.3 % — ABNORMAL LOW (ref 37.5–51.0)
Hemoglobin: 10.9 g/dL — ABNORMAL LOW (ref 13.0–17.7)
Immature Grans (Abs): 0 10*3/uL (ref 0.0–0.1)
Immature Granulocytes: 0 %
Lymphocytes Absolute: 2.8 10*3/uL (ref 0.7–3.1)
Lymphs: 36 %
MCH: 22.4 pg — ABNORMAL LOW (ref 26.6–33.0)
MCHC: 30.9 g/dL — ABNORMAL LOW (ref 31.5–35.7)
MCV: 73 fL — ABNORMAL LOW (ref 79–97)
Monocytes Absolute: 0.6 10*3/uL (ref 0.1–0.9)
Monocytes: 8 %
Neutrophils Absolute: 3.9 10*3/uL (ref 1.4–7.0)
Neutrophils: 51 %
Platelets: 263 10*3/uL (ref 150–450)
RBC: 4.86 x10E6/uL (ref 4.14–5.80)
RDW: 15.3 % (ref 11.6–15.4)
WBC: 7.8 10*3/uL (ref 3.4–10.8)

## 2019-09-02 NOTE — Progress Notes (Signed)
Spoke to patient on phone, continues to show anemia on labs with low iron.  He continues to refuse to return to GI, which would benefit him.  At this time he wishes to continue his iron daily and will notify provider if any symptoms present, such as SOB, CP, palpitations, cold intolerance, fatigue.  If symptoms present will get into GI immediately.  He is worried about getting more banding as first procedure caused him discomfort. Alerted him to take iron with orange juice for better absorption.

## 2019-09-09 DIAGNOSIS — J029 Acute pharyngitis, unspecified: Secondary | ICD-10-CM | POA: Diagnosis not present

## 2019-09-09 DIAGNOSIS — Z1152 Encounter for screening for COVID-19: Secondary | ICD-10-CM | POA: Diagnosis not present

## 2019-09-09 DIAGNOSIS — R509 Fever, unspecified: Secondary | ICD-10-CM | POA: Diagnosis not present

## 2019-09-09 DIAGNOSIS — R05 Cough: Secondary | ICD-10-CM | POA: Diagnosis not present

## 2019-10-02 DIAGNOSIS — Z20828 Contact with and (suspected) exposure to other viral communicable diseases: Secondary | ICD-10-CM | POA: Diagnosis not present

## 2019-10-10 ENCOUNTER — Encounter: Payer: Self-pay | Admitting: Nurse Practitioner

## 2019-10-23 ENCOUNTER — Ambulatory Visit: Payer: Self-pay | Admitting: *Deleted

## 2019-10-23 ENCOUNTER — Other Ambulatory Visit: Payer: Self-pay

## 2019-10-23 ENCOUNTER — Encounter (HOSPITAL_COMMUNITY): Payer: Self-pay | Admitting: Emergency Medicine

## 2019-10-23 ENCOUNTER — Emergency Department (HOSPITAL_COMMUNITY)
Admission: EM | Admit: 2019-10-23 | Discharge: 2019-10-24 | Disposition: A | Discharge status: Home / Self Care | Payer: BC Managed Care – PPO | Attending: Emergency Medicine | Admitting: Emergency Medicine

## 2019-10-23 DIAGNOSIS — K625 Hemorrhage of anus and rectum: Secondary | ICD-10-CM | POA: Diagnosis not present

## 2019-10-23 DIAGNOSIS — Z79899 Other long term (current) drug therapy: Secondary | ICD-10-CM | POA: Diagnosis not present

## 2019-10-23 DIAGNOSIS — K648 Other hemorrhoids: Secondary | ICD-10-CM | POA: Diagnosis not present

## 2019-10-23 LAB — TYPE AND SCREEN
ABO/RH(D): O POS
Antibody Screen: NEGATIVE

## 2019-10-23 LAB — COMPREHENSIVE METABOLIC PANEL
ALT: 40 U/L (ref 0–44)
AST: 26 U/L (ref 15–41)
Albumin: 3.6 g/dL (ref 3.5–5.0)
Alkaline Phosphatase: 67 U/L (ref 38–126)
Anion gap: 8 (ref 5–15)
BUN: 10 mg/dL (ref 6–20)
CO2: 23 mmol/L (ref 22–32)
Calcium: 9.4 mg/dL (ref 8.9–10.3)
Chloride: 110 mmol/L (ref 98–111)
Creatinine, Ser: 1.13 mg/dL (ref 0.61–1.24)
GFR calc Af Amer: >60 mL/min (ref >60)
GFR calc non Af Amer: >60 mL/min (ref >60)
Glucose, Bld: 88 mg/dL (ref 70–99)
Potassium: 3.6 mmol/L (ref 3.5–5.1)
Sodium: 141 mmol/L (ref 135–145)
Total Bilirubin: 0.5 mg/dL (ref 0.3–1.2)
Total Protein: 7.3 g/dL (ref 6.5–8.1)

## 2019-10-23 LAB — ABO/RH: ABO/RH(D): O POS

## 2019-10-23 LAB — CBC
HCT: 36.1 % — ABNORMAL LOW (ref 39.0–52.0)
Hemoglobin: 10.3 g/dL — ABNORMAL LOW (ref 13.0–17.0)
MCH: 21.9 pg — ABNORMAL LOW (ref 26.0–34.0)
MCHC: 28.5 g/dL — ABNORMAL LOW (ref 30.0–36.0)
MCV: 76.6 fL — ABNORMAL LOW (ref 80.0–100.0)
Platelets: 344 10*3/uL (ref 150–400)
RBC: 4.71 MIL/uL (ref 4.22–5.81)
RDW: 16.7 % — ABNORMAL HIGH (ref 11.5–15.5)
WBC: 7.7 10*3/uL (ref 4.0–10.5)
nRBC: 0 % (ref 0.0–0.2)

## 2019-10-23 LAB — POC OCCULT BLOOD, ED: Fecal Occult Bld: POSITIVE — AB

## 2019-10-23 MED ORDER — HYDROCORTISONE ACETATE 25 MG RE SUPP: 25.0000 mg | Freq: Two times a day (BID) | RECTAL | 0 refills | Status: DC

## 2019-10-23 NOTE — ED Notes (Signed)
Patient given discharge instructions patient verbalizes understanding. 

## 2019-10-23 NOTE — ED Triage Notes (Addendum)
Pt reports that he has a history of hemorrhoids, states that he has been having a lot of issues with rectal bleeding that his doctor said was from his hemorrhoids, however, he reports that blood ranges from brb to black in color. States he had banding done in august. Called his pcp today due to not feeling well and states they advised that he come here r/t concern for blood loss. He states that he bleeds so heavily at times the toilet bowl is filled with blood. He denies any abdominal pain.

## 2019-10-23 NOTE — Telephone Encounter (Signed)
Agree with this plan, he had refused return to GI last visit, although recommended.  If worsening he needs ER visit today.

## 2019-10-23 NOTE — Telephone Encounter (Signed)
Patient states he has been bleeding from rectum for some time now- he had banding of hemorrhoids at GI but things have not improved. Patient states he is seeing blood without stool. Advised he can call GI- but per protocol this is ED visit and advised him to go to ED.  Reason for Disposition . [1] MODERATE rectal bleeding (small blood clots, passing blood without stool, or toilet water turns red) AND [2] more than once a day  Answer Assessment - Initial Assessment Questions 1. APPEARANCE of BLOOD: "What color is it?" "Is it passed separately, on the surface of the stool, or mixed in with the stool?"      All- bright/dark, seperately 2. AMOUNT: "How much blood was passed?"      Filling toilet up- red blood 3. FREQUENCY: "How many times has blood been passed with the stools?"      For a while- has not stopped 4. ONSET: "When was the blood first seen in the stools?" (Days or weeks)      For some time 5. DIARRHEA: "Is there also some diarrhea?" If so, ask: "How many diarrhea stools were passed in past 24 hours?"      Normal stools 6. CONSTIPATION: "Do you have constipation?" If so, "How bad is it?"     no 7. RECURRENT SYMPTOMS: "Have you had blood in your stools before?" If so, ask: "When was the last time?" and "What happened that time?"      Yes- GI they did banding of Hemorrhoids and it seems to have made it worse 8. BLOOD THINNERS: "Do you take any blood thinners?" (e.g., Coumadin/warfarin, Pradaxa/dabigatran, aspirin)     no 9. OTHER SYMPTOMS: "Do you have any other symptoms?"  (e.g., abdominal pain, vomiting, dizziness, fever)     Past week- fever comes and goes 10. PREGNANCY: "Is there any chance you are pregnant?" "When was your last menstrual period?"       n/a  Protocols used: RECTAL BLEEDING-A-AH

## 2019-10-23 NOTE — ED Provider Notes (Signed)
[facility] EM[facility]ENCY DEPARTMENT Provider Note   CSN: [id number] Arrival date & time: 10/23/19  1001     History Chief Complaint  Patient presents with  . Rectal Bleeding    David Wong is a 34 y.o. male.  Pt presents to the ED today with rectal bleeding.  Pt has a hx of rectal bleeding from internal hemorrhoids.  He had a banding procedure by GI in July of 2020.  Pt said he does not feel like that helped.  The pt said it felt worse.  Pt has not seen surgery. The pt said he feels like he's had more bleeding that nl and wanted to come in to make sure his hemoglobin was not too low.        Past Medical History:  Diagnosis Date  . Anemia   . Anxiety   . Depression     Patient Active Problem List   Diagnosis Date Noted  . Hemorrhoids   . Iron deficiency anemia 09/04/2017  . Depression, major, single episode, in partial remission ([place]) 06/29/2017  . Sleep apnea 06/29/2017  . Anxiety 01/08/2015  . Insomnia 01/08/2015    Past Surgical History:  Procedure Laterality Date  . COLONOSCOPY WITH PROPOFOL N/A 10/08/2018   Procedure: COLONOSCOPY WITH PROPOFOL;  Surgeon: Lin Landsman, MD;  Location: [facility] ENDOSCOPY;  Service: Gastroenterology;  Laterality: N/A;  . ESOPHAGOGASTRODUODENOSCOPY (EGD) WITH PROPOFOL N/A 10/08/2018   Procedure: ESOPHAGOGASTRODUODENOSCOPY (EGD) WITH PROPOFOL;  Surgeon: Lin Landsman, MD;  Location: [facility] ENDOSCOPY;  Service: Gastroenterology;  Laterality: N/A;  . right pinky finger  2013   pins inserted       Family History  Problem Relation Age of Onset  . Hyperlipidemia Mother   . Hypertension Mother   . Hyperlipidemia Father   . Diabetes Father   . Hypertension Brother   . Arthritis Maternal Grandfather        RA  . Heart disease Paternal Grandfather     Social History   Tobacco Use  . Smoking status: Never Smoker  . Smokeless tobacco: Never Used  Substance Use Topics  . Alcohol use: Yes   Alcohol/week: 0.0 standard drinks    Comment: Rarely  . Drug use: No    Home Medications Prior to Admission medications   Medication Sig Start Date End Date Taking? Authorizing Provider  citalopram (CELEXA) 20 MG tablet Take 1 tablet (20 mg total) by mouth daily. 09/01/19  Yes Cannady, Jolene T, NP  Ferrous Sulfate (IRON PO) Take by mouth daily.   Yes [provider]  guaiFENesin (MUCINEX) 600 MG 12 hr tablet Take 600 mg by mouth 2 (two) times daily as needed for cough.   Yes [provider]  ibuprofen (ADVIL) 200 MG tablet Take 400 mg by mouth every 6 (six) hours as needed for moderate pain.   Yes [provider]  hydrocortisone (ANUSOL-HC) 25 MG suppository Place 1 suppository (25 mg total) rectally 2 (two) times daily. 10/23/19   Isla Pence, MD    Allergies    Patient has no known allergies.  Review of Systems   Review of Systems  Gastrointestinal: Positive for anal bleeding and rectal pain.  All other systems reviewed and are negative.   Physical Exam Updated Vital Signs BP 127/77 (BP Location: Left Arm)   Pulse 87   Temp 98.4 F (36.9 C) (Oral)   Resp 16   Ht 5\' 10"  (1.778 m)   Wt 103.4 kg   SpO2 99%  BMI 32.71 kg/m   Physical Exam Vitals and nursing note reviewed. Exam conducted with a chaperone present.  Constitutional:      Appearance: Normal appearance.  HENT:     Head: Normocephalic and atraumatic.     Right Ear: External ear normal.     Left Ear: External ear normal.     Nose: Nose normal.     Mouth/Throat:     Mouth: Mucous membranes are moist.     Pharynx: Oropharynx is clear.  Eyes:     Extraocular Movements: Extraocular movements intact.     Conjunctiva/sclera: Conjunctivae normal.     Pupils: Pupils are equal, round, and reactive to light.  Cardiovascular:     Rate and Rhythm: Normal rate and regular rhythm.     Pulses: Normal pulses.     Heart sounds: Normal heart sounds.  Pulmonary:     Effort: Pulmonary effort  is normal.     Breath sounds: Normal breath sounds.  Abdominal:     General: Abdomen is flat. Bowel sounds are normal.     Palpations: Abdomen is soft.  Genitourinary:    Rectum: Guaiac result positive. Tenderness, anal fissure and internal hemorrhoid present.     Comments: ? Fissure?   Musculoskeletal:        General: Normal range of motion.     Cervical back: Normal range of motion and neck supple.  Skin:    General: Skin is warm.     Capillary Refill: Capillary refill takes less than 2 seconds.  Neurological:     General: No focal deficit present.     Mental Status: He is alert and oriented to person, place, and time.  Psychiatric:        Mood and Affect: Mood normal.        Behavior: Behavior normal.     ED Results / Procedures / Treatments   Labs (all labs ordered are listed, but only abnormal results are displayed) Labs Reviewed  CBC - Abnormal; Notable for the following components:      Result Value   Hemoglobin 10.3 (*)    HCT 36.1 (*)    MCV 76.6 (*)    MCH 21.9 (*)    MCHC 28.5 (*)    RDW 16.7 (*)    All other components within normal limits  POC OCCULT BLOOD, ED - Abnormal; Notable for the following components:   Fecal Occult Bld POSITIVE (*)    All other components within normal limits  COMPREHENSIVE METABOLIC PANEL  TYPE AND SCREEN  ABO/RH    EKG None  Radiology No results found.  Procedures Procedures (including critical care time)  Medications Ordered in ED Medications - No data to display  ED Course  I have reviewed the triage vital signs and the nursing notes.  Pertinent labs & imaging results that were available during my care of the patient were reviewed by me and considered in my medical decision making (see chart for details).    MDM Rules/Calculators/A&P                     Hemoglobin is stable.  Pt has no medications for his hemorrhoids.  He is told to f/u with surgery.  Continue iron.  Continue a high fiber diet.  Return if  worse.   Final Clinical Impression(s) / ED Diagnoses Final diagnoses:  Internal hemorrhoids  Rectal bleeding    Rx / DC Orders ED Discharge Orders  Ordered    hydrocortisone (ANUSOL-HC) 25 MG suppository  2 times daily     10/23/19 1417           Isla Pence, MD 10/23/19 1422

## 2019-10-24 NOTE — Telephone Encounter (Signed)
Pt went to [facility] ED.

## 2019-10-30 ENCOUNTER — Encounter: Payer: Self-pay | Admitting: Nurse Practitioner

## 2019-10-30 ENCOUNTER — Telehealth: Payer: Self-pay | Admitting: Nurse Practitioner

## 2019-10-30 ENCOUNTER — Telehealth (INDEPENDENT_AMBULATORY_CARE_PROVIDER_SITE_OTHER): Payer: BC Managed Care – PPO | Admitting: Nurse Practitioner

## 2019-10-30 ENCOUNTER — Inpatient Hospital Stay: Payer: BC Managed Care – PPO | Admitting: Nurse Practitioner

## 2019-10-30 DIAGNOSIS — D500 Iron deficiency anemia secondary to blood loss (chronic): Secondary | ICD-10-CM

## 2019-10-30 DIAGNOSIS — K640 First degree hemorrhoids: Secondary | ICD-10-CM | POA: Diagnosis not present

## 2019-10-30 NOTE — Patient Instructions (Signed)
Anemia  Anemia is a condition in which you do not have enough red blood cells or hemoglobin. Hemoglobin is a substance in red blood cells that carries oxygen. When you do not have enough red blood cells or hemoglobin (are anemic), your body cannot get enough oxygen and your organs may not work properly. As a result, you may feel very tired or have other problems. What are the causes? Common causes of anemia include:  Excessive bleeding. Anemia can be caused by excessive bleeding inside or outside the body, including bleeding from the intestine or from periods in women.  Poor nutrition.  Long-lasting (chronic) kidney, thyroid, and liver disease.  Bone marrow disorders.  Cancer and treatments for cancer.  HIV (human immunodeficiency virus) and AIDS (acquired immunodeficiency syndrome).  Treatments for HIV and AIDS.  Spleen problems.  Blood disorders.  Infections, medicines, and autoimmune disorders that destroy red blood cells. What are the signs or symptoms? Symptoms of this condition include:  Minor weakness.  Dizziness.  Headache.  Feeling heartbeats that are irregular or faster than normal (palpitations).  Shortness of breath, especially with exercise.  Paleness.  Cold sensitivity.  Indigestion.  Nausea.  Difficulty sleeping.  Difficulty concentrating. Symptoms may occur suddenly or develop slowly. If your anemia is mild, you may not have symptoms. How is this diagnosed? This condition is diagnosed based on:  Blood tests.  Your medical history.  A physical exam.  Bone marrow biopsy. Your health care provider may also check your stool (feces) for blood and may do additional testing to look for the cause of your bleeding. You may also have other tests, including:  Imaging tests, such as a CT scan or MRI.  Endoscopy.  Colonoscopy. How is this treated? Treatment for this condition depends on the cause. If you continue to lose a lot of blood, you may  need to be treated at a hospital. Treatment may include:  Taking supplements of iron, vitamin S31, or folic acid.  Taking a hormone medicine (erythropoietin) that can help to stimulate red blood cell growth.  Having a blood transfusion. This may be needed if you lose a lot of blood.  Making changes to your diet.  Having surgery to remove your spleen. Follow these instructions at home:  Take over-the-counter and prescription medicines only as told by your health care provider.  Take supplements only as told by your health care provider.  Follow any diet instructions that you were given.  Keep all follow-up visits as told by your health care provider. This is important. Contact a health care provider if:  You develop new bleeding anywhere in the body. Get help right away if:  You are very weak.  You are short of breath.  You have pain in your abdomen or chest.  You are dizzy or feel faint.  You have trouble concentrating.  You have bloody or black, tarry stools.  You vomit repeatedly or you vomit up blood. Summary  Anemia is a condition in which you do not have enough red blood cells or enough of a substance in your red blood cells that carries oxygen (hemoglobin).  Symptoms may occur suddenly or develop slowly.  If your anemia is mild, you may not have symptoms.  This condition is diagnosed with blood tests as well as a medical history and physical exam. Other tests may be needed.  Treatment for this condition depends on the cause of the anemia. This information is not intended to replace advice given to you by  your health care provider. Make sure you discuss any questions you have with your health care provider. Document Revised: 06/29/2017 Document Reviewed: 08/18/2016 Elsevier Patient Education  [place].

## 2019-10-30 NOTE — Progress Notes (Signed)
There were no vitals taken for this visit.   Subjective:    Patient ID: David Wong, male    DOB: May 04, 1986, 34 y.o.   MRN: [id number]  HPI: David Wong is a 34 y.o. male  Chief Complaint  Patient presents with  . ER Follow Up    . This visit was completed via MyChart due to the restrictions of the COVID-19 pandemic. All issues as above were discussed and addressed. Physical exam was done as above through visual confirmation on MyChart. If it was felt that the patient should be evaluated in the office, they were directed there. The patient verbally consented to this visit. . Location of the patient: home . Location of the provider: home . Those involved with this call:  . Provider: Marnee Guarneri, DNP . CMA: Yvonna Alanis, CMA . Front Desk/Registration: Don Perking  . Time spent on call: 15 minutes with patient face to face via video conference. More than 50% of this time was spent in counseling and coordination of care. 10 minutes total spent in review of patient's record and preparation of their chart.  . I verified patient identity using two factors (patient name and date of birth). Patient consents verbally to being seen via telemedicine visit today.    ER FOLLOW UP Was seen in ER on 10/23/2019 for hemorrhoids and rectal bleeding.  He sees general surgery on the 16th.  Has been followed for this for some time, has banding done by GI 10/08/2018. He reports this as a very uncomfortable procedure.  At last visit in office 09/02/2019 he refused to return to GI for follow-up.  At this visit his H/H 10.9/35.3, MCV 73, iron 22, iron sat 5, ferritin 5.  On recent ER visit his H/H 10.3/36.1, MCV 76.6.  No iron level obtained, but stool was positive for blood.  Kidney and liver function remained stable on ER visit.  On review has had ongoing issues with anemia and hemorrhoids since February 2019.    He continues to bleed, notices it after having bowel movement, "a pretty good  stream" in toilet.  Denies any worsening of bleeding or changes in amount/consistency.  Reports occasional headache and constant fatigue, feels weak in legs often.  Continues to take iron supplement once a day.  He denies abdominal pain, constipation, diarrhea, fever, abd distension, or N&V.  States his appetite remains good and no loss of weight.   Time since discharge: 10/23/2019 Hospital/facility: [facility] Diagnosis: Internal hemorrhoids & rectal bleeding Procedures/tests: Labs performed and noted Consultants: none -- referral to general surgery placed New medications: none Discharge instructions:  To see general surgery and follow-up with PCP Status: stable   Relevant past medical, surgical, family and social history reviewed and updated as indicated. Interim medical history since our last visit reviewed. Allergies and medications reviewed and updated.  Review of Systems  Constitutional: Positive for fatigue and fever (one week ago, not current). Negative for activity change, appetite change, diaphoresis and unexpected weight change.  Respiratory: Negative.   Cardiovascular: Negative.   Gastrointestinal: Positive for blood in stool and rectal pain. Negative for abdominal distention, abdominal pain, constipation, diarrhea, nausea and vomiting.    Per HPI unless specifically indicated above     Objective:    There were no vitals taken for this visit.  Wt Readings from Last 3 Encounters:  10/23/19 228 lb (103.4 kg)  03/10/19 225 lb (102.1 kg)  02/17/19 228 lb 12.8 oz (103.8 kg)    Physical  Exam Vitals and nursing note reviewed.  Constitutional:      General: He is awake. He is not in acute distress.    Appearance: He is well-developed. He is not ill-appearing.  HENT:     Head: Normocephalic.     Right Ear: Hearing normal. No drainage.     Left Ear: Hearing normal. No drainage.  Eyes:     General: Lids are normal.        Right eye: No discharge.        Left eye: No discharge.      Conjunctiva/sclera: Conjunctivae normal.  Pulmonary:     Effort: Pulmonary effort is normal. No accessory muscle usage or respiratory distress.  Musculoskeletal:     Cervical back: Normal range of motion.  Neurological:     Mental Status: He is alert and oriented to person, place, and time.  Psychiatric:        Mood and Affect: Mood normal.        Behavior: Behavior normal. Behavior is cooperative.        Thought Content: Thought content normal.        Judgment: Judgment normal.     Results for orders placed or performed during the hospital encounter of 10/23/19  Comprehensive metabolic panel  Result Value Ref Range   Sodium 141 135 - 145 mmol/L   Potassium 3.6 3.5 - 5.1 mmol/L   Chloride 110 98 - 111 mmol/L   CO2 23 22 - 32 mmol/L   Glucose, Bld 88 70 - 99 mg/dL   BUN 10 6 - 20 mg/dL   Creatinine, Ser 1.13 0.61 - 1.24 mg/dL   Calcium 9.4 8.9 - 10.3 mg/dL   Total Protein 7.3 6.5 - 8.1 g/dL   Albumin 3.6 3.5 - 5.0 g/dL   AST 26 15 - 41 U/L   ALT 40 0 - 44 U/L   Alkaline Phosphatase 67 38 - 126 U/L   Total Bilirubin 0.5 0.3 - 1.2 mg/dL   GFR calc non Af Amer >60 >60 mL/min   GFR calc Af Amer >60 >60 mL/min   Anion gap 8 5 - 15  CBC  Result Value Ref Range   WBC 7.7 4.0 - 10.5 K/uL   RBC 4.71 4.22 - 5.81 MIL/uL   Hemoglobin 10.3 (L) 13.0 - 17.0 g/dL   HCT 36.1 (L) 39.0 - 52.0 %   MCV 76.6 (L) 80.0 - 100.0 fL   MCH 21.9 (L) 26.0 - 34.0 pg   MCHC 28.5 (L) 30.0 - 36.0 g/dL   RDW 16.7 (H) 11.5 - 15.5 %   Platelets 344 150 - 400 K/uL   nRBC 0.0 0.0 - 0.2 %  POC occult blood, ED  Result Value Ref Range   Fecal Occult Bld POSITIVE (A) NEGATIVE  Type and screen [place]  Result Value Ref Range   ABO/RH(D) O POS    Antibody Screen NEG    Sample Expiration      10/26/2019,2359 Performed at [facility], [address] [place], [place] [address]   ABO/Rh  Result Value Ref Range   ABO/RH(D)      O POS Performed at [facility] [address], [place], [place] [address]       Assessment & Plan:   Problem List Items Addressed This Visit      Cardiovascular and Mediastinum   Hemorrhoids - Primary    Ongoing issue, scheduled to see general surgery on  the 16th and recommended he maintain this appointment.  He has fears due to discomfort with last procedure and discussed with him today.  He plans on keeping general surgery appointment.  Continue supplements for anemia.  Educated on reasons to immediately return to ER, including increased fatigue, bleeding, weakness, fever, palpitations, SOB.      Relevant Orders   CBC with Differential/Platelet   Basic metabolic panel   Iron, TIBC and Ferritin Panel     Other   Iron deficiency anemia    Ongoing with internal hemorrhoids.  Is scheduled to see general surgery on the 16th.  CBC in ER mild decrease in levels from previous a month prior, but remaining in 10 range.  Will bring in next week to recheck these levels: CBC and iron levels.  Recommend he take Slow Fe twice a day and take with orange juice or Vitamin C tablet to enhance absorption.  He was able to verbalize this back.  Educated on reasons to immediately return to ER, including increased fatigue, bleeding, weakness, fever, palpitations, SOB.  Will follow-up with him after general surgery visit.      Relevant Orders   CBC with Differential/Platelet   Basic metabolic panel   Iron, TIBC and Ferritin Panel      I discussed the assessment and treatment plan with the patient. The patient was provided an opportunity to ask questions and all were answered. The patient agreed with the plan and demonstrated an understanding of the instructions.   The patient was advised to call back or seek an in-person evaluation if the symptoms worsen or if the condition fails to improve as anticipated.   I provided 15+ minutes of time during this encounter.  Follow up plan: Return in about 4 weeks (around 11/27/2019) for Anemia and  hemorrhoids.

## 2019-10-30 NOTE — Assessment & Plan Note (Signed)
Ongoing issue, scheduled to see general surgery on the 16th and recommended he maintain this appointment.  He has fears due to discomfort with last procedure and discussed with him today.  He plans on keeping general surgery appointment.  Continue supplements for anemia.  Educated on reasons to immediately return to ER, including increased fatigue, bleeding, weakness, fever, palpitations, SOB.

## 2019-10-30 NOTE — Assessment & Plan Note (Signed)
Ongoing with internal hemorrhoids.  Is scheduled to see general surgery on the 16th.  CBC in ER mild decrease in levels from previous a month prior, but remaining in 10 range.  Will bring in next week to recheck these levels: CBC and iron levels.  Recommend he take Slow Fe twice a day and take with orange juice or Vitamin C tablet to enhance absorption.  He was able to verbalize this back.  Educated on reasons to immediately return to ER, including increased fatigue, bleeding, weakness, fever, palpitations, SOB.  Will follow-up with him after general surgery visit.

## 2019-10-30 NOTE — Telephone Encounter (Signed)
Copied from [place] [id number]. Topic: Appointment Scheduling - Scheduling Inquiry for Clinic >> Oct 30, 2019  3:10 PM Erick Blinks wrote: Reason for CRM: Pt called reporting that his condition is worsening and would like to be scheduled for a surgeon as soon as possible. Please advise  Best contact: [phone]

## 2019-10-31 ENCOUNTER — Other Ambulatory Visit: Payer: Self-pay | Admitting: Nurse Practitioner

## 2019-10-31 DIAGNOSIS — K640 First degree hemorrhoids: Secondary | ICD-10-CM

## 2019-10-31 DIAGNOSIS — D500 Iron deficiency anemia secondary to blood loss (chronic): Secondary | ICD-10-CM

## 2019-10-31 NOTE — Telephone Encounter (Signed)
Patient notified and verbalized understanding. 

## 2019-10-31 NOTE — Telephone Encounter (Signed)
Please let David Wong know I will put in urgent general surgery referral today to see if we can get him sooner, but if worsening I recommend he head to ER immediately if increased bleeding or worsening symptoms (such as shortness of breath, fatigue, dizziness).

## 2019-11-03 DIAGNOSIS — D649 Anemia, unspecified: Secondary | ICD-10-CM | POA: Diagnosis not present

## 2019-11-03 DIAGNOSIS — K6289 Other specified diseases of anus and rectum: Secondary | ICD-10-CM | POA: Diagnosis not present

## 2019-11-03 DIAGNOSIS — K649 Unspecified hemorrhoids: Secondary | ICD-10-CM | POA: Diagnosis not present

## 2019-11-04 ENCOUNTER — Ambulatory Visit: Payer: Self-pay | Admitting: Surgery

## 2019-11-04 ENCOUNTER — Ambulatory Visit (INDEPENDENT_AMBULATORY_CARE_PROVIDER_SITE_OTHER): Payer: BC Managed Care – PPO | Admitting: Surgery

## 2019-11-04 ENCOUNTER — Other Ambulatory Visit: Payer: Self-pay

## 2019-11-04 ENCOUNTER — Encounter: Payer: Self-pay | Admitting: Surgery

## 2019-11-04 VITALS — BP 121/80 | HR 82 | Temp 94.6°F | Ht 71.0 in | Wt 225.4 lb

## 2019-11-04 DIAGNOSIS — K645 Perianal venous thrombosis: Secondary | ICD-10-CM | POA: Diagnosis not present

## 2019-11-04 NOTE — H&P (View-Only) (Signed)
Patient ID: [id number] David Wong, male   DOB: [birth year], 34 y.o.   MRN: [id number]  Chief Complaint: Bleeding hemorrhoids  History of Present Illness David Wong is a 34 y.o. male with hemorrhoidal issues going on for over 2 years, progressively worsening.  Previously seen and evaluated, treated with evaluation by colonoscopy and subsequent banding procedures.  He underwent a total of 3/6 months.  Currently has clotting blood past with bowel movements.  A trial of Anusol suppositories has not been helpful.  Still passing mixtures of bright and old blood.  Was seen end of March for rectal bleeding and abdominal pain.  Had such bleeding at 1 point in time that he had a running down his leg.  He reports utilization of fiber supplement, may not remain or maintain adequate hydration.  He reports twice daily bowel movements with minimal effort however recently due to the pain has become more difficult to pass his movements.  Past Medical History Past Medical History:  Diagnosis Date  . Anemia   . Anxiety   . Depression       Past Surgical History:  Procedure Laterality Date  . COLONOSCOPY WITH PROPOFOL N/A 10/08/2018   Procedure: COLONOSCOPY WITH PROPOFOL;  Surgeon: Lin Landsman, MD;  Location: [facility] ENDOSCOPY;  Service: Gastroenterology;  Laterality: N/A;  . ESOPHAGOGASTRODUODENOSCOPY (EGD) WITH PROPOFOL N/A 10/08/2018   Procedure: ESOPHAGOGASTRODUODENOSCOPY (EGD) WITH PROPOFOL;  Surgeon: Lin Landsman, MD;  Location: [facility] ENDOSCOPY;  Service: Gastroenterology;  Laterality: N/A;  . right pinky finger  2013   pins inserted    No Known Allergies  Current Outpatient Medications  Medication Sig Dispense Refill  . citalopram (CELEXA) 20 MG tablet Take 1 tablet (20 mg total) by mouth daily. 90 tablet 3  . Ferrous Sulfate (IRON PO) Take by mouth daily.    Marland Kitchen guaiFENesin (MUCINEX) 600 MG 12 hr tablet Take 600 mg by mouth 2 (two) times daily as needed for cough.    . hydrocortisone  (ANUSOL-HC) 25 MG suppository Place 1 suppository (25 mg total) rectally 2 (two) times daily. 12 suppository 0  . ibuprofen (ADVIL) 200 MG tablet Take 400 mg by mouth every 6 (six) hours as needed for moderate pain.     No current facility-administered medications for this visit.    Family History Family History  Problem Relation Age of Onset  . Hyperlipidemia Mother   . Hypertension Mother   . Hyperlipidemia Father   . Diabetes Father   . Hypertension Brother   . Arthritis Maternal Grandfather        RA  . Heart disease Paternal Grandfather       Social History Social History   Tobacco Use  . Smoking status: Never Smoker  . Smokeless tobacco: Never Used  Substance Use Topics  . Alcohol use: Yes    Alcohol/week: 0.0 standard drinks    Comment: Rarely  . Drug use: No        Review of Systems  Constitutional: Negative.   HENT: Negative.   Eyes: Negative.   Respiratory: Negative.   Cardiovascular: Negative.   Gastrointestinal: Positive for abdominal pain and blood in stool.  Genitourinary: Negative.   Musculoskeletal: Negative.   Skin: Negative.   Neurological: Negative.   Endo/Heme/Allergies: Negative.       Physical Exam Blood pressure 121/80, pulse 82, temperature (!) 94.6 F (34.8 C), temperature source Temporal, height 5\' 11"  (1.803 m), weight 225 lb 6.4 oz (102.2 kg), SpO2 98 %. Last Weight  Most  recent update: 11/04/2019 10:26 AM   Weight  102.2 kg (225 lb 6.4 oz)            CONSTITUTIONAL: Well developed, and nourished, appropriately responsive and aware without distress.   EYES: Sclera non-icteric.   EARS, NOSE, MOUTH AND THROAT: Mask worn.    Hearing is intact to voice.  NECK: Trachea is midline, and there is no jugular venous distension.  LYMPH NODES:  Lymph nodes in the neck are not enlarged. RESPIRATORY:  Lungs are clear, and breath sounds are equal bilaterally. Normal respiratory effort without pathologic use of accessory  muscles. CARDIOVASCULAR: Heart is regular in rate and rhythm. GI: The abdomen is soft, nontender, and nondistended. There were no palpable masses. I did not appreciate hepatosplenomegaly. There were normal bowel sounds. GU: Minimal subdermal external hemorrhoidal extension without external hemorrhoidal tags present.  If the coccyx is 12:00 at 1:00 there is a prominent internal hemorrhoidal pile that extends past the dentate line.  There are other diffuse hemorrhoids present but no prominent piles other than the above.  No gross bleeding. MUSCULOSKELETAL:  Symmetrical muscle tone appreciated in all four extremities.    SKIN: Skin turgor is normal. No pathologic skin lesions appreciated.  NEUROLOGIC:  Motor and sensation appear grossly normal.  Cranial nerves are grossly without defect. PSYCH:  Alert and oriented to person, place and time. Affect is appropriate for situation.  Data Reviewed I have personally reviewed what is currently available of the patient's imaging, recent labs and medical records.   Labs:  CBC Latest Ref Rng & Units 10/23/2019 09/01/2019 01/23/2019  WBC 4.0 - 10.5 K/uL 7.7 7.8 9.7  Hemoglobin 13.0 - 17.0 g/dL 10.3(L) 10.9(L) 10.6(L)  Hematocrit 39.0 - 52.0 % 36.1(L) 35.3(L) 36.1(L)  Platelets 150 - 400 K/uL 344 263 281   CMP Latest Ref Rng & Units 10/23/2019 09/02/2017 07/12/2015  Glucose 70 - 99 mg/dL 88 105(H) 98  BUN 6 - 20 mg/dL 10 12 14   Creatinine 0.61 - 1.24 mg/dL 1.13 1.12 1.01  Sodium 135 - 145 mmol/L 141 139 141  Potassium 3.5 - 5.1 mmol/L 3.6 3.5 4.1  Chloride 98 - 111 mmol/L 110 110 107(H)  CO2 22 - 32 mmol/L 23 22 21   Calcium 8.9 - 10.3 mg/dL 9.4 8.7(L) 9.4  Total Protein 6.5 - 8.1 g/dL 7.3 - 7.1  Total Bilirubin 0.3 - 1.2 mg/dL 0.5 - 0.4  Alkaline Phos 38 - 126 U/L 67 - 101  AST 15 - 41 U/L 26 - 29  ALT 0 - 44 U/L 40 - 69(H)     Imaging:  Within last 24 hrs: No results found.  Assessment    Hemorrhoids, internal/prolapsing/external Patient Active  Problem List   Diagnosis Date Noted  . Hemorrhoids   . Iron deficiency anemia 09/04/2017  . Depression, major, single episode, in partial remission ([place]) 06/29/2017  . Sleep apnea 06/29/2017  . Anxiety 01/08/2015  . Insomnia 01/08/2015    Plan    I offered to proceed with at least a single column hemorrhoidectomy, possibly more after rectal exam under anesthesia.  I believe he understands the risks/benefits of proceeding which include but are not limited to anesthesia, bleeding, recurrence, anal dysfunction, soilage/discharge.  I believe he understands either not all-inclusive, questions answered, no guarantees ever expressed or implied.  Face-to-face time spent with the patient and accompanying care providers(if present) was 30 minutes, with more than 50% of the time spent counseling, educating, and coordinating care of the patient.  Ronny Bacon M.D., FACS 11/04/2019, 11:02 AM

## 2019-11-04 NOTE — Patient Instructions (Addendum)
Our surgery scheduler Marzetta Board will contact you within the next 24-48 hours. During that call, Marzetta Board will discuss the preparation prior to surgery and she will also discuss the different dates and times for surgery. Please have the BLUE sheet available when she contacts you. If you have any questions or concerns, please feel free to contact our office. Hemorrhoids Hemorrhoids are swollen veins in and around the rectum or anus. There are two types of hemorrhoids:  Internal hemorrhoids. These occur in the veins that are just inside the rectum. They may poke through to the outside and become irritated and painful.  External hemorrhoids. These occur in the veins that are outside the anus and can be felt as a painful swelling or hard lump near the anus. Most hemorrhoids do not cause serious problems, and they can be managed with home treatments such as diet and lifestyle changes. If home treatments do not help the symptoms, procedures can be done to shrink or remove the hemorrhoids. What are the causes? This condition is caused by increased pressure in the anal area. This pressure may result from various things, including:  Constipation.  Straining to have a bowel movement.  Diarrhea.  Pregnancy.  Obesity.  Sitting for long periods of time.  Heavy lifting or other activity that causes you to strain.  Anal sex.  Riding a bike for a long period of time. What are the signs or symptoms? Symptoms of this condition include:  Pain.  Anal itching or irritation.  Rectal bleeding.  Leakage of stool (feces).  Anal swelling.  One or more lumps around the anus. How is this diagnosed? This condition can often be diagnosed through a visual exam. Other exams or tests may also be done, such as:  An exam that involves feeling the rectal area with a gloved hand (digital rectal exam).  An exam of the anal canal that is done using a small tube (anoscope).  A blood test, if you have lost a  significant amount of blood.  A test to look inside the colon using a flexible tube with a camera on the end (sigmoidoscopy or colonoscopy). How is this treated? This condition can usually be treated at home. However, various procedures may be done if dietary changes, lifestyle changes, and other home treatments do not help your symptoms. These procedures can help make the hemorrhoids smaller or remove them completely. Some of these procedures involve surgery, and others do not. Common procedures include:  Rubber band ligation. Rubber bands are placed at the base of the hemorrhoids to cut off their blood supply.  Sclerotherapy. Medicine is injected into the hemorrhoids to shrink them.  Infrared coagulation. A type of light energy is used to get rid of the hemorrhoids.  Hemorrhoidectomy surgery. The hemorrhoids are surgically removed, and the veins that supply them are tied off.  Stapled hemorrhoidopexy surgery. The surgeon staples the base of the hemorrhoid to the rectal wall. Follow these instructions at home: Eating and drinking   Eat foods that have a lot of fiber in them, such as whole grains, beans, nuts, fruits, and vegetables.  Ask your health care provider about taking products that have added fiber (fiber supplements).  Reduce the amount of fat in your diet. You can do this by eating low-fat dairy products, eating less red meat, and avoiding processed foods.  Drink enough fluid to keep your urine pale yellow. Managing pain and swelling   Take warm sitz baths for 20 minutes, 3-4 times a day to  ease pain and discomfort. You may do this in a bathtub or using a portable sitz bath that fits over the toilet.  If directed, apply ice to the affected area. Using ice packs between sitz baths may be helpful. ? Put ice in a plastic bag. ? Place a towel between your skin and the bag. ? Leave the ice on for 20 minutes, 2-3 times a day. General instructions  Take over-the-counter and  prescription medicines only as told by your health care provider.  Use medicated creams or suppositories as told.  Get regular exercise. Ask your health care provider how much and what kind of exercise is best for you. In general, you should do moderate exercise for at least 30 minutes on most days of the week (150 minutes each week). This can include activities such as walking, biking, or yoga.  Go to the bathroom when you have the urge to have a bowel movement. Do not wait.  Avoid straining to have bowel movements.  Keep the anal area dry and clean. Use wet toilet paper or moist towelettes after a bowel movement.  Do not sit on the toilet for long periods of time. This increases blood pooling and pain.  Keep all follow-up visits as told by your health care provider. This is important. Contact a health care provider if you have:  Increasing pain and swelling that are not controlled by treatment or medicine.  Difficulty having a bowel movement, or you are unable to have a bowel movement.  Pain or inflammation outside the area of the hemorrhoids. Get help right away if you have:  Uncontrolled bleeding from your rectum. Summary  Hemorrhoids are swollen veins in and around the rectum or anus.  Most hemorrhoids can be managed with home treatments such as diet and lifestyle changes.  Taking warm sitz baths can help ease pain and discomfort.  In severe cases, procedures or surgery can be done to shrink or remove the hemorrhoids. This information is not intended to replace advice given to you by your health care provider. Make sure you discuss any questions you have with your health care provider. Document Revised: 12/13/2018 Document Reviewed: 12/06/2017 Elsevier Patient Education  [place].    Surgical Procedures for Hemorrhoids, Care After This sheet gives you information about how to care for yourself after your procedure. Your health care provider may also give you  more specific instructions. If you have problems or questions, contact your health care provider. What can I expect after the procedure? After the procedure, it is common to have:  Rectal pain.  Pain when you are having a bowel movement.  Slight rectal bleeding. This is more likely to happen with the first bowel movement after surgery. Follow these instructions at home: Medicines  Take over-the-counter and prescription medicines only as told by your health care provider.  If you were prescribed an antibiotic medicine, use it as told by your health care provider. Do not stop using the antibiotic even if your condition improves.  Ask your health care provider if the medicine prescribed to you requires you to avoid driving or using heavy machinery.  Use a stool softener or a bulk laxative as told by your health care provider. Eating and drinking  Follow instructions from your health care provider about what to eat or drink after your procedure.  You may need to take actions to prevent or treat constipation, such as: ? Drink enough fluid to keep your urine pale yellow. ? Take  over-the-counter or prescription medicines. ? Eat foods that are high in fiber, such as beans, whole grains, and fresh fruits and vegetables. ? Limit foods that are high in fat and processed sugars, such as fried or sweet foods. Activity   Rest as told by your health care provider.  Avoid sitting for a long time without moving. Get up to take short walks every 1-2 hours. This is important to improve blood flow and breathing. Ask for help if you feel weak or unsteady.  Return to your normal activities as told by your health care provider. Ask your health care provider what activities are safe for you.  Do not lift anything that is heavier than 10 lb (4.5 kg), or the limit that you are told, until your health care provider says that it is safe.  Do not strain to have a bowel movement.  Do not spend a long time  sitting on the toilet. General instructions   Take warm sitz baths for 15-20 minutes, 2-3 times a day to relieve soreness or itching and to keep the rectal area clean.  Apply ice packs to the area to reduce swelling and pain.  Do not drive for 24 hours if you were given a sedative during your procedure.  Keep all follow-up visits as told by your health care provider. This is important. Contact a health care provider if:  Your pain medicine is not helping.  You have a fever or chills.  You have bad smelling drainage.  You have a lot of swelling.  You become constipated.  You have trouble passing urine. Get help right away if:  You have very bad rectal pain.  You have heavy bleeding from your rectum. Summary  After the procedure, it is common to have pain and slight rectal bleeding.  Take warm sitz baths for 15-20 minutes, 2-3 times a day to relieve soreness or itching and to keep the rectal area clean.  Avoid straining when having a bowel movement.  Eat foods that are high in fiber, such as beans, whole grains, and fresh fruits and vegetables.  Take over-the-counter and prescription medicines only as told by your health care provider. This information is not intended to replace advice given to you by your health care provider. Make sure you discuss any questions you have with your health care provider. Document Revised: 01/01/2019 Document Reviewed: 06/04/2018 Elsevier Patient Education  [place].

## 2019-11-04 NOTE — Progress Notes (Signed)
Patient ID: ORISON ACY, male   DOB: 03-29-1986, 34 y.o.   MRN: [id number]  Chief Complaint: Bleeding hemorrhoids  History of Present Illness David Wong is a 34 y.o. male with hemorrhoidal issues going on for over 2 years, progressively worsening.  Previously seen and evaluated, treated with evaluation by colonoscopy and subsequent banding procedures.  He underwent a total of 3/6 months.  Currently has clotting blood past with bowel movements.  A trial of Anusol suppositories has not been helpful.  Still passing mixtures of bright and old blood.  Was seen end of March for rectal bleeding and abdominal pain.  Had such bleeding at 1 point in time that he had a running down his leg.  He reports utilization of fiber supplement, may not remain or maintain adequate hydration.  He reports twice daily bowel movements with minimal effort however recently due to the pain has become more difficult to pass his movements.  Past Medical History Past Medical History:  Diagnosis Date  . Anemia   . Anxiety   . Depression       Past Surgical History:  Procedure Laterality Date  . COLONOSCOPY WITH PROPOFOL N/A 10/08/2018   Procedure: COLONOSCOPY WITH PROPOFOL;  Surgeon: Lin Landsman, MD;  Location: [facility] ENDOSCOPY;  Service: Gastroenterology;  Laterality: N/A;  . ESOPHAGOGASTRODUODENOSCOPY (EGD) WITH PROPOFOL N/A 10/08/2018   Procedure: ESOPHAGOGASTRODUODENOSCOPY (EGD) WITH PROPOFOL;  Surgeon: Lin Landsman, MD;  Location: [facility] ENDOSCOPY;  Service: Gastroenterology;  Laterality: N/A;  . right pinky finger  2013   pins inserted    No Known Allergies  Current Outpatient Medications  Medication Sig Dispense Refill  . citalopram (CELEXA) 20 MG tablet Take 1 tablet (20 mg total) by mouth daily. 90 tablet 3  . Ferrous Sulfate (IRON PO) Take by mouth daily.    Marland Kitchen guaiFENesin (MUCINEX) 600 MG 12 hr tablet Take 600 mg by mouth 2 (two) times daily as needed for cough.    . hydrocortisone  (ANUSOL-HC) 25 MG suppository Place 1 suppository (25 mg total) rectally 2 (two) times daily. 12 suppository 0  . ibuprofen (ADVIL) 200 MG tablet Take 400 mg by mouth every 6 (six) hours as needed for moderate pain.     No current facility-administered medications for this visit.    Family History Family History  Problem Relation Age of Onset  . Hyperlipidemia Mother   . Hypertension Mother   . Hyperlipidemia Father   . Diabetes Father   . Hypertension Brother   . Arthritis Maternal Grandfather        RA  . Heart disease Paternal Grandfather       Social History Social History   Tobacco Use  . Smoking status: Never Smoker  . Smokeless tobacco: Never Used  Substance Use Topics  . Alcohol use: Yes    Alcohol/week: 0.0 standard drinks    Comment: Rarely  . Drug use: No        Review of Systems  Constitutional: Negative.   HENT: Negative.   Eyes: Negative.   Respiratory: Negative.   Cardiovascular: Negative.   Gastrointestinal: Positive for abdominal pain and blood in stool.  Genitourinary: Negative.   Musculoskeletal: Negative.   Skin: Negative.   Neurological: Negative.   Endo/Heme/Allergies: Negative.       Physical Exam Blood pressure 121/80, pulse 82, temperature (!) 94.6 F (34.8 C), temperature source Temporal, height 5\' 11"  (1.803 m), weight 225 lb 6.4 oz (102.2 kg), SpO2 98 %. Last Weight  Most  recent update: 11/04/2019 10:26 AM   Weight  102.2 kg (225 lb 6.4 oz)            CONSTITUTIONAL: Well developed, and nourished, appropriately responsive and aware without distress.   EYES: Sclera non-icteric.   EARS, NOSE, MOUTH AND THROAT: Mask worn.    Hearing is intact to voice.  NECK: Trachea is midline, and there is no jugular venous distension.  LYMPH NODES:  Lymph nodes in the neck are not enlarged. RESPIRATORY:  Lungs are clear, and breath sounds are equal bilaterally. Normal respiratory effort without pathologic use of accessory  muscles. CARDIOVASCULAR: Heart is regular in rate and rhythm. GI: The abdomen is soft, nontender, and nondistended. There were no palpable masses. I did not appreciate hepatosplenomegaly. There were normal bowel sounds. GU: Minimal subdermal external hemorrhoidal extension without external hemorrhoidal tags present.  If the coccyx is 12:00 at 1:00 there is a prominent internal hemorrhoidal pile that extends past the dentate line.  There are other diffuse hemorrhoids present but no prominent piles other than the above.  No gross bleeding. MUSCULOSKELETAL:  Symmetrical muscle tone appreciated in all four extremities.    SKIN: Skin turgor is normal. No pathologic skin lesions appreciated.  NEUROLOGIC:  Motor and sensation appear grossly normal.  Cranial nerves are grossly without defect. PSYCH:  Alert and oriented to person, place and time. Affect is appropriate for situation.  Data Reviewed I have personally reviewed what is currently available of the patient's imaging, recent labs and medical records.   Labs:  CBC Latest Ref Rng & Units 10/23/2019 09/01/2019 01/23/2019  WBC 4.0 - 10.5 K/uL 7.7 7.8 9.7  Hemoglobin 13.0 - 17.0 g/dL 10.3(L) 10.9(L) 10.6(L)  Hematocrit 39.0 - 52.0 % 36.1(L) 35.3(L) 36.1(L)  Platelets 150 - 400 K/uL 344 263 281   CMP Latest Ref Rng & Units 10/23/2019 09/02/2017 07/12/2015  Glucose 70 - 99 mg/dL 88 105(H) 98  BUN 6 - 20 mg/dL 10 12 14   Creatinine 0.61 - 1.24 mg/dL 1.13 1.12 1.01  Sodium 135 - 145 mmol/L 141 139 141  Potassium 3.5 - 5.1 mmol/L 3.6 3.5 4.1  Chloride 98 - 111 mmol/L 110 110 107(H)  CO2 22 - 32 mmol/L 23 22 21   Calcium 8.9 - 10.3 mg/dL 9.4 8.7(L) 9.4  Total Protein 6.5 - 8.1 g/dL 7.3 - 7.1  Total Bilirubin 0.3 - 1.2 mg/dL 0.5 - 0.4  Alkaline Phos 38 - 126 U/L 67 - 101  AST 15 - 41 U/L 26 - 29  ALT 0 - 44 U/L 40 - 69(H)     Imaging:  Within last 24 hrs: No results found.  Assessment    Hemorrhoids, internal/prolapsing/external Patient Active  Problem List   Diagnosis Date Noted  . Hemorrhoids   . Iron deficiency anemia 09/04/2017  . Depression, major, single episode, in partial remission ([place]) 06/29/2017  . Sleep apnea 06/29/2017  . Anxiety 01/08/2015  . Insomnia 01/08/2015    Plan    I offered to proceed with at least a single column hemorrhoidectomy, possibly more after rectal exam under anesthesia.  I believe he understands the risks/benefits of proceeding which include but are not limited to anesthesia, bleeding, recurrence, anal dysfunction, soilage/discharge.  I believe he understands either not all-inclusive, questions answered, no guarantees ever expressed or implied.  Face-to-face time spent with the patient and accompanying care providers(if present) was 30 minutes, with more than 50% of the time spent counseling, educating, and coordinating care of the patient.  Ronny Bacon M.D., FACS 11/04/2019, 11:02 AM

## 2019-11-07 ENCOUNTER — Telehealth: Payer: Self-pay | Admitting: Surgery

## 2019-11-07 NOTE — Telephone Encounter (Signed)
Pt has been advised of Pre-Admission date/time, COVID Testing date and Surgery date.  Surgery Date: 11/14/19 Preadmission Testing Date: 11/10/19 (phone 8a-1p) Covid Testing Date: 11/12/19 - patient advised to go to the [facility] ([place]) between 8a-1p   Patient has been made aware to call [phone], between 1-3:00pm the day before surgery, to find out what time to arrive for surgery.

## 2019-11-10 ENCOUNTER — Other Ambulatory Visit: Payer: Self-pay

## 2019-11-10 ENCOUNTER — Telehealth: Payer: Self-pay | Admitting: Nurse Practitioner

## 2019-11-10 ENCOUNTER — Encounter
Admission: RE | Admit: 2019-11-10 | Discharge: 2019-11-10 | Disposition: A | Discharge status: Home / Self Care | Payer: BC Managed Care – PPO | Source: Ambulatory Visit | Attending: Surgery | Admitting: Surgery

## 2019-11-10 ENCOUNTER — Ambulatory Visit: Payer: Self-pay | Admitting: Surgery

## 2019-11-10 ENCOUNTER — Telehealth: Payer: Self-pay | Admitting: Surgery

## 2019-11-10 ENCOUNTER — Encounter: Payer: Self-pay | Admitting: Nurse Practitioner

## 2019-11-10 HISTORY — DX: Gastro-esophageal reflux disease without esophagitis: K21.9

## 2019-11-10 HISTORY — DX: Sleep apnea, unspecified: G47.30

## 2019-11-10 NOTE — Patient Instructions (Addendum)
Your procedure is scheduled on: 11-17-19 [facility] Report to Same Day Surgery 2nd floor medical mall [facility] Entrance-take elevator on left to 2nd floor.  Check in with surgery information desk.) To find out your arrival time please call [phone] between 1PM - 3PM on 11-14-19 FRIDAY  Remember: Instructions that are not followed completely may result in serious medical risk, up to and including death, or upon the discretion of your surgeon and anesthesiologist your surgery may need to be rescheduled.    _x___ 1. Do not eat food after midnight the night before your procedure. NO GUM OR CANDY AFTER MIDNIGHT. You may drink clear liquids up to 2 hours before you are scheduled to arrive at the hospital for your procedure.  Do not drink clear liquids within 2 hours of your scheduled arrival to the hospital.  Clear liquids include  --Water or Apple juice without pulp  --Clear carbohydrate beverage such as ClearFast or Gatorade  --Black Coffee or Clear Tea (No milk, no creamers, do not add anything to the coffee or Tea   ____Ensure clear carbohydrate drink on the way to the hospital for bariatric patients  ____Ensure clear carbohydrate drink 3 hours before surgery.    __x__ 2. No Alcohol for 24 hours before or after surgery.   __x__3. No Smoking or e-cigarettes for 24 prior to surgery.  Do not use any chewable tobacco products for at least 6 hour prior to surgery   ____  4. Bring all medications with you on the day of surgery if instructed.    __x__ 5. Notify your doctor if there is any change in your medical condition     (cold, fever, infections).    x___6. On the morning of surgery brush your teeth with toothpaste and water.  You may rinse your mouth with mouth wash if you wish.  Do not swallow any toothpaste or mouthwash.   Do not wear jewelry, make-up, hairpins, clips or nail polish.  Do not wear lotions, powders, or perfumes. You may wear deodorant.  Do not shave 48 hours prior  to surgery. Men may shave face and neck.  Do not bring valuables to the hospital.    [facility] is not responsible for any belongings or valuables.               Contacts, dentures or bridgework may not be worn into surgery.  Leave your suitcase in the car. After surgery it may be brought to your room.  For patients admitted to the hospital, discharge time is determined by your treatment team.     Patients discharged the day of surgery will not be allowed to drive home.  You will need someone to drive you home and stay with you the night of your procedure.     Please read over the following fact sheets that you were given:   [facility] Preparing for Surgery  _x___ TAKE THE FOLLOWING MEDICATION THE MORNING OF SURGERY WITH A SMALL SIP OF WATER. These include:  1. CELEXA (CITALOPRAM)      __X__Fleets enema as directed. (There are directions on the box.)  sodium phosphate (FLEET) 7-19 GM/118ML enema 1 enema 1 enema, Rectal, Once, Thu 11/13/19 at 0600, For 1 dose, Pre-op  sodium phosphate (FLEET) 7-19 GM/118ML enema 1 enema 1 enema, Rectal, Once, Wed 11/12/19 at 2200, For 1 dose, Pre-op   __X__ Use CHG Soap or sage wipes as directed on instruction sheet     __X__Stop Anti-inflammatories such as Advil,  Aleve, Ibuprofen, Motrin, Naproxen, Naprosyn, Goodies powders or aspirin products now-OK to take Tylenol.      ____ Stop supplements until after surgery.

## 2019-11-10 NOTE — Telephone Encounter (Signed)
I have printed work excuse note to back printer for this patient.  He does not have MyChart.

## 2019-11-10 NOTE — Telephone Encounter (Signed)
Pt has been advised of updated Pre-Admission date/time, COVID Testing date and Surgery date.  Surgery Date: 11/17/19 Preadmission Testing Date: 11/10/19 (phone 8a-1p) Covid Testing Date: 11/13/19 - patient advised to go to the [facility] ([place]) between 8a-1p  Patient has been made aware to call [phone], between 1-3:00pm the day before surgery, to find out what time to arrive for surgery.

## 2019-11-10 NOTE — Telephone Encounter (Signed)
Noted  

## 2019-11-10 NOTE — Telephone Encounter (Signed)
Called pt and advised him that his note was ready he states that he will probably get [name] to pick it up

## 2019-11-10 NOTE — Telephone Encounter (Signed)
Copied from [place] [phone]. Topic: General - Call Back - No Documentation >> Nov 10, 2019  9:19 AM Erick Blinks wrote: Reason for CRM: Pt is requesting a call back regarding a doctor's note needed in order to stay out of work until his surgery for hemorrhoids. Patient is in a lot of pain and cannot lift anything. Please advise  Best contact: [phone]

## 2019-11-12 ENCOUNTER — Ambulatory Visit: Payer: Self-pay | Admitting: Surgery

## 2019-11-12 ENCOUNTER — Other Ambulatory Visit: Payer: BC Managed Care – PPO

## 2019-11-12 DIAGNOSIS — K645 Perianal venous thrombosis: Secondary | ICD-10-CM

## 2019-11-12 NOTE — Pre-Procedure Instructions (Signed)
Called Dr. Forest Becker office regarding need for orders that were requested on 11/10/19. Spoke with [employer]. She will notify Dr. Christian Mate that we are still in need of orders for this patient.

## 2019-11-13 ENCOUNTER — Other Ambulatory Visit: Payer: Self-pay

## 2019-11-13 ENCOUNTER — Other Ambulatory Visit
Admission: RE | Admit: 2019-11-13 | Discharge: 2019-11-13 | Disposition: A | Discharge status: Home / Self Care | Payer: BC Managed Care – PPO | Source: Ambulatory Visit | Attending: Surgery | Admitting: Surgery

## 2019-11-13 DIAGNOSIS — Z20822 Contact with and (suspected) exposure to covid-19: Secondary | ICD-10-CM | POA: Diagnosis not present

## 2019-11-13 DIAGNOSIS — Z01818 Encounter for other preprocedural examination: Secondary | ICD-10-CM | POA: Diagnosis not present

## 2019-11-13 DIAGNOSIS — G4730 Sleep apnea, unspecified: Secondary | ICD-10-CM | POA: Diagnosis not present

## 2019-11-13 LAB — SARS CORONAVIRUS 2 (TAT 6-24 HRS): SARS Coronavirus 2: NEGATIVE

## 2019-11-16 MED ORDER — METRONIDAZOLE IN NACL 5-0.79 MG/ML-% IV SOLN
500.0000 mg | INTRAVENOUS | Status: AC
  Administered 2019-11-17: 10:00:00 500 mg via INTRAVENOUS
  Filled 2019-11-16: qty 100

## 2019-11-17 ENCOUNTER — Other Ambulatory Visit: Payer: Self-pay

## 2019-11-17 ENCOUNTER — Encounter: Payer: Self-pay | Admitting: Surgery

## 2019-11-17 ENCOUNTER — Telehealth: Payer: Self-pay | Admitting: Nurse Practitioner

## 2019-11-17 ENCOUNTER — Ambulatory Visit: Payer: BC Managed Care – PPO | Admitting: Anesthesiology

## 2019-11-17 ENCOUNTER — Ambulatory Visit
Admission: RE | Admit: 2019-11-17 | Discharge: 2019-11-17 | Disposition: A | Discharge status: Home / Self Care | Payer: BC Managed Care – PPO | Attending: Surgery | Admitting: Surgery

## 2019-11-17 ENCOUNTER — Encounter
Admission: RE | Disposition: A | Discharge status: Home / Self Care | Payer: Self-pay | Source: Home / Self Care | Attending: Surgery

## 2019-11-17 DIAGNOSIS — K645 Perianal venous thrombosis: Secondary | ICD-10-CM | POA: Diagnosis not present

## 2019-11-17 DIAGNOSIS — F418 Other specified anxiety disorders: Secondary | ICD-10-CM | POA: Diagnosis not present

## 2019-11-17 DIAGNOSIS — K649 Unspecified hemorrhoids: Secondary | ICD-10-CM | POA: Diagnosis not present

## 2019-11-17 DIAGNOSIS — F419 Anxiety disorder, unspecified: Secondary | ICD-10-CM | POA: Insufficient documentation

## 2019-11-17 DIAGNOSIS — K641 Second degree hemorrhoids: Secondary | ICD-10-CM | POA: Insufficient documentation

## 2019-11-17 DIAGNOSIS — G4730 Sleep apnea, unspecified: Secondary | ICD-10-CM | POA: Diagnosis not present

## 2019-11-17 DIAGNOSIS — K644 Residual hemorrhoidal skin tags: Secondary | ICD-10-CM | POA: Insufficient documentation

## 2019-11-17 DIAGNOSIS — K219 Gastro-esophageal reflux disease without esophagitis: Secondary | ICD-10-CM | POA: Diagnosis not present

## 2019-11-17 DIAGNOSIS — Z79899 Other long term (current) drug therapy: Secondary | ICD-10-CM | POA: Insufficient documentation

## 2019-11-17 DIAGNOSIS — F329 Major depressive disorder, single episode, unspecified: Secondary | ICD-10-CM | POA: Diagnosis not present

## 2019-11-17 DIAGNOSIS — D509 Iron deficiency anemia, unspecified: Secondary | ICD-10-CM | POA: Insufficient documentation

## 2019-11-17 HISTORY — PX: HEMORRHOID SURGERY: SHX153

## 2019-11-17 SURGERY — HEMORRHOIDECTOMY
Anesthesia: General | Site: Rectum

## 2019-11-17 MED ORDER — FAMOTIDINE 20 MG PO TABS: 20.0000 mg | ORAL_TABLET | Freq: Once | ORAL | Status: AC

## 2019-11-17 MED ORDER — DEXAMETHASONE SODIUM PHOSPHATE 10 MG/ML IJ SOLN
INTRAMUSCULAR | Status: AC
  Filled 2019-11-17: qty 1

## 2019-11-17 MED ORDER — DIBUCAINE (PERIANAL) 1 % EX OINT
TOPICAL_OINTMENT | CUTANEOUS | Status: AC
  Filled 2019-11-17: qty 28

## 2019-11-17 MED ORDER — CHLORHEXIDINE GLUCONATE CLOTH 2 % EX PADS
6.0000 | MEDICATED_PAD | Freq: Once | CUTANEOUS | Status: AC
  Administered 2019-11-17: 6 via TOPICAL

## 2019-11-17 MED ORDER — FENTANYL CITRATE (PF) 100 MCG/2ML IJ SOLN
25.0000 ug | INTRAMUSCULAR | Status: DC | PRN
  Administered 2019-11-17 (×3): 25 ug via INTRAVENOUS

## 2019-11-17 MED ORDER — ACETAMINOPHEN 500 MG PO TABS: 1000.0000 mg | ORAL_TABLET | ORAL | Status: AC

## 2019-11-17 MED ORDER — GLYCOPYRROLATE 0.2 MG/ML IJ SOLN
INTRAMUSCULAR | Status: DC | PRN
  Administered 2019-11-17: .2 mg via INTRAVENOUS

## 2019-11-17 MED ORDER — ESMOLOL HCL 100 MG/10ML IV SOLN
INTRAVENOUS | Status: DC | PRN
  Administered 2019-11-17 (×2): 20 mg via INTRAVENOUS

## 2019-11-17 MED ORDER — FENTANYL CITRATE (PF) 100 MCG/2ML IJ SOLN
INTRAMUSCULAR | Status: AC
  Filled 2019-11-17: qty 2

## 2019-11-17 MED ORDER — SUGAMMADEX SODIUM 500 MG/5ML IV SOLN
INTRAVENOUS | Status: DC | PRN
  Administered 2019-11-17: 550 mg via INTRAVENOUS

## 2019-11-17 MED ORDER — PROPOFOL 10 MG/ML IV BOLUS
INTRAVENOUS | Status: DC | PRN
  Administered 2019-11-17: 180 mg via INTRAVENOUS
  Administered 2019-11-17: 70 mg via INTRAVENOUS

## 2019-11-17 MED ORDER — CELECOXIB 200 MG PO CAPS: 200.0000 mg | ORAL_CAPSULE | ORAL | Status: AC

## 2019-11-17 MED ORDER — ONDANSETRON HCL 4 MG/2ML IJ SOLN
INTRAMUSCULAR | Status: AC
  Filled 2019-11-17: qty 2

## 2019-11-17 MED ORDER — GABAPENTIN 300 MG PO CAPS: 300.0000 mg | ORAL_CAPSULE | ORAL | Status: AC

## 2019-11-17 MED ORDER — METRONIDAZOLE IN NACL 5-0.79 MG/ML-% IV SOLN
500.0000 mg | INTRAVENOUS | Status: AC
  Filled 2019-11-17: qty 100

## 2019-11-17 MED ORDER — PROPOFOL 10 MG/ML IV BOLUS
INTRAVENOUS | Status: AC
  Filled 2019-11-17: qty 20

## 2019-11-17 MED ORDER — GELATIN ABSORBABLE 100 EX MISC
CUTANEOUS | Status: DC | PRN
  Administered 2019-11-17: 1

## 2019-11-17 MED ORDER — LACTATED RINGERS IV SOLN: INTRAVENOUS | Status: DC

## 2019-11-17 MED ORDER — CELECOXIB 200 MG PO CAPS
ORAL_CAPSULE | ORAL | Status: AC
  Administered 2019-11-17: 200 mg via ORAL
  Filled 2019-11-17: qty 1

## 2019-11-17 MED ORDER — FENTANYL CITRATE (PF) 100 MCG/2ML IJ SOLN
INTRAMUSCULAR | Status: AC
  Administered 2019-11-17: 25 ug via INTRAVENOUS
  Filled 2019-11-17: qty 2

## 2019-11-17 MED ORDER — ROCURONIUM BROMIDE 100 MG/10ML IV SOLN
INTRAVENOUS | Status: DC | PRN
  Administered 2019-11-17: 5 mg via INTRAVENOUS
  Administered 2019-11-17: 40 mg via INTRAVENOUS
  Administered 2019-11-17: 10 mg via INTRAVENOUS

## 2019-11-17 MED ORDER — DEXAMETHASONE SODIUM PHOSPHATE 10 MG/ML IJ SOLN
INTRAMUSCULAR | Status: DC | PRN
  Administered 2019-11-17: 10 mg via INTRAVENOUS

## 2019-11-17 MED ORDER — FLEET ENEMA 7-19 GM/118ML RE ENEM
1.0000 | ENEMA | Freq: Once | RECTAL | Status: AC
  Administered 2019-11-17: 1 via RECTAL

## 2019-11-17 MED ORDER — HYDROCODONE-ACETAMINOPHEN 5-325 MG PO TABS
1.0000 | ORAL_TABLET | Freq: Once | ORAL | Status: AC
  Administered 2019-11-17: 1 via ORAL

## 2019-11-17 MED ORDER — DEXMEDETOMIDINE HCL IN NACL 200 MCG/50ML IV SOLN
INTRAVENOUS | Status: DC | PRN
  Administered 2019-11-17: 8 ug via INTRAVENOUS

## 2019-11-17 MED ORDER — FLEET ENEMA 7-19 GM/118ML RE ENEM: 1.0000 | ENEMA | Freq: Once | RECTAL | Status: AC

## 2019-11-17 MED ORDER — CHLORHEXIDINE GLUCONATE CLOTH 2 % EX PADS: 6.0000 | MEDICATED_PAD | Freq: Once | CUTANEOUS | Status: AC

## 2019-11-17 MED ORDER — BUPIVACAINE-EPINEPHRINE (PF) 0.5% -1:200000 IJ SOLN
INTRAMUSCULAR | Status: AC
  Filled 2019-11-17: qty 30

## 2019-11-17 MED ORDER — HYDROCODONE-ACETAMINOPHEN 5-325 MG PO TABS
ORAL_TABLET | ORAL | Status: AC
  Filled 2019-11-17: qty 1

## 2019-11-17 MED ORDER — GELATIN ABSORBABLE 100 CM EX MISC
CUTANEOUS | Status: AC
  Filled 2019-11-17: qty 1

## 2019-11-17 MED ORDER — BUPIVACAINE LIPOSOME 1.3 % IJ SUSP: 20.0000 mL | Freq: Once | INTRAMUSCULAR | Status: DC

## 2019-11-17 MED ORDER — LIDOCAINE HCL (PF) 2 % IJ SOLN
INTRAMUSCULAR | Status: AC
  Filled 2019-11-17: qty 10

## 2019-11-17 MED ORDER — FLEET ENEMA 7-19 GM/118ML RE ENEM: 1.0000 | ENEMA | Freq: Once | RECTAL | Status: DC

## 2019-11-17 MED ORDER — MIDAZOLAM HCL 2 MG/2ML IJ SOLN
INTRAMUSCULAR | Status: AC
  Filled 2019-11-17: qty 2

## 2019-11-17 MED ORDER — ESMOLOL HCL 100 MG/10ML IV SOLN
INTRAVENOUS | Status: AC
  Filled 2019-11-17: qty 10

## 2019-11-17 MED ORDER — GLYCOPYRROLATE 0.2 MG/ML IJ SOLN
INTRAMUSCULAR | Status: AC
  Filled 2019-11-17: qty 1

## 2019-11-17 MED ORDER — HYDROCODONE-ACETAMINOPHEN 5-325 MG PO TABS: 1.0000 | ORAL_TABLET | Freq: Four times a day (QID) | ORAL | 0 refills | Status: DC | PRN

## 2019-11-17 MED ORDER — BUPIVACAINE LIPOSOME 1.3 % IJ SUSP
INTRAMUSCULAR | Status: AC
  Filled 2019-11-17: qty 20

## 2019-11-17 MED ORDER — SUCCINYLCHOLINE CHLORIDE 200 MG/10ML IV SOSY
PREFILLED_SYRINGE | INTRAVENOUS | Status: AC
  Filled 2019-11-17: qty 10

## 2019-11-17 MED ORDER — MIDAZOLAM HCL 2 MG/2ML IJ SOLN
INTRAMUSCULAR | Status: DC | PRN
  Administered 2019-11-17: 2 mg via INTRAVENOUS

## 2019-11-17 MED ORDER — ONDANSETRON HCL 4 MG/2ML IJ SOLN: 4.0000 mg | Freq: Once | INTRAMUSCULAR | Status: DC | PRN

## 2019-11-17 MED ORDER — DIBUCAINE 1 % EX OINT
TOPICAL_OINTMENT | CUTANEOUS | Status: DC | PRN
  Administered 2019-11-17: 1

## 2019-11-17 MED ORDER — ACETAMINOPHEN 500 MG PO TABS
ORAL_TABLET | ORAL | Status: AC
  Administered 2019-11-17: 1000 mg via ORAL
  Filled 2019-11-17: qty 2

## 2019-11-17 MED ORDER — GELATIN ABSORBABLE 12-7 MM EX MISC
CUTANEOUS | Status: AC
  Filled 2019-11-17: qty 1

## 2019-11-17 MED ORDER — ONDANSETRON HCL 4 MG/2ML IJ SOLN
INTRAMUSCULAR | Status: DC | PRN
  Administered 2019-11-17: 4 mg via INTRAVENOUS

## 2019-11-17 MED ORDER — FENTANYL CITRATE (PF) 100 MCG/2ML IJ SOLN
INTRAMUSCULAR | Status: DC | PRN
  Administered 2019-11-17 (×3): 50 ug via INTRAVENOUS

## 2019-11-17 MED ORDER — LACTATED RINGERS IV SOLN: INTRAVENOUS | Status: DC | PRN

## 2019-11-17 MED ORDER — BUPIVACAINE LIPOSOME 1.3 % IJ SUSP
INTRAMUSCULAR | Status: DC | PRN
  Administered 2019-11-17: 20 mL

## 2019-11-17 MED ORDER — SUGAMMADEX SODIUM 500 MG/5ML IV SOLN
INTRAVENOUS | Status: AC
  Filled 2019-11-17: qty 5

## 2019-11-17 MED ORDER — PROPOFOL 500 MG/50ML IV EMUL
INTRAVENOUS | Status: AC
  Filled 2019-11-17: qty 50

## 2019-11-17 MED ORDER — SUCCINYLCHOLINE CHLORIDE 20 MG/ML IJ SOLN
INTRAMUSCULAR | Status: DC | PRN
  Administered 2019-11-17: 120 mg via INTRAVENOUS

## 2019-11-17 MED ORDER — LIDOCAINE HCL (CARDIAC) PF 100 MG/5ML IV SOSY
PREFILLED_SYRINGE | INTRAVENOUS | Status: DC | PRN
  Administered 2019-11-17: 100 mg via INTRAVENOUS

## 2019-11-17 MED ORDER — BUPIVACAINE-EPINEPHRINE 0.25% -1:200000 IJ SOLN
INTRAMUSCULAR | Status: DC | PRN
  Administered 2019-11-17: 25 mL

## 2019-11-17 MED ORDER — DEXMEDETOMIDINE HCL IN NACL 80 MCG/20ML IV SOLN
INTRAVENOUS | Status: AC
  Filled 2019-11-17: qty 20

## 2019-11-17 MED ORDER — ROCURONIUM BROMIDE 10 MG/ML (PF) SYRINGE
PREFILLED_SYRINGE | INTRAVENOUS | Status: AC
  Filled 2019-11-17: qty 10

## 2019-11-17 MED ORDER — FAMOTIDINE 20 MG PO TABS
ORAL_TABLET | ORAL | Status: AC
  Administered 2019-11-17: 20 mg via ORAL
  Filled 2019-11-17: qty 1

## 2019-11-17 MED ORDER — GABAPENTIN 300 MG PO CAPS
ORAL_CAPSULE | ORAL | Status: AC
  Administered 2019-11-17: 300 mg via ORAL
  Filled 2019-11-17: qty 1

## 2019-11-17 SURGICAL SUPPLY — 28 items
BLADE SURG 15 STRL LF DISP TIS (BLADE) ×1 IMPLANT
BLADE SURG 15 STRL SS (BLADE) ×1
BRIEF STRETCH MATERNITY 2XLG (MISCELLANEOUS) ×2 IMPLANT
CANISTER SUCT 1200ML W/VALVE (MISCELLANEOUS) ×2 IMPLANT
COVER WAND RF STERILE (DRAPES) ×2 IMPLANT
DRAPE LAPAROTOMY 100X77 ABD (DRAPES) ×2 IMPLANT
DRSG GAUZE FLUFF 36X18 (GAUZE/BANDAGES/DRESSINGS) ×2 IMPLANT
ELECT CAUTERY BLADE TIP 2.5 (TIP) ×2
ELECT REM PT RETURN 9FT ADLT (ELECTROSURGICAL) ×2
ELECTRODE CAUTERY BLDE TIP 2.5 (TIP) ×1 IMPLANT
ELECTRODE REM PT RTRN 9FT ADLT (ELECTROSURGICAL) ×1 IMPLANT
GLOVE ORTHO TXT STRL SZ7.5 (GLOVE) ×2 IMPLANT
GOWN STRL REUS W/ TWL LRG LVL3 (GOWN DISPOSABLE) ×2 IMPLANT
GOWN STRL REUS W/TWL LRG LVL3 (GOWN DISPOSABLE) ×2
KIT TURNOVER KIT A (KITS) ×2 IMPLANT
NEEDLE FILTER BLUNT 18X 1/2SAF (NEEDLE) ×1
NEEDLE FILTER BLUNT 18X1 1/2 (NEEDLE) ×1 IMPLANT
NEEDLE HYPO 22GX1.5 SAFETY (NEEDLE) ×2 IMPLANT
PACK BASIN MINOR (MISCELLANEOUS) ×2 IMPLANT
PAD ABD DERMACEA PRESS 5X9 (GAUZE/BANDAGES/DRESSINGS) IMPLANT
SHEARS HARMONIC 9CM CVD (BLADE) ×2 IMPLANT
SOL PREP PVP 2OZ (MISCELLANEOUS) ×2
SOLUTION PREP PVP 2OZ (MISCELLANEOUS) ×1 IMPLANT
SURGILUBE 2OZ TUBE FLIPTOP (MISCELLANEOUS) ×2 IMPLANT
SUT CHROMIC 3 0 SH 27 (SUTURE) ×8 IMPLANT
SWABSTK COMLB BENZOIN TINCTURE (MISCELLANEOUS) ×2 IMPLANT
SYR 10ML LL (SYRINGE) ×2 IMPLANT
TAPE CLOTH 3X10 WHT NS LF (GAUZE/BANDAGES/DRESSINGS) ×4 IMPLANT

## 2019-11-17 NOTE — Anesthesia Procedure Notes (Signed)
Procedure Name: Intubation Performed by: Kelton Pillar, CRNA Pre-anesthesia Checklist: Patient identified, Emergency Drugs available, Suction available and Patient being monitored Patient Re-evaluated:Patient Re-evaluated prior to induction Oxygen Delivery Method: Circle system utilized Preoxygenation: Pre-oxygenation with 100% oxygen Induction Type: IV induction Ventilation: Mask ventilation without difficulty Laryngoscope Size: McGraph and 4 Grade View: Grade I Tube type: Oral Tube size: 7.0 mm Number of attempts: 1 Airway Equipment and Method: Stylet Placement Confirmation: ETT inserted through vocal cords under direct vision,  positive ETCO2,  CO2 detector and breath sounds checked- equal and bilateral Secured at: 21 cm Tube secured with: Tape Dental Injury: Teeth and Oropharynx as per pre-operative assessment

## 2019-11-17 NOTE — Anesthesia Preprocedure Evaluation (Addendum)
Anesthesia Evaluation  Patient identified by MRN, date of birth, ID band Patient awake    Reviewed: Allergy & Precautions, H&P , NPO status , Patient's Chart, lab work & pertinent test results, reviewed documented beta blocker date and time   Airway Mallampati: II  TM Distance: >3 FB Neck ROM: full    Dental  (+) Teeth Intact   Pulmonary neg pulmonary ROS, sleep apnea , Current Smoker,    Pulmonary exam normal        Cardiovascular Exercise Tolerance: Good negative cardio ROS Normal cardiovascular exam Rhythm:regular Rate:Normal     Neuro/Psych PSYCHIATRIC DISORDERS Anxiety Depression negative neurological ROS  negative psych ROS   GI/Hepatic negative GI ROS, Neg liver ROS, GERD  Medicated,  Endo/Other  negative endocrine ROS  Renal/GU negative Renal ROS  negative genitourinary   Musculoskeletal   Abdominal   Peds  Hematology negative hematology ROS (+) Blood dyscrasia, anemia ,   Anesthesia Other Findings Past Medical History: No date: Anemia No date: Anxiety No date: Depression No date: GERD (gastroesophageal reflux disease)     Comment:  occ  No date: Sleep apnea     Comment:  does not have cpap Past Surgical History: 10/08/2018: COLONOSCOPY WITH PROPOFOL; N/A     Comment:  Procedure: COLONOSCOPY WITH PROPOFOL;  Surgeon: Lin Landsman, MD;  Location: [facility] ENDOSCOPY;  Service:               Gastroenterology;  Laterality: N/A; 10/08/2018: ESOPHAGOGASTRODUODENOSCOPY (EGD) WITH PROPOFOL; N/A     Comment:  Procedure: ESOPHAGOGASTRODUODENOSCOPY (EGD) WITH               PROPOFOL;  Surgeon: Lin Landsman, MD;  Location:               [facility] ENDOSCOPY;  Service: Gastroenterology;  Laterality:               N/A; 2013: right pinky finger     Comment:  pins inserted   Reproductive/Obstetrics negative OB ROS                             Anesthesia Physical Anesthesia  Plan  ASA: III  Anesthesia Plan: General ETT   Post-op Pain Management:    Induction:   PONV Risk Score and Plan:   Airway Management Planned:   Additional Equipment:   Intra-op Plan:   Post-operative Plan:   Informed Consent: I have reviewed the patients History and Physical, chart, labs and discussed the procedure including the risks, benefits and alternatives for the proposed anesthesia with the patient or authorized representative who has indicated his/her understanding and acceptance.     Dental Advisory Given  Plan Discussed with: CRNA  Anesthesia Plan Comments:         Anesthesia Quick Evaluation

## 2019-11-17 NOTE — Telephone Encounter (Signed)
Pt came in to leave paper work , Pt has apt on 11/18/19 (mychart video)    Copied from [place] [id number]. Topic: General - Inquiry >> Nov 17, 2019  2:53 PM Richardo Priest, [place] wrote: Reason for CRM: Patient called in stating he is going to be dropping off paperwork for his job, as he had surgery and was advised by surgeon to have PCP fill paperwork out. Please advise.

## 2019-11-17 NOTE — Op Note (Signed)
SURGICAL OPERATIVE REPORT   DATE OF PROCEDURE: 11/17/2019  ATTENDING Surgeon(s): Ronny Bacon, MD  ANESTHESIA: GETA  PRE-OPERATIVE DIAGNOSIS: Symptomatic (painful and bleeding) 2nd degree internal and external hemorrhoids with symptoms not well-controlled despite medical management   POST-OPERATIVE DIAGNOSIS: Same PROCEDURE(S):  1.) Anoscopy 2.)  Hemorrhoidectomy, internal and external 3 columns.  INTRAOPERATIVE FINDINGS: 2nd degree internal hemorrhoids and associated external hemorrhoids.   ESTIMATED BLOOD LOSS: Minimal (30 mL)  URINE OUTPUT: No foley   SPECIMENS: Hemorrhoids internal and external, 3 columns and 3 specimens.   COMPLICATIONS: None apparent   CONDITION AT END OF PROCEDURE: Hemodynamically stable and extubated   DISPOSITION OF PATIENT: PACU  INDICATIONS FOR PROCEDURE:   Patient is a 34 yo male who presented with symptomatic (bleeding) 2nd degree internal and external hemorrhoids not well-controlled with medical management.  All risks, benefits, and alternatives to above procedure were discussed with the patient, all of patient's questions were answered to her expressed satisfaction, and informed consent was obtained and documented.  DETAILS OF PROCEDURE: Patient was brought to the operative suite and appropriately identified. General anesthesia was administered along with appropriate pre-operative antibiotics, and endotracheal intubation was performed by anesthetist.  Repositioned to prone/jackknife and buttocks taped for exposure, operative site was prepped and draped in the usual sterile fashion, and following a brief time out, rigid anoscopy was performed with findings of no distal rectal masses, normal mucosa, 3 prominent areas involving anterior and posterior right and posterior left quadrants. Local anesthetic of half percent Marcaine with epinephrine was injected, and three hemorrhoidal pedicles were identified. An Pennington clamp was placed near the base  of each pedicle near the dentate line and retracted externally to exteriorize the hemorrhoidal pedicle. Retracted hemorrhoid was carefully dissected from underlying/adherent tissues, and harmonic focus was advanced across the base of the hemorrhoidal pedicle, and the above was repeated for all 3 internal/external hemorrhoids. Hemostasis was achieved/confirmed, a hemostatic locking 3-0 chromic suture is applied in running manner from proximally to distally, leaving the distal aspect open, additional local anesthetic of Exparel was injected with confirmation of hemostasis. Surgifoam is saturated with Nupercainal ointment was inserted into the anal canal for additional hemostasis and pain control.  The presence of subcutaneous varicosities persists posteriorly at 12:00, there is a band causing an eventration just proximal to this extra-anally, suspect may be due to a healed fissure, I felt it was prudent to leave this alone and assess response to the hemorrhoidectomy rather than recreate a fissure.   Patient was then safely able to be extubated, awakened, and transferred to PACU for post-operative monitoring and care.  I was present for all aspects of the above procedure, and no operative complications were apparent.

## 2019-11-17 NOTE — Telephone Encounter (Signed)
Noted, thank you

## 2019-11-17 NOTE — Transfer of Care (Signed)
Immediate Anesthesia Transfer of Care Note  Patient: David Wong  Procedure(s) Performed: HEMORRHOIDECTOMY Internal & External (N/A Rectum)  Patient Location: PACU  Anesthesia Type:General  Level of Consciousness: sedated  Airway & Oxygen Therapy: Patient connected to face mask oxygen  Post-op Assessment: Post -op Vital signs reviewed and stable  Post vital signs: stable  Last Vitals:  Vitals Value Taken Time  BP 127/82 11/17/19 1126  Temp    Pulse 91 11/17/19 1126  Resp 21 11/17/19 1126  SpO2 99 % 11/17/19 1126  Vitals shown include unvalidated device data.  Last Pain:  Vitals:   11/17/19 0807  TempSrc: Oral         Complications: No apparent anesthesia complications

## 2019-11-17 NOTE — Discharge Instructions (Addendum)
Surgical Procedures for Hemorrhoids, Care After This sheet gives you information about how to care for yourself after your procedure. Your health care provider may also give you more specific instructions. If you have problems or questions, contact your health care provider. What can I expect after the procedure? After the procedure, it is common to have:  Rectal pain.  Pain when you are having a bowel movement.  Slight rectal bleeding. This is more likely to happen with the first bowel movement after surgery. Follow these instructions at home: Medicines  Take over-the-counter and prescription medicines only as told by your health care provider.  If you were prescribed an antibiotic medicine, use it as told by your health care provider. Do not stop using the antibiotic even if your condition improves.  Ask your health care provider if the medicine prescribed to you requires you to avoid driving or using heavy machinery.  Use a stool softener or a bulk laxative as told by your health care provider. Eating and drinking  Follow instructions from your health care provider about what to eat or drink after your procedure.  You may need to take actions to prevent or treat constipation, such as: ? Drink enough fluid to keep your urine pale yellow. ? Take over-the-counter or prescription medicines. ? Eat foods that are high in fiber, such as beans, whole grains, and fresh fruits and vegetables. ? Limit foods that are high in fat and processed sugars, such as fried or sweet foods. Activity   Rest as told by your health care provider.  Avoid sitting for a long time without moving. Get up to take short walks every 1-2 hours. This is important to improve blood flow and breathing. Ask for help if you feel weak or unsteady.  Return to your normal activities as told by your health care provider. Ask your health care provider what activities are safe for you.  Do not lift anything that is  heavier than 10 lb (4.5 kg), or the limit that you are told, until your health care provider says that it is safe.  Do not strain to have a bowel movement.  Do not spend a long time sitting on the toilet. General instructions   Take warm sitz baths for 15-20 minutes, 2-3 times a day to relieve soreness or itching and to keep the rectal area clean.  Apply ice packs to the area to reduce swelling and pain.  Do not drive for 24 hours if you were given a sedative during your procedure.  Keep all follow-up visits as told by your health care provider. This is important. Contact a health care provider if:  Your pain medicine is not helping.  You have a fever or chills.  You have bad smelling drainage.  You have a lot of swelling.  You become constipated.  You have trouble passing urine. Get help right away if:  You have very bad rectal pain.  You have heavy bleeding from your rectum. Summary  After the procedure, it is common to have pain and slight rectal bleeding.  Take warm sitz baths for 15-20 minutes, 2-3 times a day to relieve soreness or itching and to keep the rectal area clean.  Avoid straining when having a bowel movement.  Eat foods that are high in fiber, such as beans, whole grains, and fresh fruits and vegetables.  Take over-the-counter and prescription medicines only as told by your health care provider. This information is not intended to replace advice given to  you by your health care provider. Make sure you discuss any questions you have with your health care provider. Document Revised: 01/01/2019 Document Reviewed: 06/04/2018 Elsevier Patient Education  2020 Elsevier Inc.   AMBULATORY SURGERY  DISCHARGE INSTRUCTIONS   1) The drugs that you were given will stay in your system until tomorrow so for the next 24 hours you should not:  A) Drive an automobile B) Make any legal decisions C) Drink any alcoholic beverage   2) You may resume regular  meals tomorrow.  Today it is better to start with liquids and gradually work up to solid foods.  You may eat anything you prefer, but it is better to start with liquids, then soup and crackers, and gradually work up to solid foods.   3) Please notify your doctor immediately if you have any unusual bleeding, trouble breathing, redness and pain at the surgery site, drainage, fever, or pain not relieved by medication.    4) Additional Instructions:        Please contact your physician with any problems or Same Day Surgery at [phone], Monday through Friday 6 am to 4 pm, or [place] at  Main number at [phone]. 

## 2019-11-17 NOTE — Interval H&P Note (Signed)
History and Physical Interval Note:  11/17/2019 9:20 AM  David Wong  has presented today for surgery, with the diagnosis of Perianal venous thrombosis.  The various methods of treatment have been discussed with the patient and family. After consideration of risks, benefits and other options for treatment, the patient has consented to  Procedure(s): HEMORRHOIDECTOMY Internal & External (N/A) as a surgical intervention.  The patient's history has been reviewed, patient examined, no change in status, stable for surgery.  I have reviewed the patient's chart and labs.  Questions were answered to the patient's satisfaction.     Ronny Bacon

## 2019-11-18 ENCOUNTER — Telehealth (INDEPENDENT_AMBULATORY_CARE_PROVIDER_SITE_OTHER): Payer: BC Managed Care – PPO | Admitting: Nurse Practitioner

## 2019-11-18 ENCOUNTER — Encounter: Payer: Self-pay | Admitting: Nurse Practitioner

## 2019-11-18 VITALS — Wt 225.0 lb

## 2019-11-18 DIAGNOSIS — Z0289 Encounter for other administrative examinations: Secondary | ICD-10-CM | POA: Diagnosis not present

## 2019-11-18 LAB — SURGICAL PATHOLOGY

## 2019-11-18 NOTE — Patient Instructions (Signed)
Hemorrhoids Hemorrhoids are swollen veins that may develop:  In the butt (rectum). These are called internal hemorrhoids.  Around the opening of the butt (anus). These are called external hemorrhoids. Hemorrhoids can cause pain, itching, or bleeding. Most of the time, they do not cause serious problems. They usually get better with diet changes, lifestyle changes, and other home treatments. What are the causes? This condition may be caused by:  Having trouble pooping (constipation).  Pushing hard (straining) to poop.  Watery poop (diarrhea).  Pregnancy.  Being very overweight (obese).  Sitting for long periods of time.  Heavy lifting or other activity that causes you to strain.  Anal sex.  Riding a bike for a long period of time. What are the signs or symptoms? Symptoms of this condition include:  Pain.  Itching or soreness in the butt.  Bleeding from the butt.  Leaking poop.  Swelling in the area.  One or more lumps around the opening of your butt. How is this diagnosed? A doctor can often diagnose this condition by looking at the affected area. The doctor may also:  Do an exam that involves feeling the area with a gloved hand (digital rectal exam).  Examine the area inside your butt using a small tube (anoscope).  Order blood tests. This may be done if you have lost a lot of blood.  Have you get a test that involves looking inside the colon using a flexible tube with a camera on the end (sigmoidoscopy or colonoscopy). How is this treated? This condition can usually be treated at home. Your doctor may tell you to change what you eat, make lifestyle changes, or try home treatments. If these do not help, procedures can be done to remove the hemorrhoids or make them smaller. These may involve:  Placing rubber bands at the base of the hemorrhoids to cut off their blood supply.  Injecting medicine into the hemorrhoids to shrink them.  Shining a type of light  energy onto the hemorrhoids to cause them to fall off.  Doing surgery to remove the hemorrhoids or cut off their blood supply. Follow these instructions at home: Eating and drinking   Eat foods that have a lot of fiber in them. These include whole grains, beans, nuts, fruits, and vegetables.  Ask your doctor about taking products that have added fiber (fibersupplements).  Reduce the amount of fat in your diet. You can do this by: ? Eating low-fat dairy products. ? Eating less red meat. ? Avoiding processed foods.  Drink enough fluid to keep your pee (urine) pale yellow. Managing pain and swelling   Take a warm-water bath (sitz bath) for 20 minutes to ease pain. Do this 3-4 times a day. You may do this in a bathtub or using a portable sitz bath that fits over the toilet.  If told, put ice on the painful area. It may be helpful to use ice between your warm baths. ? Put ice in a plastic bag. ? Place a towel between your skin and the bag. ? Leave the ice on for 20 minutes, 2-3 times a day. General instructions  Take over-the-counter and prescription medicines only as told by your doctor. ? Medicated creams and medicines may be used as told.  Exercise often. Ask your doctor how much and what kind of exercise is best for you.  Go to the bathroom when you have the urge to poop. Do not wait.  Avoid pushing too hard when you poop.  Keep your   butt dry and clean. Use wet toilet paper or moist towelettes after pooping.  Do not sit on the toilet for a long time.  Keep all follow-up visits as told by your doctor. This is important. Contact a doctor if you:  Have pain and swelling that do not get better with treatment or medicine.  Have trouble pooping.  Cannot poop.  Have pain or swelling outside the area of the hemorrhoids. Get help right away if you have:  Bleeding that will not stop. Summary  Hemorrhoids are swollen veins in the butt or around the opening of the  butt.  They can cause pain, itching, or bleeding.  Eat foods that have a lot of fiber in them. These include whole grains, beans, nuts, fruits, and vegetables.  Take a warm-water bath (sitz bath) for 20 minutes to ease pain. Do this 3-4 times a day. This information is not intended to replace advice given to you by your health care provider. Make sure you discuss any questions you have with your health care provider. Document Revised: 07/25/2018 Document Reviewed: 12/06/2017 Elsevier Patient Education  2020 Elsevier Inc.  

## 2019-11-18 NOTE — Progress Notes (Signed)
Wt 225 lb (102.1 kg)   BMI 31.38 kg/m    Subjective:    Patient ID: David Wong, male    DOB: 1985-09-26, 34 y.o.   MRN: [id number]  HPI: David Wong is a 34 y.o. male  Chief Complaint  Patient presents with  . Paperwork    fo work, post surgery    . This visit was completed via MyChart due to the restrictions of the COVID-19 pandemic. All issues as above were discussed and addressed. Physical exam was done as above through visual confirmation on MyChart. If it was felt that the patient should be evaluated in the office, they were directed there. The patient verbally consented to this visit. . Location of the patient: home . Location of the provider: work . Those involved with this call:  . Provider: Marnee Guarneri, DNP . CMA: Lesle Chris, [place] . Front Desk/Registration: Don Perking  . Time spent on call: 15 minutes with patient face to face via video conference. More than 50% of this time was spent in counseling and coordination of care. 10 minutes total spent in review of patient's record and preparation of their chart.  . I verified patient identity using two factors (patient name and date of birth). Patient consents verbally to being seen via telemedicine visit today.    PAPERWORK NEED:  Had hemorrhoid surgery on 11/17/2019 by Dr. Christian Mate.  He reports some discomfort post surgery.  He needs FMLA papers, he was told not to drive for one week.  He is considering being out for a few weeks for recovery.  Is having come pain at this time, but has been resting.  Is aware to follow-up with general surgery if any post surgical concerns.  Relevant past medical, surgical, family and social history reviewed and updated as indicated. Interim medical history since our last visit reviewed. Allergies and medications reviewed and updated.  Review of Systems  Constitutional: Negative for activity change, appetite change, diaphoresis, fatigue, fever and unexpected weight  change.  Respiratory: Negative.   Cardiovascular: Negative.   Gastrointestinal: Negative for abdominal distention, abdominal pain, constipation, diarrhea, nausea, rectal pain and vomiting.    Per HPI unless specifically indicated above     Objective:    Wt 225 lb (102.1 kg)   BMI 31.38 kg/m   Wt Readings from Last 3 Encounters:  11/18/19 225 lb (102.1 kg)  11/04/19 225 lb 6.4 oz (102.2 kg)  10/23/19 228 lb (103.4 kg)    Physical Exam Vitals and nursing note reviewed.  Constitutional:      General: He is awake. He is not in acute distress.    Appearance: He is well-developed. He is not ill-appearing.  HENT:     Head: Normocephalic.     Right Ear: Hearing normal. No drainage.     Left Ear: Hearing normal. No drainage.  Eyes:     General: Lids are normal.        Right eye: No discharge.        Left eye: No discharge.     Conjunctiva/sclera: Conjunctivae normal.  Pulmonary:     Effort: Pulmonary effort is normal. No accessory muscle usage or respiratory distress.  Musculoskeletal:     Cervical back: Normal range of motion.  Neurological:     Mental Status: He is alert and oriented to person, place, and time.  Psychiatric:        Mood and Affect: Mood normal.        Behavior: Behavior  normal. Behavior is cooperative.        Thought Content: Thought content normal.        Judgment: Judgment normal.     Results for orders placed or performed during the hospital encounter of 11/13/19  SARS CORONAVIRUS 2 (TAT 6-24 HRS) Nasopharyngeal Nasopharyngeal Swab   Specimen: Nasopharyngeal Swab  Result Value Ref Range   SARS Coronavirus 2 NEGATIVE NEGATIVE      Assessment & Plan:   Problem List Items Addressed This Visit    None    Visit Diagnoses    Encounter for completion of form with patient    -  Primary   FMLA papers filled out for patient workplaced due to recent surgical procedure.  Will return to office in 2 months for anemia follow-up.      I discussed the  assessment and treatment plan with the patient. The patient was provided an opportunity to ask questions and all were answered. The patient agreed with the plan and demonstrated an understanding of the instructions.   The patient was advised to call back or seek an in-person evaluation if the symptoms worsen or if the condition fails to improve as anticipated.   I provided 15+ minutes of time during this encounter.  Follow up plan: Return in about 2 months (around 01/18/2020) for Anemia.

## 2019-11-19 ENCOUNTER — Telehealth: Payer: Self-pay

## 2019-11-19 ENCOUNTER — Telehealth: Payer: Self-pay | Admitting: *Deleted

## 2019-11-19 NOTE — Telephone Encounter (Signed)
Called patient. Pt states he is able to urinate but has not had a BM since sx and has not took any OTC medications to help with this. Advised pt to start taking Miralax and drinking plenty of water to help his bowels move. Pt advised that he will be in pain due to the type of sx he had and pt understood that. Advised pt to take sitz baths and do cold compresses to his bottom to help relieve pain. Advised pt if the above is not working to call the office back. If needed to we could move his post op appt sooner. Pt voiced understanding and has no further concerns.

## 2019-11-19 NOTE — Telephone Encounter (Signed)
Patient called and stated that he has not had a BM or been able to Urinate since the surgery. He had surgery on 11/17/19 hemorrhoidectomy by Dr Christian Mate. He also stated that he has not been able to pass gas. He is having pain the rectum area as well. Please call and advise

## 2019-11-19 NOTE — Telephone Encounter (Signed)
Called pt, provider portion of form is complete. We can not fax because there are parts for pt completion. Pt stated he will have dad come by the office to collect paperwork.    Copied from [place] [id number]. Topic: General - Other >> Nov 19, 2019  4:09 PM Celene Kras wrote: Reason for CRM: Pt called for an update on his fmla paperwork. Pt had a Video Visit with PCP on 11/18/19. Please advise.

## 2019-11-20 ENCOUNTER — Telehealth: Payer: Self-pay | Admitting: *Deleted

## 2019-11-20 NOTE — Telephone Encounter (Signed)
Patient called and stated that he took Miralx yesterday and then again this morning and still has not had a bowel movement. Does he need to try something else? Please call and advise

## 2019-11-21 NOTE — Anesthesia Postprocedure Evaluation (Signed)
Anesthesia Post Note  Patient: David Wong  Procedure(s) Performed: HEMORRHOIDECTOMY Internal & External (N/A Rectum)  Patient location during evaluation: PACU Anesthesia Type: General Level of consciousness: awake and alert Pain management: pain level controlled Vital Signs Assessment: post-procedure vital signs reviewed and stable Respiratory status: spontaneous breathing, nonlabored ventilation, respiratory function stable and patient connected to nasal cannula oxygen Cardiovascular status: blood pressure returned to baseline and stable Postop Assessment: no apparent nausea or vomiting Anesthetic complications: no     Last Vitals:  Vitals:   11/17/19 1337 11/17/19 1345  BP: 123/75 133/85  Pulse: 88 93  Resp: 18 18  Temp: 36.6 C   SpO2: 98% 100%    Last Pain:  Vitals:   11/18/19 0832  TempSrc:   PainSc: [place]

## 2019-11-21 NOTE — Telephone Encounter (Signed)
Spoke with the patient and he states that he has had no relief with using the Miralax. Patient instructed he may use Magnesium Citrate liquid. He is to drink half of the bottle and give it 2-3 hours to work. If no results he should drink the rest of the bottle. If he still has no relief from this he is to contact our office. Patient instructed on rotating his pain medication for better relief. He expresses understanding and will call if he has any further problems.

## 2019-11-22 ENCOUNTER — Emergency Department (HOSPITAL_COMMUNITY): Payer: BC Managed Care – PPO

## 2019-11-22 ENCOUNTER — Encounter (HOSPITAL_COMMUNITY): Payer: Self-pay | Admitting: Emergency Medicine

## 2019-11-22 ENCOUNTER — Observation Stay (HOSPITAL_COMMUNITY)
Admission: EM | Admit: 2019-11-22 | Discharge: 2019-11-23 | Disposition: A | Discharge status: Home / Self Care | Payer: BC Managed Care – PPO | Attending: Internal Medicine | Admitting: Internal Medicine

## 2019-11-22 DIAGNOSIS — N3289 Other specified disorders of bladder: Secondary | ICD-10-CM | POA: Insufficient documentation

## 2019-11-22 DIAGNOSIS — Z8719 Personal history of other diseases of the digestive system: Secondary | ICD-10-CM | POA: Diagnosis not present

## 2019-11-22 DIAGNOSIS — Z8261 Family history of arthritis: Secondary | ICD-10-CM | POA: Insufficient documentation

## 2019-11-22 DIAGNOSIS — Z8249 Family history of ischemic heart disease and other diseases of the circulatory system: Secondary | ICD-10-CM | POA: Diagnosis not present

## 2019-11-22 DIAGNOSIS — Z03818 Encounter for observation for suspected exposure to other biological agents ruled out: Secondary | ICD-10-CM | POA: Diagnosis not present

## 2019-11-22 DIAGNOSIS — F419 Anxiety disorder, unspecified: Secondary | ICD-10-CM | POA: Diagnosis not present

## 2019-11-22 DIAGNOSIS — K219 Gastro-esophageal reflux disease without esophagitis: Secondary | ICD-10-CM | POA: Insufficient documentation

## 2019-11-22 DIAGNOSIS — Z20822 Contact with and (suspected) exposure to covid-19: Secondary | ICD-10-CM | POA: Insufficient documentation

## 2019-11-22 DIAGNOSIS — Y838 Other surgical procedures as the cause of abnormal reaction of the patient, or of later complication, without mention of misadventure at the time of the procedure: Secondary | ICD-10-CM | POA: Insufficient documentation

## 2019-11-22 DIAGNOSIS — R531 Weakness: Secondary | ICD-10-CM | POA: Diagnosis not present

## 2019-11-22 DIAGNOSIS — K760 Fatty (change of) liver, not elsewhere classified: Secondary | ICD-10-CM | POA: Insufficient documentation

## 2019-11-22 DIAGNOSIS — R109 Unspecified abdominal pain: Secondary | ICD-10-CM | POA: Diagnosis not present

## 2019-11-22 DIAGNOSIS — F17210 Nicotine dependence, cigarettes, uncomplicated: Secondary | ICD-10-CM | POA: Diagnosis not present

## 2019-11-22 DIAGNOSIS — F329 Major depressive disorder, single episode, unspecified: Secondary | ICD-10-CM | POA: Diagnosis not present

## 2019-11-22 DIAGNOSIS — G4700 Insomnia, unspecified: Secondary | ICD-10-CM | POA: Diagnosis not present

## 2019-11-22 DIAGNOSIS — R42 Dizziness and giddiness: Secondary | ICD-10-CM | POA: Insufficient documentation

## 2019-11-22 DIAGNOSIS — R9431 Abnormal electrocardiogram [ECG] [EKG]: Secondary | ICD-10-CM | POA: Insufficient documentation

## 2019-11-22 DIAGNOSIS — R339 Retention of urine, unspecified: Secondary | ICD-10-CM | POA: Insufficient documentation

## 2019-11-22 DIAGNOSIS — D509 Iron deficiency anemia, unspecified: Secondary | ICD-10-CM | POA: Insufficient documentation

## 2019-11-22 DIAGNOSIS — Z79899 Other long term (current) drug therapy: Secondary | ICD-10-CM | POA: Insufficient documentation

## 2019-11-22 DIAGNOSIS — Z791 Long term (current) use of non-steroidal anti-inflammatories (NSAID): Secondary | ICD-10-CM | POA: Insufficient documentation

## 2019-11-22 DIAGNOSIS — Z9889 Other specified postprocedural states: Secondary | ICD-10-CM | POA: Insufficient documentation

## 2019-11-22 DIAGNOSIS — K91840 Postprocedural hemorrhage and hematoma of a digestive system organ or structure following a digestive system procedure: Principal | ICD-10-CM | POA: Insufficient documentation

## 2019-11-22 DIAGNOSIS — K6289 Other specified diseases of anus and rectum: Secondary | ICD-10-CM | POA: Diagnosis not present

## 2019-11-22 DIAGNOSIS — R338 Other retention of urine: Secondary | ICD-10-CM | POA: Diagnosis present

## 2019-11-22 DIAGNOSIS — G4730 Sleep apnea, unspecified: Secondary | ICD-10-CM | POA: Insufficient documentation

## 2019-11-22 DIAGNOSIS — K922 Gastrointestinal hemorrhage, unspecified: Secondary | ICD-10-CM | POA: Diagnosis not present

## 2019-11-22 DIAGNOSIS — Z833 Family history of diabetes mellitus: Secondary | ICD-10-CM | POA: Insufficient documentation

## 2019-11-22 DIAGNOSIS — K5900 Constipation, unspecified: Secondary | ICD-10-CM | POA: Diagnosis present

## 2019-11-22 DIAGNOSIS — K4090 Unilateral inguinal hernia, without obstruction or gangrene, not specified as recurrent: Secondary | ICD-10-CM | POA: Diagnosis not present

## 2019-11-22 DIAGNOSIS — R4182 Altered mental status, unspecified: Secondary | ICD-10-CM | POA: Diagnosis not present

## 2019-11-22 DIAGNOSIS — Z8349 Family history of other endocrine, nutritional and metabolic diseases: Secondary | ICD-10-CM | POA: Insufficient documentation

## 2019-11-22 LAB — URINALYSIS, ROUTINE W REFLEX MICROSCOPIC
Bilirubin Urine: NEGATIVE
Glucose, UA: NEGATIVE mg/dL
Hgb urine dipstick: NEGATIVE
Ketones, ur: NEGATIVE mg/dL
Leukocytes,Ua: NEGATIVE
Nitrite: NEGATIVE
Protein, ur: NEGATIVE mg/dL
Specific Gravity, Urine: 1.019 (ref 1.005–1.030)
pH: 5 (ref 5.0–8.0)

## 2019-11-22 LAB — COMPREHENSIVE METABOLIC PANEL
ALT: 41 U/L (ref 0–44)
AST: 27 U/L (ref 15–41)
Albumin: 4.1 g/dL (ref 3.5–5.0)
Alkaline Phosphatase: 66 U/L (ref 38–126)
Anion gap: 12 (ref 5–15)
BUN: 14 mg/dL (ref 6–20)
CO2: 20 mmol/L — ABNORMAL LOW (ref 22–32)
Calcium: 9.2 mg/dL (ref 8.9–10.3)
Chloride: 106 mmol/L (ref 98–111)
Creatinine, Ser: 1.15 mg/dL (ref 0.61–1.24)
GFR calc Af Amer: >60 mL/min (ref >60)
GFR calc non Af Amer: >60 mL/min (ref >60)
Glucose, Bld: 107 mg/dL — ABNORMAL HIGH (ref 70–99)
Potassium: 4 mmol/L (ref 3.5–5.1)
Sodium: 138 mmol/L (ref 135–145)
Total Bilirubin: 0.6 mg/dL (ref 0.3–1.2)
Total Protein: 8.1 g/dL (ref 6.5–8.1)

## 2019-11-22 LAB — CBC
HCT: 39.3 % (ref 39.0–52.0)
Hemoglobin: 11.1 g/dL — ABNORMAL LOW (ref 13.0–17.0)
MCH: 21.6 pg — ABNORMAL LOW (ref 26.0–34.0)
MCHC: 28.2 g/dL — ABNORMAL LOW (ref 30.0–36.0)
MCV: 76.6 fL — ABNORMAL LOW (ref 80.0–100.0)
Platelets: 300 10*3/uL (ref 150–400)
RBC: 5.13 MIL/uL (ref 4.22–5.81)
RDW: 17 % — ABNORMAL HIGH (ref 11.5–15.5)
WBC: 12.3 10*3/uL — ABNORMAL HIGH (ref 4.0–10.5)
nRBC: 0 % (ref 0.0–0.2)

## 2019-11-22 LAB — POC OCCULT BLOOD, ED: Fecal Occult Bld: POSITIVE — AB

## 2019-11-22 MED ORDER — MORPHINE SULFATE (PF) 2 MG/ML IV SOLN: 2.0000 mg | Freq: Once | INTRAVENOUS | Status: DC

## 2019-11-22 MED ORDER — SODIUM CHLORIDE 0.9 % IV BOLUS
1000.0000 mL | Freq: Once | INTRAVENOUS | Status: AC
  Administered 2019-11-22: 21:00:00 1000 mL via INTRAVENOUS

## 2019-11-22 MED ORDER — OXYCODONE-ACETAMINOPHEN 5-325 MG PO TABS
1.0000 | ORAL_TABLET | Freq: Once | ORAL | Status: AC
  Administered 2019-11-22: 1 via ORAL
  Filled 2019-11-22: qty 1

## 2019-11-22 MED ORDER — IOHEXOL 300 MG/ML  SOLN
100.0000 mL | Freq: Once | INTRAMUSCULAR | Status: AC | PRN
  Administered 2019-11-22: 100 mL via INTRAVENOUS

## 2019-11-22 MED ORDER — MORPHINE SULFATE (PF) 4 MG/ML IV SOLN
4.0000 mg | Freq: Once | INTRAVENOUS | Status: AC
  Administered 2019-11-22: 4 mg via INTRAVENOUS
  Filled 2019-11-22: qty 1

## 2019-11-22 NOTE — ED Notes (Signed)
Pt diaphoretic, pale, liter of fluids started in triage

## 2019-11-22 NOTE — ED Provider Notes (Signed)
[place] EMERGENCY DEPARTMENT Provider Note   CSN: [id number] Arrival date & time: 11/22/19  1742     History Chief Complaint  Patient presents with  . Post-op Problem    David Wong is a 34 y.o. male.  Patient is a otherwise healthy 33 year old male coming in for GI bleed and inability to urinate.  States he had a hemorrhoidectomy on Monday with Dr. Lutricia Feil and since then he has been unable to urinate.  States he can only go maybe once a day.  This can only occur if he is in a very hot shower.  Also reports no bowel movement since hemorrhoidectomy.  Has tried MiraLAX as well as mag sulfate without improvement.  Today when he was at home he thought he had incontinence with a large bowel movement but then noticed that it was actually a large gush of blood.  Patient then immediately went to the emergency room.  In triage she states he felt very warm and dizzy and then he does not remember anything else until he got to the room.        Past Medical History:  Diagnosis Date  . Anemia   . Anxiety   . Depression   . GERD (gastroesophageal reflux disease)    occ   . Sleep apnea    does not have cpap    Patient Active Problem List   Diagnosis Date Noted  . Hemorrhoids   . Iron deficiency anemia 09/04/2017  . Depression, major, single episode, in partial remission ([place]) 06/29/2017  . Sleep apnea 06/29/2017  . Anxiety 01/08/2015  . Insomnia 01/08/2015    Past Surgical History:  Procedure Laterality Date  . COLONOSCOPY WITH PROPOFOL N/A 10/08/2018   Procedure: COLONOSCOPY WITH PROPOFOL;  Surgeon: Lin Landsman, MD;  Location: [facility] ENDOSCOPY;  Service: Gastroenterology;  Laterality: N/A;  . ESOPHAGOGASTRODUODENOSCOPY (EGD) WITH PROPOFOL N/A 10/08/2018   Procedure: ESOPHAGOGASTRODUODENOSCOPY (EGD) WITH PROPOFOL;  Surgeon: Lin Landsman, MD;  Location: [facility] ENDOSCOPY;  Service: Gastroenterology;  Laterality: N/A;  . HEMORRHOID SURGERY N/A  11/17/2019   Procedure: [place] Internal & External;  Surgeon: Ronny Bacon, MD;  Location: [facility] ORS;  Service: General;  Laterality: N/A;  . right pinky finger  2013   pins inserted       Family History  Problem Relation Age of Onset  . Hyperlipidemia Mother   . Hypertension Mother   . Hyperlipidemia Father   . Diabetes Father   . Hypertension Brother   . Arthritis Maternal Grandfather        RA  . Heart disease Paternal Grandfather     Social History   Tobacco Use  . Smoking status: Current Every Day Smoker    Years: 5.00    Types: E-cigarettes  . Smokeless tobacco: Never Used  Substance Use Topics  . Alcohol use: Yes    Alcohol/week: 0.0 standard drinks    Comment: Rarely  . Drug use: No    Home Medications Prior to Admission medications   Medication Sig Start Date End Date Taking? Authorizing Provider  calcium carbonate (TUMS - DOSED IN MG ELEMENTAL CALCIUM) 500 MG chewable tablet Chew 1 tablet by mouth as needed for indigestion or heartburn.    [provider]  citalopram (CELEXA) 20 MG tablet Take 1 tablet (20 mg total) by mouth daily. Patient taking differently: Take 20 mg by mouth every morning.  09/01/19   Cannady, Henrine Screws T, NP  guaiFENesin (MUCINEX) 600 MG 12 hr tablet  Take 600 mg by mouth 2 (two) times daily as needed for cough.    [provider]  HYDROcodone-acetaminophen (NORCO/VICODIN) 5-325 MG tablet Take 1 tablet by mouth every 6 (six) hours as needed for moderate pain. 11/17/19   Ronny Bacon, MD  ibuprofen (ADVIL) 200 MG tablet Take 400 mg by mouth every 6 (six) hours as needed for moderate pain.    [provider]  Multiple Vitamins-Iron (MULTIVITAMIN/IRON PO) Take 1 tablet by mouth daily.    [provider]    Allergies    Patient has no known allergies.  Review of Systems   Review of Systems  Gastrointestinal: Positive for anal bleeding and rectal pain.  Genitourinary: Positive for decreased  urine volume and difficulty urinating. Negative for hematuria.    Physical Exam Updated Vital Signs BP 131/78   Pulse 81   Temp 98.9 F (37.2 C)   Resp 20   Ht 5\' 10"  (1.778 m)   Wt 102 kg   SpO2 99%   BMI 32.27 kg/m   Physical Exam Constitutional:      Appearance: Normal appearance.  HENT:     Head: Normocephalic.     Nose: Nose normal.     Mouth/Throat:     Mouth: Mucous membranes are moist.     Pharynx: Oropharynx is clear.  Eyes:     Conjunctiva/sclera: Conjunctivae normal.     Pupils: Pupils are equal, round, and reactive to light.  Cardiovascular:     Rate and Rhythm: Normal rate and regular rhythm.     Heart sounds: No murmur. No friction rub. No gallop.   Pulmonary:     Effort: Pulmonary effort is normal.     Breath sounds: Normal breath sounds. No wheezing, rhonchi or rales.  Abdominal:     General: Bowel sounds are normal. There is distension.     Tenderness: There is abdominal tenderness. There is guarding.  Musculoskeletal:        General: No swelling or tenderness. Normal range of motion.     Cervical back: Normal range of motion.  Skin:    General: Skin is warm.  Neurological:     General: No focal deficit present.     Mental Status: He is alert. Mental status is at baseline.  Psychiatric:        Mood and Affect: Mood normal.     ED Results / Procedures / Treatments   Labs (all labs ordered are listed, but only abnormal results are displayed) Labs Reviewed  COMPREHENSIVE METABOLIC PANEL - Abnormal; Notable for the following components:      Result Value   CO2 20 (*)    Glucose, Bld 107 (*)    All other components within normal limits  CBC - Abnormal; Notable for the following components:   WBC 12.3 (*)    Hemoglobin 11.1 (*)    MCV 76.6 (*)    MCH 21.6 (*)    MCHC 28.2 (*)    RDW 17.0 (*)    All other components within normal limits  POC OCCULT BLOOD, ED - Abnormal; Notable for the following components:   Fecal Occult Bld POSITIVE (*)     All other components within normal limits  URINE CULTURE  URINALYSIS, ROUTINE W REFLEX MICROSCOPIC  TYPE AND SCREEN    EKG None  Radiology CT ABDOMEN PELVIS W CONTRAST  Result Date: 11/22/2019 CLINICAL DATA:  Abdominal pain and fever, status post 5 days postop hemorrhoidectomy. EXAM: CT ABDOMEN AND PELVIS WITH  CONTRAST TECHNIQUE: Multidetector CT imaging of the abdomen and pelvis was performed using the standard protocol following bolus administration of intravenous contrast. CONTRAST:  132mL OMNIPAQUE IOHEXOL 300 MG/ML  SOLN COMPARISON:  October 19, 2010 FINDINGS: Lower chest: No acute abnormality. Hepatobiliary: There is diffuse fatty infiltration of the liver parenchyma. No focal liver abnormality is seen. No gallstones, gallbladder wall thickening, or biliary dilatation. Pancreas: Unremarkable. No pancreatic ductal dilatation or surrounding inflammatory changes. Spleen: Normal in size without focal abnormality. Adrenals/Urinary Tract: Adrenal glands are unremarkable. Kidneys are normal, without renal calculi, focal lesion, or hydronephrosis. A Foley catheter is seen within the urinary bladder. Stomach/Bowel: Stomach is within normal limits. Appendix appears normal. No evidence of bowel wall thickening, distention, or inflammatory changes. Vascular/Lymphatic: No significant vascular findings are present. No enlarged abdominal or pelvic lymph nodes. Reproductive: Prostate is unremarkable. Other: There is a 3.0 cm x 1.8 cm fat containing left inguinal hernia. Musculoskeletal: No acute or significant osseous findings. IMPRESSION: 1. No acute process in the abdomen or pelvis. 2. Hepatic steatosis. 3. Fat-containing left inguinal hernia. Electronically Signed   By: Virgina Norfolk M.D.   On: 11/22/2019 21:28    Procedures Procedures (including critical care time)  Medications Ordered in ED Medications  oxyCODONE-acetaminophen (PERCOCET/ROXICET) 5-325 MG per tablet 1 tablet (1 tablet Oral Given  11/22/19 1953)  iohexol (OMNIPAQUE) 300 MG/ML solution 100 mL (100 mLs Intravenous Contrast Given 11/22/19 2115)  sodium chloride 0.9 % bolus 1,000 mL (0 mLs Intravenous Stopped 11/22/19 2317)  morphine 4 MG/ML injection 4 mg (4 mg Intravenous Given 11/22/19 2135)    ED Course  I have reviewed the triage vital signs and the nursing notes.  Pertinent labs & imaging results that were available during my care of the patient were reviewed by me and considered in my medical decision making (see chart for details).    MDM Rules/Calculators/A&P                      Patient is otherwise healthy 34 year old male coming in for GI bleed as well as urinary retention.  For GI bleed we will get CBC, CMP, type and screen.  We will also give 1 L fluid bolus given some borderline low blood pressure.  Will place order for 2 large bore IVs.  Patient is is currently refusing rectal exam due to pain in abdomen from bladder distention.  He is willing to have rectal exam after he gets a Foley catheter.  For urinary retention we will plan to place Foley catheter.  Will also obtain UA as well as culture.  Unclear etiology of urinary obstruction at this time.  CT abdomen pending for GI bleed which will also help evaluate urinary retention.  21:57 Patient just received morphine.  Allow waiting rectal exam at this time.  When patient turned over there was blood on not from rectal bleeding.  Fecal occult card obtained from blood on the outer surface of rectum.  Too much pain to tolerate full rectal exam.  Patient's blood pressure does seem improved now.  Heart rate is also stabilizing after fluids.  I anticipate patient will likely need admission for monitoring as he is having this acute GI bleed as well as urinary retention.  Patient reports improved abdominal pain since having Foley placed.  Abdomen is more soft as well.  23:07 Given frank blood, will page gen surg as patient had recent surgery on Monday. Paged, [facility] on  call. Awaiting callback  23:29 Spoke  to Dr. Redmond Pulling, general surgery.  Per Dr. Redmond Pulling bleeding can be normal after hemorrhoidectomy.  Also stated that his urinary retention will likely need a Foley x2 days and follow-up with urology on Tuesday.  States that patient needs to continue to use a stool softener and have aggressive bowel regimen.  Recommended milk of magnesia and sitz bath as well.  Also recommended avoiding opioids and have scheduled Tylenol and ibuprofen.  Recommended either admission to medicine where surgery will follow along in the morning versus transfer to [place] for admission given that his surgeon is at [facility].  Did state that they do not plan on doing surgery at this time so location is not as important.  Plan to admit to medicine at [facility] and have surgery follow along.  Medicine should also consult urology in the a.m. have paged for unassigned admission, awaiting callback.  23:32 Patient is again having retention and his Foley is no longer draining.  Checked Foley with Dr. Reather Converse.  Will need to flush and see if we can get some further drainage.  May need to place new Foley if no further drainage.  Discussed this with patient.  23:39 Irrigation was unsuccessful. Will place new foley.   24:08 Spoke to Dr. Humphrey Rolls, hospitalist on call. Has agreed to admission.  Discussed patient with Dr. Reather Converse who also saw patient   Final Clinical Impression(s) / ED Diagnoses Final diagnoses:  Gastrointestinal hemorrhage, unspecified gastrointestinal hemorrhage type  Urinary retention    Rx / DC Orders ED Discharge Orders    None       Caroline More, DO 11/23/19 0009    Elnora Morrison, MD 11/23/19 1511

## 2019-11-22 NOTE — ED Triage Notes (Signed)
Pt 5 days post op hemmorrhoidectomy. Pt reports today gush of bright red blood, pt reports no BM since before surgery Monday, no urine output since 11am today. Pt c/o severe pain to lower abdomen and rectum.

## 2019-11-22 NOTE — ED Notes (Signed)
Pt's bladder scan >700

## 2019-11-22 NOTE — ED Notes (Addendum)
Catheter was irrigated without any return of urine. Will try again, if not d/c per MD orders.

## 2019-11-23 DIAGNOSIS — K922 Gastrointestinal hemorrhage, unspecified: Secondary | ICD-10-CM | POA: Diagnosis present

## 2019-11-23 DIAGNOSIS — R338 Other retention of urine: Secondary | ICD-10-CM | POA: Diagnosis present

## 2019-11-23 DIAGNOSIS — K5900 Constipation, unspecified: Secondary | ICD-10-CM | POA: Diagnosis present

## 2019-11-23 LAB — COMPREHENSIVE METABOLIC PANEL
ALT: 33 U/L (ref 0–44)
AST: 22 U/L (ref 15–41)
Albumin: 3.4 g/dL — ABNORMAL LOW (ref 3.5–5.0)
Alkaline Phosphatase: 57 U/L (ref 38–126)
Anion gap: 8 (ref 5–15)
BUN: 13 mg/dL (ref 6–20)
CO2: 22 mmol/L (ref 22–32)
Calcium: 8.6 mg/dL — ABNORMAL LOW (ref 8.9–10.3)
Chloride: 107 mmol/L (ref 98–111)
Creatinine, Ser: 1 mg/dL (ref 0.61–1.24)
GFR calc Af Amer: >60 mL/min (ref >60)
GFR calc non Af Amer: >60 mL/min (ref >60)
Glucose, Bld: 99 mg/dL (ref 70–99)
Potassium: 4.1 mmol/L (ref 3.5–5.1)
Sodium: 137 mmol/L (ref 135–145)
Total Bilirubin: 0.6 mg/dL (ref 0.3–1.2)
Total Protein: 6.3 g/dL — ABNORMAL LOW (ref 6.5–8.1)

## 2019-11-23 LAB — CBC
HCT: 32.7 % — ABNORMAL LOW (ref 39.0–52.0)
Hemoglobin: 9.3 g/dL — ABNORMAL LOW (ref 13.0–17.0)
MCH: 21.7 pg — ABNORMAL LOW (ref 26.0–34.0)
MCHC: 28.4 g/dL — ABNORMAL LOW (ref 30.0–36.0)
MCV: 76.4 fL — ABNORMAL LOW (ref 80.0–100.0)
Platelets: 245 10*3/uL (ref 150–400)
RBC: 4.28 MIL/uL (ref 4.22–5.81)
RDW: 17.1 % — ABNORMAL HIGH (ref 11.5–15.5)
WBC: 11 10*3/uL — ABNORMAL HIGH (ref 4.0–10.5)
nRBC: 0 % (ref 0.0–0.2)

## 2019-11-23 LAB — RESPIRATORY PANEL BY RT PCR (FLU A&B, COVID)
Influenza A by PCR: NEGATIVE
Influenza B by PCR: NEGATIVE
SARS Coronavirus 2 by RT PCR: NEGATIVE

## 2019-11-23 LAB — TYPE AND SCREEN
ABO/RH(D): O POS
Antibody Screen: NEGATIVE

## 2019-11-23 LAB — HIV ANTIBODY (ROUTINE TESTING W REFLEX): HIV Screen 4th Generation wRfx: NONREACTIVE

## 2019-11-23 LAB — HEMOGLOBIN AND HEMATOCRIT, BLOOD
HCT: 31.5 % — ABNORMAL LOW (ref 39.0–52.0)
Hemoglobin: 9 g/dL — ABNORMAL LOW (ref 13.0–17.0)

## 2019-11-23 MED ORDER — ONDANSETRON HCL 4 MG/2ML IJ SOLN: 4.0000 mg | Freq: Four times a day (QID) | INTRAMUSCULAR | Status: DC | PRN

## 2019-11-23 MED ORDER — SODIUM CHLORIDE 0.9 % IV SOLN: INTRAVENOUS | Status: DC

## 2019-11-23 MED ORDER — ACETAMINOPHEN 325 MG PO TABS: 650.0000 mg | ORAL_TABLET | Freq: Four times a day (QID) | ORAL | Status: DC | PRN

## 2019-11-23 MED ORDER — ACETAMINOPHEN 650 MG RE SUPP: 650.0000 mg | Freq: Four times a day (QID) | RECTAL | Status: DC | PRN

## 2019-11-23 MED ORDER — SENNOSIDES-DOCUSATE SODIUM 8.6-50 MG PO TABS
2.0000 | ORAL_TABLET | Freq: Two times a day (BID) | ORAL | 1 refills | Status: AC
Start: 2019-11-23 — End: 2019-12-03

## 2019-11-23 MED ORDER — ONDANSETRON HCL 4 MG PO TABS: 4.0000 mg | ORAL_TABLET | Freq: Four times a day (QID) | ORAL | Status: DC | PRN

## 2019-11-23 NOTE — Progress Notes (Signed)
Received pt alert and oriented x . Pt with foley catheter in place. Pt with assistance to bed with moderate pain to bottom with movement. Oriented pt to plan of care and use of call light. Will monitor pt.

## 2019-11-23 NOTE — H&P (Signed)
History and Physical    CORNELIOUS HOLLOBAUGH [id number] DOB: 03-Jul-1986 DOA: 11/22/2019  PCP: Venita Lick, NP (Confirm with patient/family/NH records and if not entered, this has to be entered at [facility] point of entry) Patient coming from: Home  I have personally briefly reviewed patient's old medical records in [place]  Chief Complaint: GI bleed urinary retention  HPI: David Wong is a 34 y.o. male with medical history significant of anemia, anxiety, depression, hemorrhoids s/p hemorrhoidectomy presented to ED for evaluation of difficulty with urination and rectal bleeding.  Patient mentioned that he had hemorrhoidectomy 5 days ago and since then he is having difficulty with urination and hardly go maybe once a day.  Patient further mentioned that he did not have any bowel movements since his surgery despite of continuously trying MiraLAX and magnesium sulfate.  Patient further reported that today he had a large gush of blood from his rectum and he started feeling very weak and dizzy therefore he reported to the ED for further evaluation.  Patient is also complaining of mild abdominal and rectal pain but denies fever, chills, chest pain, shortness of breath, nausea, vomiting and anxiety.  ED Course: On arrival to the ED patient had temperature of 98.9, blood pressure 119/79, heart rate 114, respiratory rate 22.  Blood work showed WBC 12.3, hemoglobin 11.1, BUN 14, creatinine 1.5, blood glucose 107.  UA negative CT abdomen/pelvis was negative for acute intra-abdominal or intrapelvic pathology.  Fecal occult blood positive.  Was given Norco and morphine in the ED and also given IV fluids.  Foley catheter is placed for urinary retention in the ED.  ED physician also contacted general surgery/Dr. Redmond Pulling who recommended that the patient should continue using stool softener and also recommended milk of magnesia and sitz bath.  General surgery also recommended avoiding opioids and and also  recommended admission to the medicine and patient will be seen by surgery in the morning.  Dr. Chrisandra Netters surgery also recommended to follow-up with urology on Tuesday.  Review of Systems: As per HPI otherwise 10 point review of systems negative.  Unacceptable ROS statements: "10 systems reviewed," "Extensive" (without elaboration).  Acceptable ROS statements: "All others negative," "All others reviewed and are negative," and "All others unremarkable," with at Rew documented Can't double dip - if using for HPI can't use for ROS  Past Medical History:  Diagnosis Date  . Anemia   . Anxiety   . Depression   . GERD (gastroesophageal reflux disease)    occ   . Sleep apnea    does not have cpap    Past Surgical History:  Procedure Laterality Date  . COLONOSCOPY WITH PROPOFOL N/A 10/08/2018   Procedure: COLONOSCOPY WITH PROPOFOL;  Surgeon: Lin Landsman, MD;  Location: [facility] ENDOSCOPY;  Service: Gastroenterology;  Laterality: N/A;  . ESOPHAGOGASTRODUODENOSCOPY (EGD) WITH PROPOFOL N/A 10/08/2018   Procedure: ESOPHAGOGASTRODUODENOSCOPY (EGD) WITH PROPOFOL;  Surgeon: Lin Landsman, MD;  Location: [facility] ENDOSCOPY;  Service: Gastroenterology;  Laterality: N/A;  . HEMORRHOID SURGERY N/A 11/17/2019   Procedure: [place] Internal & External;  Surgeon: Ronny Bacon, MD;  Location: [facility] ORS;  Service: General;  Laterality: N/A;  . right pinky finger  2013   pins inserted     reports that he has been smoking e-cigarettes. He has smoked for the past 5.00 years. He has never used smokeless tobacco. He reports current alcohol use. He reports that he does not use drugs.  No Known Allergies  Family  History  Problem Relation Age of Onset  . Hyperlipidemia Mother   . Hypertension Mother   . Hyperlipidemia Father   . Diabetes Father   . Hypertension Brother   . Arthritis Maternal Grandfather        RA  . Heart disease Paternal Grandfather      Prior to Admission  medications   Medication Sig Start Date End Date Taking? Authorizing Provider  calcium carbonate (TUMS - DOSED IN MG ELEMENTAL CALCIUM) 500 MG chewable tablet Chew 1 tablet by mouth as needed for indigestion or heartburn.    [provider]  citalopram (CELEXA) 20 MG tablet Take 1 tablet (20 mg total) by mouth daily. Patient taking differently: Take 20 mg by mouth every morning.  09/01/19   Cannady, Henrine Screws T, NP  guaiFENesin (MUCINEX) 600 MG 12 hr tablet Take 600 mg by mouth 2 (two) times daily as needed for cough.    [provider]  HYDROcodone-acetaminophen (NORCO/VICODIN) 5-325 MG tablet Take 1 tablet by mouth every 6 (six) hours as needed for moderate pain. 11/17/19   Ronny Bacon, MD  ibuprofen (ADVIL) 200 MG tablet Take 400 mg by mouth every 6 (six) hours as needed for moderate pain.    [provider]  Multiple Vitamins-Iron (MULTIVITAMIN/IRON PO) Take 1 tablet by mouth daily.    [provider]    Physical Exam: Vitals:   11/23/19 0015 11/23/19 0100 11/23/19 0128 11/23/19 0510  BP: 121/74 115/79 134/84 116/73  Pulse: 87 72 72 71  Resp: 14 18 18 16   Temp:   98.8 F (37.1 C) 98.5 F (36.9 C)  TempSrc:   Oral Oral  SpO2: 98% 98% 100% 98%  Weight:      Height:        Constitutional: NAD, calm, comfortable Vitals:   11/23/19 0015 11/23/19 0100 11/23/19 0128 11/23/19 0510  BP: 121/74 115/79 134/84 116/73  Pulse: 87 72 72 71  Resp: 14 18 18 16   Temp:   98.8 F (37.1 C) 98.5 F (36.9 C)  TempSrc:   Oral Oral  SpO2: 98% 98% 100% 98%  Weight:      Height:       Eyes: PERRL, lids and conjunctivae normal ENMT: Mucous membranes are moist. Posterior pharynx clear of any exudate or lesions.Normal dentition.  Neck: normal, supple, no masses, no thyromegaly Respiratory: clear to auscultation bilaterally, no wheezing, no crackles. Normal respiratory effort. No accessory muscle use.  Cardiovascular: Regular rate and rhythm, no murmurs / rubs /  gallops. No extremity edema. 2+ pedal pulses. No carotid bruits.  Abdomen: no tenderness, no masses palpated. No hepatosplenomegaly. Bowel sounds positive.  Musculoskeletal: no clubbing / cyanosis. No joint deformity upper and lower extremities. Good ROM, no contractures. Normal muscle tone.  Skin: no rashes, lesions, ulcers. No induration Neurologic: CN 2-12 grossly intact. Sensation intact, DTR normal. Strength 5/5 in all 4.  Psychiatric: Normal judgment and insight. Alert and oriented x 3. Normal mood.   (Anything < 9 systems with 2 bullets each down codes to level 1) (If patient refuses exam can't bill higher level) (Make sure to document decubitus ulcers present on admission -- if possible -- and whether patient has chronic indwelling catheter at time of admission)  Labs on Admission: I have personally reviewed following labs and imaging studies  CBC: Recent Labs  Lab 11/22/19 2001 11/23/19 0546  WBC 12.3* 11.0*  HGB 11.1* 9.3*  HCT 39.3 32.7*  MCV 76.6* 76.4*  PLT 300 245  Basic Metabolic Panel: Recent Labs  Lab 11/22/19 2001  NA 138  K 4.0  CL 106  CO2 20*  GLUCOSE 107*  BUN 14  CREATININE 1.15  CALCIUM 9.2   GFR: Estimated Creatinine Clearance: 110.3 mL/min (by C-G formula based on SCr of 1.15 mg/dL). Liver Function Tests: Recent Labs  Lab 11/22/19 2001  AST 27  ALT 41  ALKPHOS 66  BILITOT 0.6  PROT 8.1  ALBUMIN 4.1   No results for input(s): LIPASE, AMYLASE in the last 168 hours. No results for input(s): AMMONIA in the last 168 hours. Coagulation Profile: No results for input(s): INR, PROTIME in the last 168 hours. Cardiac Enzymes: No results for input(s): CKTOTAL, CKMB, CKMBINDEX, TROPONINI in the last 168 hours. BNP (last 3 results) No results for input(s): PROBNP in the last 8760 hours. HbA1C: No results for input(s): HGBA1C in the last 72 hours. CBG: No results for input(s): GLUCAP in the last 168 hours. Lipid Profile: No results for  input(s): CHOL, HDL, LDLCALC, TRIG, CHOLHDL, LDLDIRECT in the last 72 hours. Thyroid Function Tests: No results for input(s): TSH, T4TOTAL, FREET4, T3FREE, THYROIDAB in the last 72 hours. Anemia Panel: No results for input(s): VITAMINB12, FOLATE, FERRITIN, TIBC, IRON, RETICCTPCT in the last 72 hours. Urine analysis:    Component Value Date/Time   COLORURINE YELLOW 11/22/2019 2100   APPEARANCEUR CLEAR 11/22/2019 2100   LABSPEC 1.019 11/22/2019 2100   PHURINE 5.0 11/22/2019 2100   GLUCOSEU NEGATIVE 11/22/2019 2100   [place] NEGATIVE 11/22/2019 2100   [place] NEGATIVE 11/22/2019 2100   [place] NEGATIVE 11/22/2019 2100   PROTEINUR NEGATIVE 11/22/2019 2100   NITRITE NEGATIVE 11/22/2019 2100   LEUKOCYTESUR NEGATIVE 11/22/2019 2100    Radiological Exams on Admission: CT ABDOMEN PELVIS W CONTRAST  Result Date: 11/22/2019 CLINICAL DATA:  Abdominal pain and fever, status post 5 days postop hemorrhoidectomy. EXAM: CT ABDOMEN AND PELVIS WITH CONTRAST TECHNIQUE: Multidetector CT imaging of the abdomen and pelvis was performed using the standard protocol following bolus administration of intravenous contrast. CONTRAST:  172mL OMNIPAQUE IOHEXOL 300 MG/ML  SOLN COMPARISON:  October 19, 2010 FINDINGS: Lower chest: No acute abnormality. Hepatobiliary: There is diffuse fatty infiltration of the liver parenchyma. No focal liver abnormality is seen. No gallstones, gallbladder wall thickening, or biliary dilatation. Pancreas: Unremarkable. No pancreatic ductal dilatation or surrounding inflammatory changes. Spleen: Normal in size without focal abnormality. Adrenals/Urinary Tract: Adrenal glands are unremarkable. Kidneys are normal, without renal calculi, focal lesion, or hydronephrosis. A Foley catheter is seen within the urinary bladder. Stomach/Bowel: Stomach is within normal limits. Appendix appears normal. No evidence of bowel wall thickening, distention, or inflammatory changes. Vascular/Lymphatic: No  significant vascular findings are present. No enlarged abdominal or pelvic lymph nodes. Reproductive: Prostate is unremarkable. Other: There is a 3.0 cm x 1.8 cm fat containing left inguinal hernia. Musculoskeletal: No acute or significant osseous findings. IMPRESSION: 1. No acute process in the abdomen or pelvis. 2. Hepatic steatosis. 3. Fat-containing left inguinal hernia. Electronically Signed   By: Virgina Norfolk M.D.   On: 11/22/2019 21:28      Assessment/Plan Principal Problem:   Lower GI bleeding Patient will be follow-up by general surgery this morning. Continue to monitor CBC and consider blood transfusion if hemoglobin drop below 7. Continue stool softener and sitz bath. Avoid opioids for pain and use Tylenol and ibuprofen.  Active Problems:   Acute urinary retention Surgery recommended to keep the Foley catheter for 2 more days and follow-up with urology on Tuesday.  Constipation Continue laxatives and Sitz bath    DVT prophylaxis: No chemical prophylaxis because of active bleed.  SCDs ordered Code Status: Full Family Communication patient's father is present at the bedside   Disposition Plan:  Consults called: General surgery recommended by Dr. Redmond Pulling Admission status: OBSERVATION/medical telemetry   Edmonia Lynch MD [facility] Pager [phone]   If 7PM-7AM, please contact night-coverage [email or web address] Password   11/23/2019, 6:14 AM

## 2019-11-23 NOTE — ED Notes (Signed)
RN was able to to get foley catheter to work without having to remove previous foley. RN documented the foley removal however the new foley documentation is a repeat documentation.

## 2019-11-23 NOTE — Progress Notes (Signed)
Patient ID: David Wong, male   DOB: 21-Jun-1986, 34 y.o.   MRN: [id number] [facility] Progress Note:   * No surgery found *  Subjective: Mental status is clear, alert and appropriate.  Complaints anal/rectal pain. Objective: Vital signs in last 24 hours: Temp:  [98.5 F (36.9 C)-98.9 F (37.2 C)] 98.5 F (36.9 C) (04/25 0510) Pulse Rate:  [71-114] 71 (04/25 0510) Resp:  [14-23] 16 (04/25 0510) BP: (106-139)/(73-88) 116/73 (04/25 0510) SpO2:  [97 %-100 %] 98 % (04/25 0510) Weight:  [102 kg] 102 kg (04/24 1946)  Intake/Output from previous day: 04/24 0701 - 04/25 0700 In: 500 [I.V.:500] Out: 1050 [Urine:1050] Intake/Output this shift: No intake/output data recorded.  Physical Exam: Work of breathing is normal.  Foley in place.    Lab Results:  Results for orders placed or performed during the hospital encounter of 11/22/19 (from the past 48 hour(s))  Comprehensive metabolic panel     Status: Abnormal   Collection Time: 11/22/19  8:01 PM  Result Value Ref Range   Sodium 138 135 - 145 mmol/L   Potassium 4.0 3.5 - 5.1 mmol/L   Chloride 106 98 - 111 mmol/L   CO2 20 (L) 22 - 32 mmol/L   Glucose, Bld 107 (H) 70 - 99 mg/dL    Comment: Glucose reference range applies only to samples taken after fasting for at least 8 hours.   BUN 14 6 - 20 mg/dL   Creatinine, Ser 1.15 0.61 - 1.24 mg/dL   Calcium 9.2 8.9 - 10.3 mg/dL   Total Protein 8.1 6.5 - 8.1 g/dL   Albumin 4.1 3.5 - 5.0 g/dL   AST 27 15 - 41 U/L   ALT 41 0 - 44 U/L   Alkaline Phosphatase 66 38 - 126 U/L   Total Bilirubin 0.6 0.3 - 1.2 mg/dL   GFR calc non Af Amer >60 >60 mL/min   GFR calc Af Amer >60 >60 mL/min   Anion gap 12 5 - 15    Comment: Performed at [facility], [place] [address] [place], [place] [address]  CBC     Status: Abnormal   Collection Time: 11/22/19  8:01 PM  Result Value Ref Range   WBC 12.3 (H) 4.0 - 10.5 K/uL   RBC 5.13 4.22 - 5.81 MIL/uL   Hemoglobin 11.1 (L) 13.0 - 17.0 g/dL    HCT 39.3 39.0 - 52.0 %   MCV 76.6 (L) 80.0 - 100.0 fL   MCH 21.6 (L) 26.0 - 34.0 pg   MCHC 28.2 (L) 30.0 - 36.0 g/dL   RDW 17.0 (H) 11.5 - 15.5 %   Platelets 300 150 - 400 K/uL   nRBC 0.0 0.0 - 0.2 %    Comment: Performed at [facility] [address], [place], [place] [address]  Type and screen [place]     Status: None   Collection Time: 11/22/19  8:01 PM  Result Value Ref Range   ABO/RH(D) O POS    Antibody Screen NEG    Sample Expiration      11/25/2019,2359 Performed at [facility], [place] [address] [place],  [address]   Urinalysis, Routine w reflex microscopic     Status: None   Collection Time: 11/22/19  9:00 PM  Result Value Ref Range   Color, Urine YELLOW YELLOW   APPearance CLEAR CLEAR   Specific Gravity, Urine 1.019 1.005 - 1.030   pH 5.0 5.0 -  8.0   Glucose, UA NEGATIVE NEGATIVE mg/dL   Hgb urine dipstick NEGATIVE NEGATIVE   Bilirubin Urine NEGATIVE NEGATIVE   Ketones, ur NEGATIVE NEGATIVE mg/dL   Protein, ur NEGATIVE NEGATIVE mg/dL   Nitrite NEGATIVE NEGATIVE   Leukocytes,Ua NEGATIVE NEGATIVE    Comment: Performed at [facility] [address] [place], [place] [address]  POC occult blood, ED     Status: Abnormal   Collection Time: 11/22/19  9:57 PM  Result Value Ref Range   Fecal Occult Bld POSITIVE (A) NEGATIVE  Respiratory Panel by RT PCR (Flu A&B, Covid) - Nasopharyngeal Swab     Status: None   Collection Time: 11/23/19  1:08 AM   Specimen: Nasopharyngeal Swab  Result Value Ref Range   SARS Coronavirus 2 by RT PCR NEGATIVE NEGATIVE    Comment: (NOTE) SARS-CoV-2 target nucleic acids are NOT DETECTED. The SARS-CoV-2 RNA is generally detectable in upper respiratoy specimens during the acute phase of infection. The lowest concentration of SARS-CoV-2 viral copies this assay can detect is 131 copies/mL. A negative result does not preclude SARS-Cov-2 infection and should not be used as the sole basis for  treatment or other patient management decisions. A negative result may occur with  improper specimen collection/handling, submission of specimen other than nasopharyngeal swab, presence of viral mutation(s) within the areas targeted by this assay, and inadequate number of viral copies (<131 copies/mL). A negative result must be combined with clinical observations, patient history, and epidemiological information. The expected result is Negative. Fact Sheet for Patients:  PinkCheek.be Fact Sheet for Healthcare Providers:  GravelBags.it This test is not yet ap proved or cleared by the [place] FDA and  has been authorized for detection and/or diagnosis of SARS-CoV-2 by FDA under an Emergency Use Authorization (EUA). This EUA will remain  in effect (meaning this test can be used) for the duration of the COVID-19 declaration under Section 564(b)(1) of the Act, 21 U.S.C. section 360bbb-3(b)(1), unless the authorization is terminated or revoked sooner.    Influenza A by PCR NEGATIVE NEGATIVE   Influenza B by PCR NEGATIVE NEGATIVE    Comment: (NOTE) The Xpert Xpress SARS-CoV-2/FLU/RSV assay is intended as an aid in  the diagnosis of influenza from Nasopharyngeal swab specimens and  should not be used as a sole basis for treatment. Nasal washings and  aspirates are unacceptable for Xpert Xpress SARS-CoV-2/FLU/RSV  testing. Fact Sheet for Patients: PinkCheek.be Fact Sheet for Healthcare Providers: GravelBags.it This test is not yet approved or cleared by the [place] FDA and  has been authorized for detection and/or diagnosis of SARS-CoV-2 by  FDA under an Emergency Use Authorization (EUA). This EUA will remain  in effect (meaning this test can be used) for the duration of the  Covid-19 declaration under Section 564(b)(1) of the Act, 21  U.S.C. section  360bbb-3(b)(1), unless the authorization is  terminated or revoked. Performed at [facility], [place] [address] [place], [place] [address]   CBC     Status: Abnormal   Collection Time: 11/23/19  5:46 AM  Result Value Ref Range   WBC 11.0 (H) 4.0 - 10.5 K/uL   RBC 4.28 4.22 - 5.81 MIL/uL   Hemoglobin 9.3 (L) 13.0 - 17.0 g/dL   HCT 32.7 (L) 39.0 - 52.0 %   MCV 76.4 (L) 80.0 - 100.0 fL   MCH 21.7 (L) 26.0 - 34.0 pg   MCHC 28.4 (L) 30.0 - 36.0 g/dL   RDW 17.1 (H)  11.5 - 15.5 %   Platelets 245 150 - 400 K/uL   nRBC 0.0 0.0 - 0.2 %    Comment: Performed at [facility], [place] [address] [place], [place] [address]  Comprehensive metabolic panel     Status: Abnormal   Collection Time: 11/23/19  5:46 AM  Result Value Ref Range   Sodium 137 135 - 145 mmol/L   Potassium 4.1 3.5 - 5.1 mmol/L   Chloride 107 98 - 111 mmol/L   CO2 22 22 - 32 mmol/L   Glucose, Bld 99 70 - 99 mg/dL    Comment: Glucose reference range applies only to samples taken after fasting for at least 8 hours.   BUN 13 6 - 20 mg/dL   Creatinine, Ser 1.00 0.61 - 1.24 mg/dL   Calcium 8.6 (L) 8.9 - 10.3 mg/dL   Total Protein 6.3 (L) 6.5 - 8.1 g/dL   Albumin 3.4 (L) 3.5 - 5.0 g/dL   AST 22 15 - 41 U/L   ALT 33 0 - 44 U/L   Alkaline Phosphatase 57 38 - 126 U/L   Total Bilirubin 0.6 0.3 - 1.2 mg/dL   GFR calc non Af Amer >60 >60 mL/min   GFR calc Af Amer >60 >60 mL/min   Anion gap 8 5 - 15    Comment: Performed at [facility] [address] [place], [place] [address]    Radiology/Results: CT ABDOMEN PELVIS W CONTRAST  Result Date: 11/22/2019 CLINICAL DATA:  Abdominal pain and fever, status post 5 days postop hemorrhoidectomy. EXAM: CT ABDOMEN AND PELVIS WITH CONTRAST TECHNIQUE: Multidetector CT imaging of the abdomen and pelvis was performed using the standard protocol following bolus administration of intravenous contrast. CONTRAST:  138mL OMNIPAQUE IOHEXOL 300 MG/ML  SOLN COMPARISON:  October 19, 2010  FINDINGS: Lower chest: No acute abnormality. Hepatobiliary: There is diffuse fatty infiltration of the liver parenchyma. No focal liver abnormality is seen. No gallstones, gallbladder wall thickening, or biliary dilatation. Pancreas: Unremarkable. No pancreatic ductal dilatation or surrounding inflammatory changes. Spleen: Normal in size without focal abnormality. Adrenals/Urinary Tract: Adrenal glands are unremarkable. Kidneys are normal, without renal calculi, focal lesion, or hydronephrosis. A Foley catheter is seen within the urinary bladder. Stomach/Bowel: Stomach is within normal limits. Appendix appears normal. No evidence of bowel wall thickening, distention, or inflammatory changes. Vascular/Lymphatic: No significant vascular findings are present. No enlarged abdominal or pelvic lymph nodes. Reproductive: Prostate is unremarkable. Other: There is a 3.0 cm x 1.8 cm fat containing left inguinal hernia. Musculoskeletal: No acute or significant osseous findings. IMPRESSION: 1. No acute process in the abdomen or pelvis. 2. Hepatic steatosis. 3. Fat-containing left inguinal hernia. Electronically Signed   By: Virgina Norfolk M.D.   On: 11/22/2019 21:28    Anti-infectives: Anti-infectives (From admission, onward)   None      Assessment/Plan: Problem List: Patient Active Problem List   Diagnosis Date Noted  . Lower GI bleeding 11/23/2019  . Acute urinary retention 11/23/2019  . Constipation 11/23/2019  . Hemorrhoids   . Iron deficiency anemia 09/04/2017  . Depression, major, single episode, in partial remission ([place]) 06/29/2017  . Sleep apnea 06/29/2017  . Anxiety 01/08/2015  . Insomnia 01/08/2015    Postop course (surgery 4/19) consistent with pain onset at 3 days when Exparel wore off.  Can go home with Foley in place and follow up with Dr. Christian Mate and they can remove in the office.  Tub baths at home (preferred to Sitz baths)  in warm water to help relieve pelvic floor spasm.  Spasm  usually resolves spontaneously after 7-10 days postop.   * No surgery found *    LOS: 0 days   Matt B. Hassell Done, MD, [facility] [phone] to reach the surgeon on call.    11/23/2019 8:48 AM

## 2019-11-23 NOTE — Progress Notes (Signed)
Discharge AVS meds take and those due reviewed with pt. Follow up appointments and when to call MD reviewed. All questions and concerns addressed. No further questions at this time. Pt given instructions on how to care for indwelling urinary catheter. Pt given supplies to care for foley.  D/c IV. D/C home per orders.  Amanda Cockayne, RN

## 2019-11-23 NOTE — Discharge Summary (Signed)
Physician Discharge Summary  David Wong [id number] DOB: [birth year] DOA: 11/22/2019  PCP: Venita Lick, NP  Admit date: 11/22/2019 Discharge date: 11/23/2019  Admitted From: Home  Discharge disposition: Home   Recommendations for Outpatient Follow-Up:   . Follow up with your general surgery, Dr. Lutricia Feil in 3 to 5 days. . Check CBC, BMP in the next visit. . Continue Foley catheter until you see your general surgeon.  Discharge Diagnosis:   Principal Problem:   Lower GI bleeding Active Problems:   Acute urinary retention   Constipation   Discharge Condition: Improved.  Diet recommendation:  Regular.  Wound care: None.  Code status: Full.   History of Present Illness:   David Wong is a 34 y.o. male with medical history significant of anemia, anxiety, depression, hemorrhoids s/p hemorrhoidectomy presented to ED for evaluation of difficulty with urination and rectal bleeding.  Patient mentioned that he had hemorrhoidectomy 5 days ago and since then he is having difficulty with urination and hardly go maybe once a day.  Patient further mentioned that he did not have any bowel movements since his surgery despite of continuously trying MiraLAX and magnesium sulfate.  Patient further reported that today he had a large gush of blood from his rectum and he started feeling very weak and dizzy therefore he reported to the ED for further evaluation.    ED Course: On arrival to the ED patient had temperature of 98.9, blood pressure 119/79, heart rate 114, respiratory rate 22.  Blood work showed WBC 12.3, hemoglobin 11.1, BUN 14, creatinine 1.5, blood glucose 107.  UA negative CT abdomen/pelvis was negative for acute intra-abdominal or intrapelvic pathology.  Fecal occult blood positive.  Was given Norco and morphine in the ED and also given IV fluids.  Foley catheter was placed for urinary retention in the ED.  ED physician also contacted general surgery/Dr. Redmond Pulling who  recommended that the patient should continue using stool softener and also recommended milk of magnesia and sitz bath.  General surgery also recommended avoiding opioids and and also recommended admission to the medicine    Hospital Course:   Following conditions were addressed during hospitalization as listed below,  Post hemorrhoidectomy- rectal bleed. No further bleeding the hospital.  Patient remained hemodynamically stable.  Patient was seen by general surgery.  Recommended tub baths and conservative treatment.  Patient was advised to seek medical attention for ongoing bleeding or symptoms.      Acute urinary retention Surgery recommended to keep the Foley catheter  and follow-up with general surgery for removal on the next visit.    Constipation Continue stool softeners on discharge  Disposition.  At this time, patient is stable for disposition home.  Patient will have to follow-up with Dr. Christian Mate general surgery as outpatient.  Medical Consultants:    General surgery  Procedures:    None Subjective:   Today, patient denies further rectal bleeding.  No dizziness, lightheadedness.  Discharge Exam:   Vitals:   11/23/19 0800 11/23/19 1047  BP:  106/60  Pulse:  70  Resp: 18 16  Temp:  99 F (37.2 C)  SpO2: 97% 98%   Vitals:   11/23/19 0128 11/23/19 0510 11/23/19 0800 11/23/19 1047  BP: 134/84 116/73  106/60  Pulse: 72 71  70  Resp: 18 16 18 16   Temp: 98.8 F (37.1 C) 98.5 F (36.9 C)  99 F (37.2 C)  TempSrc: Oral Oral  Oral  SpO2: 100% 98% 97% 98%  Weight:      Height:       General: Alert awake, not in obvious distress HENT: pupils equally reacting to light,  No scleral pallor or icterus noted. Oral mucosa is moist.  Chest:  Clear breath sounds.  Diminished breath sounds bilaterally. No crackles or wheezes.  CVS: S1 &S2 heard. No murmur.  Regular rate and rhythm. Abdomen: Soft, nontender, nondistended.  Bowel sounds are heard.  Foley Catheter in  place. Extremities: No cyanosis, clubbing or edema.  Peripheral pulses are palpable. Psych: Alert, awake and oriented, normal mood CNS:  No cranial nerve deficits.  Power equal in all extremities.   Skin: Warm and dry.  No rashes noted.  The results of significant diagnostics from this hospitalization (including imaging, microbiology, ancillary and laboratory) are listed below for reference.     Diagnostic Studies:   CT ABDOMEN PELVIS W CONTRAST  Result Date: 11/22/2019 CLINICAL DATA:  Abdominal pain and fever, status post 5 days postop hemorrhoidectomy. EXAM: CT ABDOMEN AND PELVIS WITH CONTRAST TECHNIQUE: Multidetector CT imaging of the abdomen and pelvis was performed using the standard protocol following bolus administration of intravenous contrast. CONTRAST:  178mL OMNIPAQUE IOHEXOL 300 MG/ML  SOLN COMPARISON:  October 19, 2010 FINDINGS: Lower chest: No acute abnormality. Hepatobiliary: There is diffuse fatty infiltration of the liver parenchyma. No focal liver abnormality is seen. No gallstones, gallbladder wall thickening, or biliary dilatation. Pancreas: Unremarkable. No pancreatic ductal dilatation or surrounding inflammatory changes. Spleen: Normal in size without focal abnormality. Adrenals/Urinary Tract: Adrenal glands are unremarkable. Kidneys are normal, without renal calculi, focal lesion, or hydronephrosis. A Foley catheter is seen within the urinary bladder. Stomach/Bowel: Stomach is within normal limits. Appendix appears normal. No evidence of bowel wall thickening, distention, or inflammatory changes. Vascular/Lymphatic: No significant vascular findings are present. No enlarged abdominal or pelvic lymph nodes. Reproductive: Prostate is unremarkable. Other: There is a 3.0 cm x 1.8 cm fat containing left inguinal hernia. Musculoskeletal: No acute or significant osseous findings. IMPRESSION: 1. No acute process in the abdomen or pelvis. 2. Hepatic steatosis. 3. Fat-containing left inguinal  hernia. Electronically Signed   By: Virgina Norfolk M.D.   On: 11/22/2019 21:28     Labs:   Basic Metabolic Panel: Recent Labs  Lab 11/22/19 2001 11/23/19 0546  NA 138 137  K 4.0 4.1  CL 106 107  CO2 20* 22  GLUCOSE 107* 99  BUN 14 13  CREATININE 1.15 1.00  CALCIUM 9.2 8.6*   GFR Estimated Creatinine Clearance: 126.9 mL/min (by C-G formula based on SCr of 1 mg/dL). Liver Function Tests: Recent Labs  Lab 11/22/19 2001 11/23/19 0546  AST 27 22  ALT 41 33  ALKPHOS 66 57  BILITOT 0.6 0.6  PROT 8.1 6.3*  ALBUMIN 4.1 3.4*   No results for input(s): LIPASE, AMYLASE in the last 168 hours. No results for input(s): AMMONIA in the last 168 hours. Coagulation profile No results for input(s): INR, PROTIME in the last 168 hours.  CBC: Recent Labs  Lab 11/22/19 2001 11/23/19 0546 11/23/19 1332  WBC 12.3* 11.0*  --   HGB 11.1* 9.3* 9.0*  HCT 39.3 32.7* 31.5*  MCV 76.6* 76.4*  --   PLT 300 245  --    Cardiac Enzymes: No results for input(s): CKTOTAL, CKMB, CKMBINDEX, TROPONINI in the last 168 hours. BNP: Invalid input(s): POCBNP CBG: No results for input(s): GLUCAP in the last 168 hours. D-Dimer No results for input(s): DDIMER in the last 72 hours. Hgb A1c  No results for input(s): HGBA1C in the last 72 hours. Lipid Profile No results for input(s): CHOL, HDL, LDLCALC, TRIG, CHOLHDL, LDLDIRECT in the last 72 hours. Thyroid function studies No results for input(s): TSH, T4TOTAL, T3FREE, THYROIDAB in the last 72 hours.  Invalid input(s): FREET3 Anemia work up No results for input(s): VITAMINB12, FOLATE, FERRITIN, TIBC, IRON, RETICCTPCT in the last 72 hours. Microbiology Recent Results (from the past 240 hour(s))  Respiratory Panel by RT PCR (Flu A&B, Covid) - Nasopharyngeal Swab     Status: None   Collection Time: 11/23/19  1:08 AM   Specimen: Nasopharyngeal Swab  Result Value Ref Range Status   SARS Coronavirus 2 by RT PCR NEGATIVE NEGATIVE Final    Comment:  (NOTE) SARS-CoV-2 target nucleic acids are NOT DETECTED. The SARS-CoV-2 RNA is generally detectable in upper respiratoy specimens during the acute phase of infection. The lowest concentration of SARS-CoV-2 viral copies this assay can detect is 131 copies/mL. A negative result does not preclude SARS-Cov-2 infection and should not be used as the sole basis for treatment or other patient management decisions. A negative result may occur with  improper specimen collection/handling, submission of specimen other than nasopharyngeal swab, presence of viral mutation(s) within the areas targeted by this assay, and inadequate number of viral copies (<131 copies/mL). A negative result must be combined with clinical observations, patient history, and epidemiological information. The expected result is Negative. Fact Sheet for Patients:  PinkCheek.be Fact Sheet for Healthcare Providers:  GravelBags.it This test is not yet ap proved or cleared by the [place] FDA and  has been authorized for detection and/or diagnosis of SARS-CoV-2 by FDA under an Emergency Use Authorization (EUA). This EUA will remain  in effect (meaning this test can be used) for the duration of the COVID-19 declaration under Section 564(b)(1) of the Act, 21 U.S.C. section 360bbb-3(b)(1), unless the authorization is terminated or revoked sooner.    Influenza A by PCR NEGATIVE NEGATIVE Final   Influenza B by PCR NEGATIVE NEGATIVE Final    Comment: (NOTE) The Xpert Xpress SARS-CoV-2/FLU/RSV assay is intended as an aid in  the diagnosis of influenza from Nasopharyngeal swab specimens and  should not be used as a sole basis for treatment. Nasal washings and  aspirates are unacceptable for Xpert Xpress SARS-CoV-2/FLU/RSV  testing. Fact Sheet for Patients: PinkCheek.be Fact Sheet for Healthcare  Providers: GravelBags.it This test is not yet approved or cleared by the [place] FDA and  has been authorized for detection and/or diagnosis of SARS-CoV-2 by  FDA under an Emergency Use Authorization (EUA). This EUA will remain  in effect (meaning this test can be used) for the duration of the  Covid-19 declaration under Section 564(b)(1) of the Act, 21  U.S.C. section 360bbb-3(b)(1), unless the authorization is  terminated or revoked. Performed at [facility], [place] [address] [place], [place] [address]      Discharge Instructions:   Discharge Instructions    Diet - low sodium heart healthy   Complete by: As directed    Discharge instructions   Complete by: As directed    Follow-up with Dr. Christian Mate, general surgery in 3 days.  Continue Foley catheter for now until seen by general surgery.  If you experience worsening bleeding or symptoms, please seek medical attention.  Consider warm tub baths at home twice a day   Increase activity slowly   Complete by: As directed      Allergies as of 11/23/2019   No Known  Allergies     Medication List    TAKE these medications   calcium carbonate 500 MG chewable tablet Commonly known as: TUMS - dosed in mg elemental calcium Chew 1 tablet by mouth as needed for indigestion or heartburn.   citalopram 20 MG tablet Commonly known as: CELEXA Take 1 tablet (20 mg total) by mouth daily. What changed: when to take this   guaiFENesin 600 MG 12 hr tablet Commonly known as: MUCINEX Take 600 mg by mouth 2 (two) times daily as needed for cough.   HYDROcodone-acetaminophen 5-325 MG tablet Commonly known as: NORCO/VICODIN Take 1 tablet by mouth every 6 (six) hours as needed for moderate pain.   ibuprofen 200 MG tablet Commonly known as: ADVIL Take 400 mg by mouth every 6 (six) hours as needed for moderate pain.   MULTIVITAMIN/IRON PO Take 1 tablet by mouth daily.   senna-docusate 8.6-50 MG  tablet Commonly known as: Senokot-S Take 2 tablets by mouth 2 (two) times daily for 10 days.       Time coordinating discharge: 39 minutes  Signed:  Isael Stille  [facility] 11/23/2019, 2:35 PM

## 2019-11-24 ENCOUNTER — Observation Stay
Admission: EM | Admit: 2019-11-24 | Discharge: 2019-11-25 | Disposition: A | Discharge status: Home / Self Care | Payer: BC Managed Care – PPO | Attending: Surgery | Admitting: Surgery

## 2019-11-24 ENCOUNTER — Other Ambulatory Visit: Payer: Self-pay

## 2019-11-24 ENCOUNTER — Telehealth: Payer: Self-pay | Admitting: *Deleted

## 2019-11-24 ENCOUNTER — Encounter: Payer: Self-pay | Admitting: Emergency Medicine

## 2019-11-24 DIAGNOSIS — R52 Pain, unspecified: Secondary | ICD-10-CM | POA: Diagnosis not present

## 2019-11-24 DIAGNOSIS — F17290 Nicotine dependence, other tobacco product, uncomplicated: Secondary | ICD-10-CM | POA: Diagnosis not present

## 2019-11-24 DIAGNOSIS — G4730 Sleep apnea, unspecified: Secondary | ICD-10-CM | POA: Diagnosis not present

## 2019-11-24 DIAGNOSIS — G8918 Other acute postprocedural pain: Secondary | ICD-10-CM | POA: Diagnosis not present

## 2019-11-24 DIAGNOSIS — F329 Major depressive disorder, single episode, unspecified: Secondary | ICD-10-CM | POA: Diagnosis not present

## 2019-11-24 DIAGNOSIS — R000 Tachycardia, unspecified: Secondary | ICD-10-CM | POA: Diagnosis not present

## 2019-11-24 DIAGNOSIS — K219 Gastro-esophageal reflux disease without esophagitis: Secondary | ICD-10-CM | POA: Diagnosis not present

## 2019-11-24 DIAGNOSIS — K6289 Other specified diseases of anus and rectum: Secondary | ICD-10-CM | POA: Insufficient documentation

## 2019-11-24 DIAGNOSIS — K5900 Constipation, unspecified: Secondary | ICD-10-CM | POA: Diagnosis not present

## 2019-11-24 DIAGNOSIS — I10 Essential (primary) hypertension: Secondary | ICD-10-CM | POA: Diagnosis not present

## 2019-11-24 DIAGNOSIS — R0902 Hypoxemia: Secondary | ICD-10-CM | POA: Diagnosis not present

## 2019-11-24 LAB — COMPREHENSIVE METABOLIC PANEL
ALT: 32 U/L (ref 0–44)
AST: 24 U/L (ref 15–41)
Albumin: 3.7 g/dL (ref 3.5–5.0)
Alkaline Phosphatase: 64 U/L (ref 38–126)
Anion gap: 5 (ref 5–15)
BUN: 17 mg/dL (ref 6–20)
CO2: 22 mmol/L (ref 22–32)
Calcium: 8.1 mg/dL — ABNORMAL LOW (ref 8.9–10.3)
Chloride: 112 mmol/L — ABNORMAL HIGH (ref 98–111)
Creatinine, Ser: 1.03 mg/dL (ref 0.61–1.24)
GFR calc Af Amer: >60 mL/min (ref >60)
GFR calc non Af Amer: >60 mL/min (ref >60)
Glucose, Bld: 112 mg/dL — ABNORMAL HIGH (ref 70–99)
Potassium: 3.9 mmol/L (ref 3.5–5.1)
Sodium: 139 mmol/L (ref 135–145)
Total Bilirubin: 0.4 mg/dL (ref 0.3–1.2)
Total Protein: 7.2 g/dL (ref 6.5–8.1)

## 2019-11-24 LAB — CBC
HCT: 30.6 % — ABNORMAL LOW (ref 39.0–52.0)
Hemoglobin: 8.9 g/dL — ABNORMAL LOW (ref 13.0–17.0)
MCH: 22.5 pg — ABNORMAL LOW (ref 26.0–34.0)
MCHC: 29.1 g/dL — ABNORMAL LOW (ref 30.0–36.0)
MCV: 77.3 fL — ABNORMAL LOW (ref 80.0–100.0)
Platelets: 236 10*3/uL (ref 150–400)
RBC: 3.96 MIL/uL — ABNORMAL LOW (ref 4.22–5.81)
RDW: 17.1 % — ABNORMAL HIGH (ref 11.5–15.5)
WBC: 12.1 10*3/uL — ABNORMAL HIGH (ref 4.0–10.5)
nRBC: 0 % (ref 0.0–0.2)

## 2019-11-24 LAB — URINE CULTURE: Culture: NO GROWTH

## 2019-11-24 LAB — LIPASE, BLOOD: Lipase: 28 U/L (ref 11–51)

## 2019-11-24 MED ORDER — METHOCARBAMOL 500 MG PO TABS: 500.0000 mg | ORAL_TABLET | Freq: Four times a day (QID) | ORAL | Status: DC | PRN

## 2019-11-24 MED ORDER — TRAMADOL HCL 50 MG PO TABS: 50.0000 mg | ORAL_TABLET | Freq: Four times a day (QID) | ORAL | Status: DC | PRN

## 2019-11-24 MED ORDER — KETOROLAC TROMETHAMINE 30 MG/ML IJ SOLN
30.0000 mg | Freq: Four times a day (QID) | INTRAMUSCULAR | Status: DC
  Administered 2019-11-24 – 2019-11-25 (×3): 30 mg via INTRAVENOUS
  Filled 2019-11-24 (×3): qty 1

## 2019-11-24 MED ORDER — ONDANSETRON HCL 4 MG/2ML IJ SOLN: 4.0000 mg | Freq: Four times a day (QID) | INTRAMUSCULAR | Status: DC | PRN

## 2019-11-24 MED ORDER — SENNA 8.6 MG PO TABS
1.0000 | ORAL_TABLET | Freq: Two times a day (BID) | ORAL | Status: DC
  Administered 2019-11-24: 8.6 mg via ORAL
  Filled 2019-11-24: qty 1

## 2019-11-24 MED ORDER — MORPHINE SULFATE (PF) 4 MG/ML IV SOLN
4.0000 mg | Freq: Once | INTRAVENOUS | Status: AC
  Administered 2019-11-24: 4 mg via INTRAVENOUS
  Filled 2019-11-24: qty 1

## 2019-11-24 MED ORDER — HYDROMORPHONE HCL 1 MG/ML IJ SOLN
1.0000 mg | INTRAMUSCULAR | Status: AC
  Administered 2019-11-24: 1 mg via INTRAVENOUS
  Filled 2019-11-24: qty 1

## 2019-11-24 MED ORDER — ZOLPIDEM TARTRATE 5 MG PO TABS: 5.0000 mg | ORAL_TABLET | Freq: Every evening | ORAL | Status: DC | PRN

## 2019-11-24 MED ORDER — MAGNESIUM CITRATE PO SOLN
1.0000 | Freq: Two times a day (BID) | ORAL | Status: AC
  Administered 2019-11-24: 1 via ORAL
  Filled 2019-11-24 (×2): qty 296

## 2019-11-24 MED ORDER — SORBITOL 70 % SOLN
30.0000 mL | Freq: Two times a day (BID) | Status: DC
  Administered 2019-11-24: 30 mL via ORAL
  Filled 2019-11-24 (×5): qty 30

## 2019-11-24 MED ORDER — HYDROMORPHONE HCL 1 MG/ML IJ SOLN: 1.0000 mg | INTRAMUSCULAR | Status: DC | PRN

## 2019-11-24 MED ORDER — POLYETHYLENE GLYCOL 3350 17 G PO PACK
68.0000 g | PACK | Freq: Two times a day (BID) | ORAL | Status: DC
  Administered 2019-11-24: 68 g via ORAL
  Filled 2019-11-24: qty 4

## 2019-11-24 MED ORDER — SODIUM CHLORIDE 0.9 % IV BOLUS
500.0000 mL | Freq: Once | INTRAVENOUS | Status: AC
  Administered 2019-11-24: 500 mL via INTRAVENOUS

## 2019-11-24 MED ORDER — KETOROLAC TROMETHAMINE 30 MG/ML IJ SOLN: 30.0000 mg | Freq: Four times a day (QID) | INTRAMUSCULAR | Status: DC | PRN

## 2019-11-24 MED ORDER — PANTOPRAZOLE SODIUM 40 MG IV SOLR
40.0000 mg | Freq: Every day | INTRAVENOUS | Status: DC
  Administered 2019-11-24: 40 mg via INTRAVENOUS
  Filled 2019-11-24: qty 40

## 2019-11-24 MED ORDER — ONDANSETRON HCL 4 MG/2ML IJ SOLN
4.0000 mg | Freq: Once | INTRAMUSCULAR | Status: AC
  Administered 2019-11-24: 4 mg via INTRAVENOUS
  Filled 2019-11-24: qty 2

## 2019-11-24 MED ORDER — ONDANSETRON 4 MG PO TBDP: 4.0000 mg | ORAL_TABLET | Freq: Four times a day (QID) | ORAL | Status: DC | PRN

## 2019-11-24 MED ORDER — OXYCODONE HCL 5 MG PO TABS
5.0000 mg | ORAL_TABLET | ORAL | Status: DC | PRN
  Administered 2019-11-24: 10 mg via ORAL
  Filled 2019-11-24: qty 2

## 2019-11-24 MED ORDER — SODIUM CHLORIDE 0.9 % IV SOLN: INTRAVENOUS | Status: DC

## 2019-11-24 MED ORDER — ACETAMINOPHEN 500 MG PO TABS
1000.0000 mg | ORAL_TABLET | Freq: Four times a day (QID) | ORAL | Status: DC
  Administered 2019-11-24 – 2019-11-25 (×3): 1000 mg via ORAL
  Filled 2019-11-24 (×3): qty 2

## 2019-11-24 NOTE — ED Provider Notes (Signed)
[facility] Emergency Department Provider Note   ____________________________________________   First MD Initiated Contact with Patient 11/24/19 1707     (approximate)  I have reviewed the triage vital signs and the nursing notes.   HISTORY  Chief Complaint Rectal Pain    HPI David Wong is a 34 y.o. male for evaluation of pain in his rectum  Patient reports in a bowel movement today sudden severe pain in his rectal region.  Very sharp severe intensity.  EMS provided 200 mcg of fentanyl.   Patient reports ongoing severe 10 of 10 sharp rectal pain  Denies fevers or chills.  Had a bowel movement today first good when he had about a week.  He was hospitalized a few days ago for severe constipation and still has a urinary catheter in place  No issues with the catheter.  Denies significant abdominal pain, reports all the pain is located his rectum and began abruptly after a bowel movement.  He is not seen any further bleeding in his stools  Past Medical History:  Diagnosis Date  . Anemia   . Anxiety   . Depression   . GERD (gastroesophageal reflux disease)    occ   . Sleep apnea    does not have cpap    Patient Active Problem List   Diagnosis Date Noted  . Lower GI bleeding 11/23/2019  . Acute urinary retention 11/23/2019  . Constipation 11/23/2019  . Hemorrhoids   . Iron deficiency anemia 09/04/2017  . Depression, major, single episode, in partial remission ([place]) 06/29/2017  . Sleep apnea 06/29/2017  . Anxiety 01/08/2015  . Insomnia 01/08/2015    Past Surgical History:  Procedure Laterality Date  . COLONOSCOPY WITH PROPOFOL N/A 10/08/2018   Procedure: COLONOSCOPY WITH PROPOFOL;  Surgeon: Lin Landsman, MD;  Location: [facility] ENDOSCOPY;  Service: Gastroenterology;  Laterality: N/A;  . ESOPHAGOGASTRODUODENOSCOPY (EGD) WITH PROPOFOL N/A 10/08/2018   Procedure: ESOPHAGOGASTRODUODENOSCOPY (EGD) WITH PROPOFOL;  Surgeon: Lin Landsman, MD;  Location: [facility] ENDOSCOPY;  Service: Gastroenterology;  Laterality: N/A;  . HEMORRHOID SURGERY N/A 11/17/2019   Procedure: [place] Internal & External;  Surgeon: Ronny Bacon, MD;  Location: [facility] ORS;  Service: General;  Laterality: N/A;  . right pinky finger  2013   pins inserted    Prior to Admission medications   Medication Sig Start Date End Date Taking? Authorizing Provider  calcium carbonate (TUMS - DOSED IN MG ELEMENTAL CALCIUM) 500 MG chewable tablet Chew 1 tablet by mouth as needed for indigestion or heartburn.    [provider]  citalopram (CELEXA) 20 MG tablet Take 1 tablet (20 mg total) by mouth daily. Patient taking differently: Take 20 mg by mouth every morning.  09/01/19   Cannady, Henrine Screws T, NP  guaiFENesin (MUCINEX) 600 MG 12 hr tablet Take 600 mg by mouth 2 (two) times daily as needed for cough.    [provider]  HYDROcodone-acetaminophen (NORCO/VICODIN) 5-325 MG tablet Take 1 tablet by mouth every 6 (six) hours as needed for moderate pain. 11/17/19   Ronny Bacon, MD  ibuprofen (ADVIL) 200 MG tablet Take 400 mg by mouth every 6 (six) hours as needed for moderate pain.    [provider]  Multiple Vitamins-Iron (MULTIVITAMIN/IRON PO) Take 1 tablet by mouth daily.    [provider]  senna-docusate (SENOKOT-S) 8.6-50 MG tablet Take 2 tablets by mouth 2 (two) times daily for 10 days. 11/23/19 12/03/19  Flora Lipps, MD    Allergies Patient  has no known allergies.  Family History  Problem Relation Age of Onset  . Hyperlipidemia Mother   . Hypertension Mother   . Hyperlipidemia Father   . Diabetes Father   . Hypertension Brother   . Arthritis Maternal Grandfather        RA  . Heart disease Paternal Grandfather     Social History Social History   Tobacco Use  . Smoking status: Current Every Day Smoker    Years: 5.00    Types: E-cigarettes  . Smokeless tobacco: Never Used  Substance Use Topics  .  Alcohol use: Yes    Alcohol/week: 0.0 standard drinks    Comment: Rarely  . Drug use: No    Review of Systems Constitutional: No fever/chills Cardiovascular: Denies chest pain. Respiratory: Denies shortness of breath. Gastrointestinal: No abdominal pain.  See HPI Genitourinary: Negative for dysuria. Musculoskeletal: Negative for back pain. Skin: Negative for rash. Neurological: Negative for headaches, areas of focal weakness or numbness.    ____________________________________________   PHYSICAL EXAM:  VITAL SIGNS: ED Triage Vitals  Enc Vitals Group     BP 11/24/19 1710 135/82     Pulse Rate 11/24/19 1710 (!) 105     Resp 11/24/19 1710 20     Temp 11/24/19 1710 98.9 F (37.2 C)     Temp Source 11/24/19 1710 Oral     SpO2 11/24/19 1710 98 %     Weight 11/24/19 1711 225 lb (102.1 kg)     Height 11/24/19 1711 5\' 10"  (1.778 m)     Head Circumference --      Peak Flow --      Pain Score 11/24/19 1710 9     Pain Loc --      Pain Edu? --      Excl. in [place]? --     Constitutional: Alert and oriented.  Appears in significant pain, wincing.  Reports severe perirectal pain Eyes: Conjunctivae are normal. Head: Atraumatic. Nose: No congestion/rhinnorhea. Mouth/Throat: Mucous membranes are moist. Neck: No stridor.  Cardiovascular: Normal rate, regular rhythm. Grossly normal heart sounds.  Good peripheral circulation. Respiratory: Normal respiratory effort.  No retractions. Lungs CTAB. Gastrointestinal: Soft and nontender. No distention.  Significant tenderness in the perirectal region.  He has absorbable sutures and what appears to be several hernia resection sites, none are bleeding.  There is no noted erythema.  There is some bruising of the perineum.  Patient reports severe intense sharp pain in that area upon examination.  Digital rectal exam is not performed Musculoskeletal: No lower extremity tenderness nor edema. Neurologic:  Normal speech and language. No gross focal  neurologic deficits are appreciated.  Skin:  Skin is warm, dry and intact. No rash noted. Psychiatric: Mood and affect are normal. Speech and behavior are normal.  ____________________________________________   LABS (all labs ordered are listed, but only abnormal results are displayed)  Labs Reviewed  CBC - Abnormal; Notable for the following components:      Result Value   WBC 12.1 (*)    RBC 3.96 (*)    Hemoglobin 8.9 (*)    HCT 30.6 (*)    MCV 77.3 (*)    MCH 22.5 (*)    MCHC 29.1 (*)    RDW 17.1 (*)    All other components within normal limits  COMPREHENSIVE METABOLIC PANEL - Abnormal; Notable for the following components:   Chloride 112 (*)    Glucose, Bld 112 (*)    Calcium 8.1 (*)  All other components within normal limits  LIPASE, BLOOD   ____________________________________________  EKG   ____________________________________________  RADIOLOGY  I reviewed CT scan performed April 24, at that time no acute intra-abdominal findings ____________________________________________   PROCEDURES  Procedure(s) performed: None  Procedures  Critical Care performed: No  ____________________________________________   INITIAL IMPRESSION / ASSESSMENT AND PLAN / ED COURSE  Pertinent labs & imaging results that were available during my care of the patient were reviewed by me and considered in my medical decision making (see chart for details).   Patient presents for severe rectal pain.  Occurring after a bowel movement.  This in the setting of recent her hemorrhoid surgery.  No obvious evidence of infection by clinical history or exam.  Does report severe ongoing rectal pain, suspect likely some possible spasm versus irritation of surgical site, seems less likely that there would be associated infection or abscess specially given his recent work-up including CT scan.  Denies any acute urinary issues at this time  Denies symptoms that would suggest ongoing  constipation or bowel obstruction.  He is eating and drinking now had his first bowel movement today.  Denies black or bloody stool  Clinical Course as of Nov 23 2009  Mon Nov 24, 2019  1712 Discussed with Dr. Christian Mate   [MQ]    Clinical Course User Index [MQ] Delman Kitten, MD   ----------------------------------------- 5:56 PM on 11/24/2019 -----------------------------------------  Patient reporting ongoing severe 10 out of 10 rectal pain.  Appears in severe pain at this time, will trial hydromorphone at this time.  General surgery advising pain control, and if unable to provide good pain relief in the ER Dr. Milas Gain would recommend admission  LERIN CHAPPLE was evaluated in Emergency Department on 11/24/2019 for the symptoms described in the history of present illness. He was evaluated in the context of the global COVID-19 pandemic, which necessitated consideration that the patient might be at risk for infection with the SARS-CoV-2 virus that causes COVID-19. Institutional protocols and algorithms that pertain to the evaluation of patients at risk for COVID-19 are in a state of rapid change based on information released by regulatory bodies including the CDC and federal and state organizations. These policies and algorithms were followed during the patient's care in the ED. patient had negative Covid test less than 48 hours prior and is not having active symptoms  ----------------------------------------- 8:11 PM on 11/24/2019 -----------------------------------------  Patient's rectal pain is returned to severe.  He appears quite uncomfortable once again, wincing.  He is slightly tachycardic I suspect secondary to pain at this time.  Discussed with the patient, also discussed with general surgery, general surgery will admit him for further work-up and pain control.  Discussed case with Dr. Milas Gain  ____________________________________________   FINAL CLINICAL IMPRESSION(S) / ED  DIAGNOSES  Final diagnoses:  Rectal pain        Note:  This document was prepared using Dragon voice recognition software and may include unintentional dictation errors       Delman Kitten, MD 11/24/19 2011

## 2019-11-24 NOTE — ED Triage Notes (Signed)
Pt via EMS from home. Pt c/o rectal pain and rectal bleeding. Pt had 3 hemorrhoids removed on Monday 04/19. Pt was also at [facility] ED on Saturday due to profuse rectal bleeding. Upon arrival, pt is A&Ox4 but is in notable amount of pain. EMS gave 211mcg of Fentanyl, 4mg  of Zofran, and 1L of fluids en route. Denies abd pain at this time.

## 2019-11-24 NOTE — ED Notes (Signed)
Pt requesting water. Pt given water and sandwich tray with Dr. Jacqualine Code, MD approval.

## 2019-11-24 NOTE — ED Notes (Signed)
Pt still in a notable amount of discomfort. Pt states that the morphine did not help at all.

## 2019-11-24 NOTE — Telephone Encounter (Signed)
error 

## 2019-11-25 ENCOUNTER — Encounter: Payer: BC Managed Care – PPO | Admitting: Surgery

## 2019-11-25 DIAGNOSIS — K6289 Other specified diseases of anus and rectum: Secondary | ICD-10-CM

## 2019-11-25 MED ORDER — CHLORHEXIDINE GLUCONATE CLOTH 2 % EX PADS
6.0000 | MEDICATED_PAD | Freq: Every day | CUTANEOUS | Status: DC
  Administered 2019-11-25: 6 via TOPICAL

## 2019-11-25 MED ORDER — POLYETHYLENE GLYCOL 3350 17 G PO PACK: 17.0000 g | PACK | Freq: Two times a day (BID) | ORAL | 0 refills | Status: AC

## 2019-11-25 MED ORDER — IBUPROFEN 200 MG PO TABS: 400.0000 mg | ORAL_TABLET | Freq: Four times a day (QID) | ORAL | 0 refills | Status: DC | PRN

## 2019-11-25 MED ORDER — METHOCARBAMOL 500 MG PO TABS: 500.0000 mg | ORAL_TABLET | Freq: Four times a day (QID) | ORAL | 0 refills | Status: DC | PRN

## 2019-11-25 MED ORDER — HYDROCODONE-ACETAMINOPHEN 5-325 MG PO TABS: 1.0000 | ORAL_TABLET | Freq: Four times a day (QID) | ORAL | 0 refills | Status: DC | PRN

## 2019-11-25 NOTE — H&P (Signed)
[facility] SURGICAL HISTORY & PHYSICAL (cpt [phone])  HISTORY OF PRESENT ILLNESS (HPI):  34 y.o. male presented to [facility] ED yesterday for rectal pain. Patient is s/p hemorrhoidectomy on 04/19 with Dr Christian Mate. He presented to [facility] ED on 04/24 for similar complaints of difficulty with urination and "GI Bleeding." At that time, he had a "large gush of blood" from his rectum which prompted his presentation. Work up there, which included CT Scan, was essentially unremarkable. He was admitted at that time for observation, had a foley placed for urinary retention, and was discharged home. He presented to [facility] yesterday evening with complaints of rectal pain. He reports that he had a large BM at home and noticed the acute onset of very sharp rectal pain. This was severe in nature and rated a 10/10. No blood in his stool. Patient received fentanyl in route but this did not improve his pain significantly. He notes that he has had issues with constipation post op despite use of magnesium sulfate and Miralax at home. No fever, chills, nausea, emesis, abdominal pain, SOB, CP. He has had no issue with his foley. Work up in the ED revealed very mild leukocytosis to 12K (which is unchanged from labs on 04/24), Hgb 8.9 (9.0 on 04/25), and otherwise labs were unremarkable. Pain control was attempted in the ED but unsuccessful.   General surgery is consulted by emergency medicine physician Dr Delman Kitten, MD for evaluation and management of post-op rectal pain.    PAST MEDICAL HISTORY (PMH):  Past Medical History:  Diagnosis Date  . Anemia   . Anxiety   . Depression   . GERD (gastroesophageal reflux disease)    occ   . Sleep apnea    does not have cpap    Reviewed. Otherwise negative.   PAST SURGICAL HISTORY ([place]):  Past Surgical History:  Procedure Laterality Date  . COLONOSCOPY WITH PROPOFOL N/A 10/08/2018   Procedure: COLONOSCOPY WITH PROPOFOL;  Surgeon: Lin Landsman, MD;   Location: [facility] ENDOSCOPY;  Service: Gastroenterology;  Laterality: N/A;  . ESOPHAGOGASTRODUODENOSCOPY (EGD) WITH PROPOFOL N/A 10/08/2018   Procedure: ESOPHAGOGASTRODUODENOSCOPY (EGD) WITH PROPOFOL;  Surgeon: Lin Landsman, MD;  Location: [facility] ENDOSCOPY;  Service: Gastroenterology;  Laterality: N/A;  . HEMORRHOID SURGERY N/A 11/17/2019   Procedure: [place] Internal & External;  Surgeon: Ronny Bacon, MD;  Location: [facility] ORS;  Service: General;  Laterality: N/A;  . right pinky finger  2013   pins inserted    Reviewed. Otherwise negative.   MEDICATIONS:  Prior to Admission medications   Medication Sig Start Date End Date Taking? Authorizing Provider  calcium carbonate (TUMS - DOSED IN MG ELEMENTAL CALCIUM) 500 MG chewable tablet Chew 1 tablet by mouth as needed for indigestion or heartburn.   Yes [provider]  citalopram (CELEXA) 20 MG tablet Take 1 tablet (20 mg total) by mouth daily. 09/01/19  Yes Cannady, Jolene T, NP  guaiFENesin (MUCINEX) 600 MG 12 hr tablet Take 600 mg by mouth 2 (two) times daily as needed for cough.   Yes [provider]  HYDROcodone-acetaminophen (NORCO/VICODIN) 5-325 MG tablet Take 1 tablet by mouth every 6 (six) hours as needed for moderate pain. 11/17/19  Yes Ronny Bacon, MD  ibuprofen (ADVIL) 200 MG tablet Take 400 mg by mouth every 6 (six) hours as needed for moderate pain.   Yes [provider]  Multiple Vitamins-Iron (MULTIVITAMIN/IRON PO) Take 1 tablet by mouth daily.   Yes [provider]  senna-docusate (SENOKOT-S) 8.6-50  MG tablet Take 2 tablets by mouth 2 (two) times daily for 10 days. 11/23/19 12/03/19 Yes Pokhrel, Corrie Mckusick, MD     ALLERGIES:  No Known Allergies   SOCIAL HISTORY:  Social History   Socioeconomic History  . Marital status: Single    Spouse name: Not on file  . Number of children: Not on file  . Years of education: Not on file  . Highest education level: Not on file  Occupational  History  . Not on file  Tobacco Use  . Smoking status: Current Every Day Smoker    Years: 5.00    Types: E-cigarettes  . Smokeless tobacco: Never Used  Substance and Sexual Activity  . Alcohol use: Yes    Alcohol/week: 0.0 standard drinks    Comment: Rarely  . Drug use: No  . Sexual activity: Yes    Partners: Female    Comment: Fiance on birth control  Other Topics Concern  . Not on file  Social History Narrative  . Not on file   Social Determinants of Health   Financial Resource Strain:   . Difficulty of Paying Living Expenses:   Food Insecurity:   . Worried About Charity fundraiser in the Last Year:   . Arboriculturist in the Last Year:   Transportation Needs:   . Film/video editor (Medical):   Marland Kitchen Lack of Transportation (Non-Medical):   Physical Activity:   . Days of Exercise per Week:   . Minutes of Exercise per Session:   Stress:   . Feeling of Stress :   Social Connections:   . Frequency of Communication with Friends and Family:   . Frequency of Social Gatherings with Friends and Family:   . Attends Religious Services:   . Active Member of Clubs or Organizations:   . Attends Archivist Meetings:   Marland Kitchen Marital Status:   Intimate Partner Violence:   . Fear of Current or Ex-Partner:   . Emotionally Abused:   Marland Kitchen Physically Abused:   . Sexually Abused:      FAMILY HISTORY:  Family History  Problem Relation Age of Onset  . Hyperlipidemia Mother   . Hypertension Mother   . Hyperlipidemia Father   . Diabetes Father   . Hypertension Brother   . Arthritis Maternal Grandfather        RA  . Heart disease Paternal Grandfather     Otherwise negative.   REVIEW OF SYSTEMS:  Review of Systems  Constitutional: Negative for chills and fever.  HENT: Negative for congestion and sore throat.   Respiratory: Negative for cough and shortness of breath.   Cardiovascular: Negative for chest pain and palpitations.  Gastrointestinal: Positive for constipation.  Negative for abdominal pain, blood in stool, diarrhea, nausea and vomiting.       + rectal pain  Genitourinary: Negative for dysuria and hematuria.       + retention  All other systems reviewed and are negative.   VITAL SIGNS:  Temp:  [98.6 F (37 C)-100.3 F (37.9 C)] 98.6 F (37 C) (04/27 0409) Pulse Rate:  [82-107] 82 (04/27 0409) Resp:  [11-20] 20 (04/27 0409) BP: (108-136)/(66-82) 108/68 (04/27 0409) SpO2:  [97 %-100 %] 98 % (04/27 0409) Weight:  [100.5 kg-102.1 kg] 100.5 kg (04/26 2136)     Height: 5\' 10"  (177.8 cm) Weight: 100.5 kg BMI (Calculated): 31.79   PHYSICAL EXAM:  Physical Exam Vitals and nursing note reviewed.  Constitutional:  General: He is not in acute distress.    Appearance: Normal appearance. He is obese. He is not ill-appearing.  HENT:     Head: Normocephalic and atraumatic.  Eyes:     General: No scleral icterus.    Conjunctiva/sclera: Conjunctivae normal.  Pulmonary:     Effort: Pulmonary effort is normal. No respiratory distress.  Abdominal:     General: There is no distension.     Palpations: Abdomen is soft.     Tenderness: There is no abdominal tenderness. There is no guarding or rebound.  Genitourinary:      Comments: Well healing hemorrhoid repair, no blood per rectum Musculoskeletal:        General: Normal range of motion.     Right lower leg: No edema.     Left lower leg: No edema.  Skin:    General: Skin is warm and dry.  Neurological:     General: No focal deficit present.     Mental Status: He is alert and oriented to person, place, and time.  Psychiatric:        Mood and Affect: Mood normal.        Behavior: Behavior normal.     INTAKE/OUTPUT:  This shift: Total I/O In: -  Out: 125 [Urine:125]  Last 2 shifts: @IOLAST2SHIFTS @  Labs:  CBC Latest Ref Rng & Units 11/24/2019 11/23/2019 11/23/2019  WBC 4.0 - 10.5 K/uL 12.1(H) - 11.0(H)  Hemoglobin 13.0 - 17.0 g/dL 8.9(L) 9.0(L) 9.3(L)  Hematocrit 39.0 - 52.0 % 30.6(L)  31.5(L) 32.7(L)  Platelets 150 - 400 K/uL 236 - 245   CMP Latest Ref Rng & Units 11/24/2019 11/23/2019 11/22/2019  Glucose 70 - 99 mg/dL 112(H) 99 107(H)  BUN 6 - 20 mg/dL 17 13 14   Creatinine 0.61 - 1.24 mg/dL 1.03 1.00 1.15  Sodium 135 - 145 mmol/L 139 137 138  Potassium 3.5 - 5.1 mmol/L 3.9 4.1 4.0  Chloride 98 - 111 mmol/L 112(H) 107 106  CO2 22 - 32 mmol/L 22 22 20(L)  Calcium 8.9 - 10.3 mg/dL 8.1(L) 8.6(L) 9.2  Total Protein 6.5 - 8.1 g/dL 7.2 6.3(L) 8.1  Total Bilirubin 0.3 - 1.2 mg/dL 0.4 0.6 0.6  Alkaline Phos 38 - 126 U/L 64 57 66  AST 15 - 41 U/L 24 22 27   ALT 0 - 44 U/L 32 33 41     Imaging studies:  None   Assessment/Plan: (ICD-10's: K15.89) 34 y.o. male with post-op rectal pain and constipation 8 days s/p hemorrhoidectomy.    - Okay for regular diet  - Continue multi-modal pain management  - Continue aggressive bowel regimen  - Will need to continue sit baths out of the hospital; discussed hygiene   - medical management of comorbid conditions  - continue foley; urology outpatient follow up    - Discharge Planning: will reassess this afternoon  All of the above findings and recommendations were discussed with the patient, and all of his questions were answered to his expressed satisfaction.  -- David Simon, PA-C [facility] 11/25/2019, 7:45 AM [phone] M-F: 7am - 4pm

## 2019-11-25 NOTE — Discharge Summary (Signed)
[facility] SURGICAL DISCHARGE SUMMARY (cpt: [phone])  Patient ID: David Wong MRN: [id number] DOB/AGE: [birth year] 34 y.o.  Admit date: 11/24/2019 Discharge date: 11/25/2019  Discharge Diagnoses Patient Active Problem List   Diagnosis Date Noted  . Anal or rectal pain 11/24/2019  . Lower GI bleeding 11/23/2019  . Acute urinary retention 11/23/2019  . Constipation 11/23/2019  . Hemorrhoids     Consultants None  Procedures None  HPI: 34 y.o. male presented to [facility] ED yesterday for rectal pain. Patient is s/p hemorrhoidectomy on 04/19 with Dr Christian Mate. He presented to [facility] ED on 04/24 for similar complaints of difficulty with urination and "GI Bleeding." At that time, he had a "large gush of blood" from his rectum which prompted his presentation. Work up there, which included CT Scan, was essentially unremarkable. He was admitted at that time for observation, had a foley placed for urinary retention, and was discharged home. He presented to [facility] yesterday evening with complaints of rectal pain. He reports that he had a large BM at home and noticed the acute onset of very sharp rectal pain. This was severe in nature and rated a 10/10. No blood in his stool. Patient received fentanyl in route but this did not improve his pain significantly. He notes that he has had issues with constipation post op despite use of magnesium sulfate and Miralax at home. No fever, chills, nausea, emesis, abdominal pain, SOB, CP. He has had no issue with his foley. Work up in the ED revealed very mild leukocytosis to 12K (which is unchanged from labs on 04/24), Hgb 8.9 (9.0 on 04/25), and otherwise labs were unremarkable. Pain control was attempted in the ED but unsuccessful.   Hospital Course: Patient was admitted to general surgery for observation, pain management, and bowel regimen. On HD1, his pain improved with pain medications, he began to have bowel function, and his Hgb remained  stable. Advancement of patient's diet and ambulation were well-tolerated. The remainder of patient's hospital course was essentially unremarkable, and discharge planning was initiated accordingly with patient safely able to be discharged home with appropriate discharge instructions, pain control, and outpatient follow-up after all of his questions were answered to his expressed satisfaction.  We will remove foley catheter and do spontaneous voiding trial prior to discharge home.   Discharge Condition: Good    Allergies as of 11/25/2019   No Known Allergies     Medication List    TAKE these medications   calcium carbonate 500 MG chewable tablet Commonly known as: TUMS - dosed in mg elemental calcium Chew 1 tablet by mouth as needed for indigestion or heartburn.   citalopram 20 MG tablet Commonly known as: CELEXA Take 1 tablet (20 mg total) by mouth daily.   guaiFENesin 600 MG 12 hr tablet Commonly known as: MUCINEX Take 600 mg by mouth 2 (two) times daily as needed for cough.   HYDROcodone-acetaminophen 5-325 MG tablet Commonly known as: NORCO/VICODIN Take 1 tablet by mouth every 6 (six) hours as needed for moderate pain.   ibuprofen 200 MG tablet Commonly known as: ADVIL Take 2 tablets (400 mg total) by mouth every 6 (six) hours as needed for moderate pain.   methocarbamol 500 MG tablet Commonly known as: ROBAXIN Take 1 tablet (500 mg total) by mouth every 6 (six) hours as needed for muscle spasms.   MULTIVITAMIN/IRON PO Take 1 tablet by mouth daily.   polyethylene glycol 17 g packet Commonly known as: MIRALAX / GLYCOLAX Take 17  g by mouth 2 (two) times daily for 14 days.   senna-docusate 8.6-50 MG tablet Commonly known as: Senokot-S Take 2 tablets by mouth 2 (two) times daily for 10 days.            Discharge Care Instructions  (From admission, onward)         Start     Ordered   11/25/19 0000  Discharge wound care:    Comments: Sitz bath Daily    11/25/19 1350           Follow-up Information    Ronny Bacon, MD. Schedule an appointment as soon as possible for a visit in 1 week(s).   Specialty: General Surgery Why: s/p hemorrhoidectomy Contact information: [address] [place] [address] [phone]            Time spent on discharge management including discussion of hospital course, clinical condition, outpatient instructions, prescriptions, and follow up with the patient and members of the medical team: >30 minutes  -- Edison Simon , PA-C [facility]  11/25/2019, 1:50 PM [phone] M-F: 7am - 4pm

## 2019-11-28 ENCOUNTER — Ambulatory Visit: Payer: BC Managed Care – PPO | Admitting: Nurse Practitioner

## 2019-12-02 ENCOUNTER — Ambulatory Visit (INDEPENDENT_AMBULATORY_CARE_PROVIDER_SITE_OTHER): Payer: Self-pay | Admitting: Surgery

## 2019-12-02 ENCOUNTER — Encounter: Payer: BC Managed Care – PPO | Admitting: Surgery

## 2019-12-02 ENCOUNTER — Encounter: Payer: Self-pay | Admitting: Surgery

## 2019-12-02 ENCOUNTER — Other Ambulatory Visit: Payer: Self-pay

## 2019-12-02 VITALS — BP 117/80 | HR 91 | Temp 97.6°F | Resp 12 | Wt 221.4 lb

## 2019-12-02 DIAGNOSIS — Z9889 Other specified postprocedural states: Secondary | ICD-10-CM

## 2019-12-02 DIAGNOSIS — Z8719 Personal history of other diseases of the digestive system: Secondary | ICD-10-CM

## 2019-12-02 NOTE — Progress Notes (Signed)
[facility] POST-OP OFFICE VISIT  12/02/2019  HPI: David Wong is a 34 y.o. male 16 days s/p 3 column hemorrhoidectomy.  [name] has had 2 admissions for postoperative pain/constipation.  Since that time he has been utilizing MiraLAX at least twice daily with docusate daily, I do not believe he is resumed his fiber supplement.  He does have bright red blood in the toilet but not every time he has a bowel movement.  He reports his bowel movements are fairly liquid and about 5-6 times a day.  He does report his pain is a 1.5 on a scale of 10.  Denies any abdominal pain.  He had a low-grade fever after discharge with associated dysuria both have since resolved.  Reports a good appetite denies nausea and vomiting.  Vital signs: BP 117/80   Pulse 91   Temp 97.6 F (36.4 C)   Resp 12   Wt 221 lb 6.4 oz (100.4 kg)   SpO2 99%   BMI 31.77 kg/m    Physical Exam: Constitutional: Appears well, no evidence of toxicity or presence of pain. Abdomen: Benign nontender. Skin: Perianal skin has a small area still healing externally on the patient's right.  He is still quite tender on digital exam which is limited today.  Assessment/Plan: This is a 34 y.o. male 16 days s/p 3 column hemorrhoidectomy  Patient Active Problem List   Diagnosis Date Noted  . Anal or rectal pain 11/24/2019  . Lower GI bleeding 11/23/2019  . Acute urinary retention 11/23/2019  . Constipation 11/23/2019  . Hemorrhoids   . Iron deficiency anemia 09/04/2017  . Depression, major, single episode, in partial remission ([place]) 06/29/2017  . Sleep apnea 06/29/2017  . Anxiety 01/08/2015  . Insomnia 01/08/2015    -I continue to encourage him to push his fiber, ensure he gets adequate hydration.  He may then follow with MiraLAX to obtain the control/frequency he desires.  We discussed keeping him out of work for another 2 weeks with a follow-up at that time to assess his readiness to return.   Ronny Bacon M.D., FACS 12/02/2019, 2:39 PM

## 2019-12-02 NOTE — Patient Instructions (Signed)
Continue drinking plenty of fluids.   See your appointment below. Call the office if you have any questions or concerns.   Surgical Procedures for Hemorrhoids, Care After This sheet gives you information about how to care for yourself after your procedure. Your health care provider may also give you more specific instructions. If you have problems or questions, contact your health care provider. What can I expect after the procedure? After the procedure, it is common to have:  Rectal pain.  Pain when you are having a bowel movement.  Slight rectal bleeding. This is more likely to happen with the first bowel movement after surgery. Follow these instructions at home: Medicines  Take over-the-counter and prescription medicines only as told by your health care provider.  If you were prescribed an antibiotic medicine, use it as told by your health care provider. Do not stop using the antibiotic even if your condition improves.  Ask your health care provider if the medicine prescribed to you requires you to avoid driving or using heavy machinery.  Use a stool softener or a bulk laxative as told by your health care provider. Eating and drinking  Follow instructions from your health care provider about what to eat or drink after your procedure.  You may need to take actions to prevent or treat constipation, such as: ? Drink enough fluid to keep your urine pale yellow. ? Take over-the-counter or prescription medicines. ? Eat foods that are high in fiber, such as beans, whole grains, and fresh fruits and vegetables. ? Limit foods that are high in fat and processed sugars, such as fried or sweet foods. Activity   Rest as told by your health care provider.  Avoid sitting for a long time without moving. Get up to take short walks every 1-2 hours. This is important to improve blood flow and breathing. Ask for help if you feel weak or unsteady.  Return to your normal activities as told by  your health care provider. Ask your health care provider what activities are safe for you.  Do not lift anything that is heavier than 10 lb (4.5 kg), or the limit that you are told, until your health care provider says that it is safe.  Do not strain to have a bowel movement.  Do not spend a long time sitting on the toilet. General instructions   Take warm sitz baths for 15-20 minutes, 2-3 times a day to relieve soreness or itching and to keep the rectal area clean.  Apply ice packs to the area to reduce swelling and pain.  Do not drive for 24 hours if you were given a sedative during your procedure.  Keep all follow-up visits as told by your health care provider. This is important. Contact a health care provider if:  Your pain medicine is not helping.  You have a fever or chills.  You have bad smelling drainage.  You have a lot of swelling.  You become constipated.  You have trouble passing urine. Get help right away if:  You have very bad rectal pain.  You have heavy bleeding from your rectum. Summary  After the procedure, it is common to have pain and slight rectal bleeding.  Take warm sitz baths for 15-20 minutes, 2-3 times a day to relieve soreness or itching and to keep the rectal area clean.  Avoid straining when having a bowel movement.  Eat foods that are high in fiber, such as beans, whole grains, and fresh fruits and vegetables.  Take over-the-counter and prescription medicines only as told by your health care provider. This information is not intended to replace advice given to you by your health care provider. Make sure you discuss any questions you have with your health care provider. Document Revised: 01/01/2019 Document Reviewed: 06/04/2018 Elsevier Patient Education  [place].

## 2019-12-03 ENCOUNTER — Encounter: Payer: Self-pay | Admitting: Emergency Medicine

## 2019-12-03 ENCOUNTER — Telehealth: Payer: Self-pay | Admitting: Emergency Medicine

## 2019-12-03 NOTE — Telephone Encounter (Signed)
Pt called stating his job is needing a work note and treatment plan faxed to them.  F:[phone]  Note printed and faxed to the number above at this time. Pt made aware.

## 2019-12-16 ENCOUNTER — Encounter: Payer: Self-pay | Admitting: Surgery

## 2019-12-16 ENCOUNTER — Ambulatory Visit (INDEPENDENT_AMBULATORY_CARE_PROVIDER_SITE_OTHER): Payer: Self-pay | Admitting: Surgery

## 2019-12-16 ENCOUNTER — Other Ambulatory Visit: Payer: Self-pay

## 2019-12-16 VITALS — BP 116/78 | HR 90 | Temp 97.4°F | Resp 12 | Ht 70.0 in | Wt 221.8 lb

## 2019-12-16 DIAGNOSIS — Z9889 Other specified postprocedural states: Secondary | ICD-10-CM

## 2019-12-16 DIAGNOSIS — Z8719 Personal history of other diseases of the digestive system: Secondary | ICD-10-CM

## 2019-12-16 NOTE — Patient Instructions (Signed)
Follow up as needed. Call the office if you have any questions or concerns.  

## 2019-12-16 NOTE — Progress Notes (Signed)
[facility] POST-OP OFFICE VISIT  12/16/2019  HPI: David Wong is a 34 y.o. male 30 days s/p hemorrhoidectomy.  He reports his pain is now minimal to mild.  Denies any nausea, vomiting or fevers or chills.  Reports he continues to do sitz bath's and is still taking MiraLAX in addition to his fiber supplementation.  He reports about 3-4 bowel movements daily which are easy to move.  Patient reports very little blood noted on wiping alone.  Overall he endorses that we have achieved the goals that he was pursuing with surgery.  Vital signs: BP 116/78   Pulse 90   Temp (!) 97.4 F (36.3 C)   Resp 12   Ht 5\' 10"  (1.778 m)   Wt 221 lb 12.8 oz (100.6 kg)   SpO2 98%   BMI 31.82 kg/m    Physical Exam: Constitutional: Appears well. Abdomen: Soft and nontender. Skin: 2 linear areas of perianal skin, slightly proud flesh/well granulated.  No edema, no tenderness, no induration.  Much more tolerant of exam today.  Assessment/Plan: This is a 34 y.o. male 30 days s/p hemorrhoidectomy  Patient Active Problem List   Diagnosis Date Noted  . Anal or rectal pain 11/24/2019  . Lower GI bleeding 11/23/2019  . Acute urinary retention 11/23/2019  . Constipation 11/23/2019  . Hemorrhoids   . Iron deficiency anemia 09/04/2017  . Depression, major, single episode, in partial remission ([place]) 06/29/2017  . Sleep apnea 06/29/2017  . Anxiety 01/08/2015  . Insomnia 01/08/2015    -We discussed his bowel regimen once again, hoping to utilize MiraLAX as a backup/rescue.  But he knows he must maintain the regularity he is currently on. I will be glad to see him back as needed.   Ronny Bacon M.D., FACS 12/16/2019, 9:58 AM

## 2020-01-23 ENCOUNTER — Encounter: Payer: Self-pay | Admitting: Nurse Practitioner

## 2020-01-23 ENCOUNTER — Other Ambulatory Visit: Payer: Self-pay

## 2020-01-23 ENCOUNTER — Ambulatory Visit (INDEPENDENT_AMBULATORY_CARE_PROVIDER_SITE_OTHER): Payer: BC Managed Care – PPO | Admitting: Nurse Practitioner

## 2020-01-23 VITALS — BP 124/75 | HR 79 | Temp 98.3°F | Wt 225.6 lb

## 2020-01-23 DIAGNOSIS — F419 Anxiety disorder, unspecified: Secondary | ICD-10-CM

## 2020-01-23 DIAGNOSIS — Z6832 Body mass index (BMI) 32.0-32.9, adult: Secondary | ICD-10-CM

## 2020-01-23 DIAGNOSIS — F324 Major depressive disorder, single episode, in partial remission: Secondary | ICD-10-CM | POA: Diagnosis not present

## 2020-01-23 DIAGNOSIS — K640 First degree hemorrhoids: Secondary | ICD-10-CM

## 2020-01-23 DIAGNOSIS — D500 Iron deficiency anemia secondary to blood loss (chronic): Secondary | ICD-10-CM | POA: Diagnosis not present

## 2020-01-23 DIAGNOSIS — E6609 Other obesity due to excess calories: Secondary | ICD-10-CM

## 2020-01-23 DIAGNOSIS — E669 Obesity, unspecified: Secondary | ICD-10-CM | POA: Insufficient documentation

## 2020-01-23 NOTE — Assessment & Plan Note (Signed)
Recommended eating smaller high protein, low fat meals more frequently and exercising 30 mins a day 5 times a week with a goal of 10-15lb weight loss in the next 3 months. Patient voiced their understanding and motivation to adhere to these recommendations.  

## 2020-01-23 NOTE — Assessment & Plan Note (Signed)
Overall symptom improvement since surgery, no further bleeding.

## 2020-01-23 NOTE — Assessment & Plan Note (Signed)
Chronic, stable.  Denies SI/HI.  Continue current dose of Celexa and adjust as needed.   Refills provided last visit.  Return in 6 months.

## 2020-01-23 NOTE — Patient Instructions (Signed)
Anemia  Anemia is a condition in which you do not have enough red blood cells or hemoglobin. Hemoglobin is a substance in red blood cells that carries oxygen. When you do not have enough red blood cells or hemoglobin (are anemic), your body cannot get enough oxygen and your organs may not work properly. As a result, you may feel very tired or have other problems. What are the causes? Common causes of anemia include:  Excessive bleeding. Anemia can be caused by excessive bleeding inside or outside the body, including bleeding from the intestine or from periods in women.  Poor nutrition.  Long-lasting (chronic) kidney, thyroid, and liver disease.  Bone marrow disorders.  Cancer and treatments for cancer.  HIV (human immunodeficiency virus) and AIDS (acquired immunodeficiency syndrome).  Treatments for HIV and AIDS.  Spleen problems.  Blood disorders.  Infections, medicines, and autoimmune disorders that destroy red blood cells. What are the signs or symptoms? Symptoms of this condition include:  Minor weakness.  Dizziness.  Headache.  Feeling heartbeats that are irregular or faster than normal (palpitations).  Shortness of breath, especially with exercise.  Paleness.  Cold sensitivity.  Indigestion.  Nausea.  Difficulty sleeping.  Difficulty concentrating. Symptoms may occur suddenly or develop slowly. If your anemia is mild, you may not have symptoms. How is this diagnosed? This condition is diagnosed based on:  Blood tests.  Your medical history.  A physical exam.  Bone marrow biopsy. Your health care provider may also check your stool (feces) for blood and may do additional testing to look for the cause of your bleeding. You may also have other tests, including:  Imaging tests, such as a CT scan or MRI.  Endoscopy.  Colonoscopy. How is this treated? Treatment for this condition depends on the cause. If you continue to lose a lot of blood, you may  need to be treated at a hospital. Treatment may include:  Taking supplements of iron, vitamin S31, or folic acid.  Taking a hormone medicine (erythropoietin) that can help to stimulate red blood cell growth.  Having a blood transfusion. This may be needed if you lose a lot of blood.  Making changes to your diet.  Having surgery to remove your spleen. Follow these instructions at home:  Take over-the-counter and prescription medicines only as told by your health care provider.  Take supplements only as told by your health care provider.  Follow any diet instructions that you were given.  Keep all follow-up visits as told by your health care provider. This is important. Contact a health care provider if:  You develop new bleeding anywhere in the body. Get help right away if:  You are very weak.  You are short of breath.  You have pain in your abdomen or chest.  You are dizzy or feel faint.  You have trouble concentrating.  You have bloody or black, tarry stools.  You vomit repeatedly or you vomit up blood. Summary  Anemia is a condition in which you do not have enough red blood cells or enough of a substance in your red blood cells that carries oxygen (hemoglobin).  Symptoms may occur suddenly or develop slowly.  If your anemia is mild, you may not have symptoms.  This condition is diagnosed with blood tests as well as a medical history and physical exam. Other tests may be needed.  Treatment for this condition depends on the cause of the anemia. This information is not intended to replace advice given to you by  your health care provider. Make sure you discuss any questions you have with your health care provider. Document Revised: 06/29/2017 Document Reviewed: 08/18/2016 Elsevier Patient Education  [place].

## 2020-01-23 NOTE — Assessment & Plan Note (Signed)
Overall improved symptoms since surgery for hemorrhoids.  Check CBC and iron/ferritin today.  Continue daily supplement, if labs improved will discontinued this.

## 2020-01-23 NOTE — Progress Notes (Signed)
BP 124/75   Pulse 79   Temp 98.3 F (36.8 C) (Oral)   Wt 225 lb 9.6 oz (102.3 kg)   SpO2 98%   BMI 32.37 kg/m    Subjective:    Patient ID: David Wong, male    DOB: 1986-03-23, 34 y.o.   MRN: [id number]  HPI: David Wong is a 34 y.o. male  Chief Complaint  Patient presents with  . Depression   DEPRESSION Taking Celexa 20 MG daily.   Mood status: stable Satisfied with current treatment?: yes Symptom severity: mild  Duration of current treatment : chronic Side effects: no Medication compliance: good compliance Psychotherapy/counseling: none Depressed mood: occasional Anxious mood: no Anhedonia: no Significant weight loss or gain: no Insomnia: occasional Fatigue: no Feelings of worthlessness or guilt: no Impaired concentration/indecisiveness: no Suicidal ideations: no Hopelessness: no Crying spells: no Depression screen [facility] 2/9 01/23/2020 09/01/2019 07/18/2018 06/29/2017 07/12/2015  Decreased Interest 0 1 0 1 0  Down, Depressed, Hopeless 1 0 1 2 1   PHQ - 2 Score 1 1 1 3 1   Altered sleeping 1 1 1 2  -  Tired, decreased energy 0 0 1 3 -  Change in appetite 0 0 0 1 -  Feeling bad or failure about yourself  0 1 0 1 -  Trouble concentrating 0 0 0 0 -  Moving slowly or fidgety/restless 0 0 0 0 -  Suicidal thoughts 0 0 0 0 -  PHQ-9 Score 2 3 3 10  -  Difficult doing work/chores Not difficult at all Not difficult at all Not difficult at all - -   ANEMIA Had hemorrhoid surgery on 11/24/19.  He reports no further rectal bleeding. Anemia status: improved -- no further rectal bleeding Compliance with treatment: fair compliance Iron supplementation side effects: no Severity of anemia: moderate Fatigue: no Decreased exercise tolerance: no  Dyspnea on exertion: no Palpitations: no Bleeding: no Pica: no  Relevant past medical, surgical, family and social history reviewed and updated as indicated. Interim medical history since our last visit reviewed. Allergies  and medications reviewed and updated.  Review of Systems  Constitutional: Negative for activity change, diaphoresis, fatigue and fever.  Respiratory: Negative for cough, chest tightness, shortness of breath and wheezing.   Cardiovascular: Negative for chest pain, palpitations and leg swelling.  Gastrointestinal: Negative for anal bleeding.  Endocrine: Negative for cold intolerance, heat intolerance, polydipsia, polyphagia and polyuria.  Neurological: Negative.   Psychiatric/Behavioral: Negative.     Per HPI unless specifically indicated above     Objective:    BP 124/75   Pulse 79   Temp 98.3 F (36.8 C) (Oral)   Wt 225 lb 9.6 oz (102.3 kg)   SpO2 98%   BMI 32.37 kg/m   Wt Readings from Last 3 Encounters:  01/23/20 225 lb 9.6 oz (102.3 kg)  12/16/19 221 lb 12.8 oz (100.6 kg)  12/02/19 221 lb 6.4 oz (100.4 kg)    Physical Exam Vitals and nursing note reviewed.  Constitutional:      General: He is awake. He is not in acute distress.    Appearance: He is well-developed, well-groomed and overweight. He is not ill-appearing.  HENT:     Head: Normocephalic and atraumatic.     Right Ear: Hearing normal. No drainage.     Left Ear: Hearing normal. No drainage.  Eyes:     General: Lids are normal.        Right eye: No discharge.  Left eye: No discharge.     Conjunctiva/sclera: Conjunctivae normal.     Pupils: Pupils are equal, round, and reactive to light.  Neck:     Thyroid: No thyromegaly.     Vascular: No carotid bruit.  Cardiovascular:     Rate and Rhythm: Normal rate and regular rhythm.     Heart sounds: Normal heart sounds, S1 normal and S2 normal. No murmur heard.  No gallop.   Pulmonary:     Effort: Pulmonary effort is normal. No accessory muscle usage or respiratory distress.     Breath sounds: Normal breath sounds.  Abdominal:     General: Bowel sounds are normal.     Palpations: Abdomen is soft.     Tenderness: There is no abdominal tenderness.    Musculoskeletal:        General: Normal range of motion.     Cervical back: Normal range of motion and neck supple.     Right lower leg: No edema.     Left lower leg: No edema.  Skin:    General: Skin is warm and dry.  Neurological:     Mental Status: He is alert and oriented to person, place, and time.  Psychiatric:        Attention and Perception: Attention normal.        Mood and Affect: Mood normal.        Speech: Speech normal.        Behavior: Behavior normal. Behavior is cooperative.        Thought Content: Thought content normal.    Results for orders placed or performed during the hospital encounter of 11/24/19  CBC  Result Value Ref Range   WBC 12.1 (H) 4.0 - 10.5 K/uL   RBC 3.96 (L) 4.22 - 5.81 MIL/uL   Hemoglobin 8.9 (L) 13.0 - 17.0 g/dL   HCT 30.6 (L) 39 - 52 %   MCV 77.3 (L) 80.0 - 100.0 fL   MCH 22.5 (L) 26.0 - 34.0 pg   MCHC 29.1 (L) 30.0 - 36.0 g/dL   RDW 17.1 (H) 11.5 - 15.5 %   Platelets 236 150 - 400 K/uL   nRBC 0.0 0.0 - 0.2 %  Comprehensive metabolic panel  Result Value Ref Range   Sodium 139 135 - 145 mmol/L   Potassium 3.9 3.5 - 5.1 mmol/L   Chloride 112 (H) 98 - 111 mmol/L   CO2 22 22 - 32 mmol/L   Glucose, Bld 112 (H) 70 - 99 mg/dL   BUN 17 6 - 20 mg/dL   Creatinine, Ser 1.03 0.61 - 1.24 mg/dL   Calcium 8.1 (L) 8.9 - 10.3 mg/dL   Total Protein 7.2 6.5 - 8.1 g/dL   Albumin 3.7 3.5 - 5.0 g/dL   AST 24 15 - 41 U/L   ALT 32 0 - 44 U/L   Alkaline Phosphatase 64 38 - 126 U/L   Total Bilirubin 0.4 0.3 - 1.2 mg/dL   GFR calc non Af Amer >60 >60 mL/min   GFR calc Af Amer >60 >60 mL/min   Anion gap 5 5 - 15  Lipase, blood  Result Value Ref Range   Lipase 28 11 - 51 U/L      Assessment & Plan:   Problem List Items Addressed This Visit      Cardiovascular and Mediastinum   Hemorrhoids    Overall symptom improvement since surgery, no further bleeding.        Other   Anxiety  Chronic, ongoing.  Denies SI/HI.  Continue current  medication regimen and adjust as needed.  Refills sent in January.  Return in 6 months.      Depression, major, single episode, in partial remission ([place]) - Primary    Chronic, stable.  Denies SI/HI.  Continue current dose of Celexa and adjust as needed.   Refills provided last visit.  Return in 6 months.      Iron deficiency anemia    Overall improved symptoms since surgery for hemorrhoids.  Check CBC and iron/ferritin today.  Continue daily supplement, if labs improved will discontinued this.      Relevant Orders   Iron, TIBC and Ferritin Panel   CBC with Differential/Platelet   Basic metabolic panel   Obesity    Recommended eating smaller high protein, low fat meals more frequently and exercising 30 mins a day 5 times a week with a goal of 10-15lb weight loss in the next 3 months. Patient voiced their understanding and motivation to adhere to these recommendations.           Follow up plan: Return in about 6 months (around 07/24/2020) for Mood.

## 2020-01-23 NOTE — Assessment & Plan Note (Signed)
Chronic, ongoing.  Denies SI/HI.  Continue current medication regimen and adjust as needed.  Refills sent in January.  Return in 6 months.

## 2020-01-24 LAB — CBC WITH DIFFERENTIAL/PLATELET
Basophils Absolute: 0.1 10*3/uL (ref 0.0–0.2)
Basos: 1 %
EOS (ABSOLUTE): 0.4 10*3/uL (ref 0.0–0.4)
Eos: 6 %
Hematocrit: 34.7 % — ABNORMAL LOW (ref 37.5–51.0)
Hemoglobin: 10.1 g/dL — ABNORMAL LOW (ref 13.0–17.7)
Immature Grans (Abs): 0 10*3/uL (ref 0.0–0.1)
Immature Granulocytes: 0 %
Lymphocytes Absolute: 2.3 10*3/uL (ref 0.7–3.1)
Lymphs: 34 %
MCH: 20.6 pg — ABNORMAL LOW (ref 26.6–33.0)
MCHC: 29.1 g/dL — ABNORMAL LOW (ref 31.5–35.7)
MCV: 71 fL — ABNORMAL LOW (ref 79–97)
Monocytes Absolute: 0.4 10*3/uL (ref 0.1–0.9)
Monocytes: 6 %
Neutrophils Absolute: 3.5 10*3/uL (ref 1.4–7.0)
Neutrophils: 53 %
Platelets: 269 10*3/uL (ref 150–450)
RBC: 4.91 x10E6/uL (ref 4.14–5.80)
RDW: 15.7 % — ABNORMAL HIGH (ref 11.6–15.4)
WBC: 6.6 10*3/uL (ref 3.4–10.8)

## 2020-01-24 LAB — IRON,TIBC AND FERRITIN PANEL
Ferritin: 5 ng/mL — ABNORMAL LOW (ref 30–400)
Iron Saturation: 6 % — CL (ref 15–55)
Iron: 22 ug/dL — ABNORMAL LOW (ref 38–169)
Total Iron Binding Capacity: 400 ug/dL (ref 250–450)
UIBC: 378 ug/dL — ABNORMAL HIGH (ref 111–343)

## 2020-01-24 LAB — BASIC METABOLIC PANEL
BUN/Creatinine Ratio: 11 (ref 9–20)
BUN: 12 mg/dL (ref 6–20)
CO2: 19 mmol/L — ABNORMAL LOW (ref 20–29)
Calcium: 9.6 mg/dL (ref 8.7–10.2)
Chloride: 107 mmol/L — ABNORMAL HIGH (ref 96–106)
Creatinine, Ser: 1.09 mg/dL (ref 0.76–1.27)
GFR calc Af Amer: 103 mL/min/{1.73_m2} (ref >59)
GFR calc non Af Amer: 89 mL/min/{1.73_m2} (ref >59)
Glucose: 124 mg/dL — ABNORMAL HIGH (ref 65–99)
Potassium: 4.3 mmol/L (ref 3.5–5.2)
Sodium: 140 mmol/L (ref 134–144)

## 2020-01-25 NOTE — Progress Notes (Signed)
Good morning, please let David Wong know his labs have returned.  Iron level continues to be on lower side, but hemoglobin and hematocrit are improving.  I do recommend a referral to hematology, I think he may benefit from some IV iron until levels improve and then could transition to oral.  Would he like this referral?  Please let me know and I can place.  Thanks.

## 2020-01-26 ENCOUNTER — Other Ambulatory Visit: Payer: Self-pay | Admitting: Nurse Practitioner

## 2020-01-26 DIAGNOSIS — D500 Iron deficiency anemia secondary to blood loss (chronic): Secondary | ICD-10-CM

## 2020-02-03 ENCOUNTER — Encounter: Payer: Self-pay | Admitting: Oncology

## 2020-02-03 ENCOUNTER — Inpatient Hospital Stay: Payer: BC Managed Care – PPO | Attending: Oncology | Admitting: Oncology

## 2020-02-03 ENCOUNTER — Inpatient Hospital Stay: Payer: BC Managed Care – PPO

## 2020-02-03 ENCOUNTER — Other Ambulatory Visit: Payer: Self-pay

## 2020-02-03 VITALS — BP 119/78 | HR 79 | Temp 97.0°F | Resp 18 | Ht 70.0 in | Wt 226.2 lb

## 2020-02-03 DIAGNOSIS — K625 Hemorrhage of anus and rectum: Secondary | ICD-10-CM

## 2020-02-03 DIAGNOSIS — Z79899 Other long term (current) drug therapy: Secondary | ICD-10-CM

## 2020-02-03 DIAGNOSIS — Z8249 Family history of ischemic heart disease and other diseases of the circulatory system: Secondary | ICD-10-CM | POA: Diagnosis not present

## 2020-02-03 DIAGNOSIS — Z8349 Family history of other endocrine, nutritional and metabolic diseases: Secondary | ICD-10-CM | POA: Diagnosis not present

## 2020-02-03 DIAGNOSIS — K5900 Constipation, unspecified: Secondary | ICD-10-CM | POA: Diagnosis not present

## 2020-02-03 DIAGNOSIS — R5383 Other fatigue: Secondary | ICD-10-CM

## 2020-02-03 DIAGNOSIS — Z8719 Personal history of other diseases of the digestive system: Secondary | ICD-10-CM | POA: Insufficient documentation

## 2020-02-03 DIAGNOSIS — K649 Unspecified hemorrhoids: Secondary | ICD-10-CM | POA: Insufficient documentation

## 2020-02-03 DIAGNOSIS — D500 Iron deficiency anemia secondary to blood loss (chronic): Secondary | ICD-10-CM | POA: Insufficient documentation

## 2020-02-03 DIAGNOSIS — Z833 Family history of diabetes mellitus: Secondary | ICD-10-CM | POA: Diagnosis not present

## 2020-02-03 DIAGNOSIS — F329 Major depressive disorder, single episode, unspecified: Secondary | ICD-10-CM

## 2020-02-03 DIAGNOSIS — Z8261 Family history of arthritis: Secondary | ICD-10-CM | POA: Diagnosis not present

## 2020-02-03 DIAGNOSIS — Z803 Family history of malignant neoplasm of breast: Secondary | ICD-10-CM

## 2020-02-03 DIAGNOSIS — D509 Iron deficiency anemia, unspecified: Secondary | ICD-10-CM

## 2020-02-03 HISTORY — DX: Iron deficiency anemia, unspecified: D50.9

## 2020-02-03 NOTE — Progress Notes (Signed)
Hematology/Oncology Consult note [facility] Telephone:([phone] [phone]   Patient Care Team: Venita Lick, NP as PCP - General (Nurse Practitioner)  REFERRING PROVIDER: Venita Lick, NP CHIEF COMPLAINTS/REASON FOR VISIT:  Evaluation of iron deficiency anemia  HISTORY OF PRESENTING ILLNESS:  David Wong is a  34 y.o.  male with PMH listed below was seen in consultation at the request of Marnee Guarneri T, NP   for evaluation of iron deficiency anemia.   Reviewed patient's recent labs  01/23/2020 labs revealed anemia with hemoglobin of 10.1, MCV 71, iron panel showed iron saturation decreased at 6, ferritin is 5, TIBC is 400. Reviewed patient's previous labs ordered by primary care physician's office, anemia is chronic onset , duration is since 2019.   Associated signs and symptoms: Patient reports fatigue.  Denies SOB with exertion.  Denies weight loss, easy bruising, hematochezia, hemoptysis, hematuria. Context:  History of iron deficiency: Longstanding iron deficiency.  Currently patient is only taking multivitamin.  He is not able to tolerate oral iron supplementation due to constipation. Rectal bleeding: Patient has history of rectal bleeding due to hemorrhoids.  Recently status post hemorrhoid surgery. 10/08/2018 upper endoscopy showed LA grade C reflux esophagitis.  Duodenum biopsy showed enteric mucosa with preserved villous architecture.  No dysplasia, malignancy.  Stomach biopsy showed chronic minimally active gastritis.  Negative for dyspepsia, malignancy or H. pylori. 10/08/2018 colonoscopy showed nonbleeding internal hemorrhoids.  Source of rectal bleeding/IDA.  Examination was otherwise normal.  Hematemesis or hemoptysis : denies Blood in urine : denies      Review of Systems  Constitutional: Positive for fatigue. Negative for appetite change, chills, fever and unexpected weight change.  HENT:   Negative for hearing  loss and voice change.   Eyes: Negative for eye problems and icterus.  Respiratory: Negative for chest tightness, cough and shortness of breath.   Cardiovascular: Negative for chest pain and leg swelling.  Gastrointestinal: Negative for abdominal distention and abdominal pain.  Endocrine: Negative for hot flashes.  Genitourinary: Negative for difficulty urinating, dysuria and frequency.   Musculoskeletal: Negative for arthralgias.  Skin: Negative for itching and rash.  Neurological: Negative for light-headedness and numbness.  Hematological: Negative for adenopathy. Does not bruise/bleed easily.  Psychiatric/Behavioral: Negative for confusion.    MEDICAL HISTORY:  Past Medical History:  Diagnosis Date  . Anemia   . Anxiety   . Depression   . GERD (gastroesophageal reflux disease)    occ   . Sleep apnea    does not have cpap    SURGICAL HISTORY: Past Surgical History:  Procedure Laterality Date  . COLONOSCOPY WITH PROPOFOL N/A 10/08/2018   Procedure: COLONOSCOPY WITH PROPOFOL;  Surgeon: Lin Landsman, MD;  Location: [facility] ENDOSCOPY;  Service: Gastroenterology;  Laterality: N/A;  . ESOPHAGOGASTRODUODENOSCOPY (EGD) WITH PROPOFOL N/A 10/08/2018   Procedure: ESOPHAGOGASTRODUODENOSCOPY (EGD) WITH PROPOFOL;  Surgeon: Lin Landsman, MD;  Location[facility] ENDOSCOPY;  Service: Gastroenterology;  Laterality: N/A;  . HEMORRHOID SURGERY N/A 11/17/2019   Procedure: [place] Internal [facility] External;  Surgeon: Ronny Bacon, MD;  Location: [facility] ORS;  Service: General;  Laterality: N/A;  . right pinky finger  2013   pins inserted    SOCIAL HISTORY: Social History   Socioeconomic History  . Marital status: Single    Spouse name: Not on file  . Number of children: Not on file  . Years of education: Not on file  . Highest education level: Not on file  Occupational History  .  Not on file  Tobacco Use  . Smoking status: Never Smoker  . Smokeless tobacco: Never Used  Vaping  Use  . Vaping Use: Every day  . Start date: 02/03/2016  Substance and Sexual Activity  . Alcohol use: Yes    Alcohol/week: 0.0 standard drinks    Comment: Rarely  . Drug use: No  . Sexual activity: Yes    Partners: Female    Comment: Fiance on birth control  Other Topics Concern  . Not on file  Social History Narrative  . Not on file   Social Determinants of Health   Financial Resource Strain:   . Difficulty of Paying Living Expenses:   Food Insecurity:   . Worried About Charity fundraiser in the Last Year:   . Arboriculturist in the Last Year:   Transportation Needs:   . Film/video editor (Medical):   Marland Kitchen Lack of Transportation (Non-Medical):   Physical Activity:   . Days of Exercise per Week:   . Minutes of Exercise per Session:   Stress:   . Feeling of Stress :   Social Connections:   . Frequency of Communication with Friends and Family:   . Frequency of Social Gatherings with Friends and Family:   . Attends Religious Services:   . Active Member of Clubs or Organizations:   . Attends Archivist Meetings:   Marland Kitchen Marital Status:   Intimate Partner Violence:   . Fear of Current or Ex-Partner:   . Emotionally Abused:   Marland Kitchen Physically Abused:   . Sexually Abused:     FAMILY HISTORY: Family History  Problem Relation Age of Onset  . Hyperlipidemia Mother   . Hypertension Mother   . Hyperlipidemia Father   . Diabetes Father   . Hypertension Brother   . Diabetes Brother   . Arthritis Maternal Grandfather        RA  . Heart disease Paternal Grandfather   . Breast cancer Maternal Aunt     ALLERGIES:  has No Known Allergies.  MEDICATIONS:  Current Outpatient Medications  Medication Sig Dispense Refill  . calcium carbonate (TUMS - DOSED IN MG ELEMENTAL CALCIUM) 500 MG chewable tablet Chew 1 tablet by mouth as needed for indigestion or heartburn.    . citalopram (CELEXA) 20 MG tablet Take 1 tablet (20 mg total) by mouth daily. 90 tablet 3  . Multiple  Vitamins-Iron (MULTIVITAMIN/IRON PO) Take 1 tablet by mouth daily.     No current facility-administered medications for this visit.     PHYSICAL EXAMINATION: ECOG PERFORMANCE STATUS: 1 - Symptomatic but completely ambulatory Vitals:   02/03/20 1038  BP: 119/78  Pulse: 79  Resp: 18  Temp: (!) 97 F (36.1 C)   Filed Weights   02/03/20 1038  Weight: 226 lb 3.2 oz (102.6 kg)    Physical Exam Constitutional:      General: He is not in acute distress. HENT:     Head: Normocephalic and atraumatic.  Eyes:     General: No scleral icterus. Cardiovascular:     Rate and Rhythm: Normal rate and regular rhythm.     Heart sounds: Normal heart sounds.  Pulmonary:     Effort: Pulmonary effort is normal. No respiratory distress.     Breath sounds: No wheezing.  Abdominal:     General: Bowel sounds are normal. There is no distension.     Palpations: Abdomen is soft.  Musculoskeletal:        General:  No deformity. Normal range of motion.     Cervical back: Normal range of motion and neck supple.  Skin:    General: Skin is warm and dry.     Findings: No erythema or rash.  Neurological:     Mental Status: He is alert and oriented to person, place, and time. Mental status is at baseline.     Cranial Nerves: No cranial nerve deficit.     Coordination: Coordination normal.  Psychiatric:        Mood and Affect: Mood normal.       CMP Latest Ref Rng & Units 01/23/2020  Glucose 65 - 99 mg/dL 124(H)  BUN 6 - 20 mg/dL 12  Creatinine 0.76 - 1.27 mg/dL 1.09  Sodium 134 - 144 mmol/L 140  Potassium 3.5 - 5.2 mmol/L 4.3  Chloride 96 - 106 mmol/L 107(H)  CO2 20 - 29 mmol/L 19(L)  Calcium 8.7 - 10.2 mg/dL 9.6  Total Protein 6.5 - 8.1 g/dL -  Total Bilirubin 0.3 - 1.2 mg/dL -  Alkaline Phos 38 - 126 U/L -  AST 15 - 41 U/L -  ALT 0 - 44 U/L -   CBC Latest Ref Rng & Units 01/23/2020  WBC 3.4 - 10.8 x10E3/uL 6.6  Hemoglobin 13.0 - 17.7 g/dL 10.1(L)  Hematocrit 37.5 - 51.0 % 34.7(L)    Platelets 150 - 450 x10E3/uL 269     LABORATORY DATA:  I have reviewed the data as listed Lab Results  Component Value Date   WBC 6.6 01/23/2020   HGB 10.1 (L) 01/23/2020   HCT 34.7 (L) 01/23/2020   MCV 71 (L) 01/23/2020   PLT 269 01/23/2020   Recent Labs    11/22/19 2001 11/22/19 2001 11/23/19 0546 11/24/19 1712 01/23/20 0906  NA 138   < > 137 139 140  K 4.0   < > 4.1 3.9 4.3  CL 106   < > 107 112* 107*  CO2 20*   < > 22 22 19*  GLUCOSE 107*   < > 99 112* 124*  BUN 14   < > 13 17 12   CREATININE 1.15   < > 1.00 1.03 1.09  CALCIUM 9.2   < > 8.6* 8.1* 9.6  GFRNONAA >60   < > >60 >60 89  GFRAA >60   < > >60 >60 103  PROT 8.1  --  6.3* 7.2  --   ALBUMIN 4.1  --  3.4* 3.7  --   AST 27  --  22 24  --   ALT 41  --  33 32  --   ALKPHOS 66  --  57 64  --   BILITOT 0.6  --  0.6 0.4  --    < > = values in this interval not displayed.   Iron/TIBC/Ferritin/ %Sat    Component Value Date/Time   IRON 22 (L) 01/23/2020 0906   TIBC 400 01/23/2020 0906   FERRITIN 5 (L) 01/23/2020 0906   IRONPCTSAT 6 (LL) 01/23/2020 0906     RADIOGRAPHIC STUDIES: I have personally reviewed the radiological images as listed and agreed with the findings in the report. No results found.     ASSESSMENT & PLAN:  1. Iron deficiency anemia due to chronic blood loss    Labs are reviewed and discussed with patient. Consistent with iron deficiency anemia. Patient has tried oral iron supplementation previously and cannot tolerate due to constipation which makes his hemorrhoids bleeding worse.  We discussed about option  of IV iron infusions. Plan IV iron with Feraheme 500mg  weekly x 2. Allergy reactions/infusion reaction including anaphylactic reaction discussed with patient. Other side effects include but not limited to high blood pressure, skin rash, weight gain, leg swelling, etc. Patient voices understanding and willing to proceed. .  Etiology of iron deficiency likely secondary to blood loss from  hemorrhoid.  He has had upper endoscopy and colonoscopy done in the past.  Patient will follow up in 3 months for reevaluation of need of treatment response Orders Placed This Encounter  Procedures  . CBC with Differential/Platelet    Standing Status:   Future    Standing Expiration Date:   02/02/2021  . Ferritin    Standing Status:   Future    Standing Expiration Date:   02/02/2021  . Iron and TIBC    Standing Status:   Future    Standing Expiration Date:   02/02/2021    All questions were answered. The patient knows to call the clinic with any problems questions or concerns.  Cc Marnee Guarneri T, NP  Return of visit: 3 months Thank you for this kind referral and the opportunity to participate in the care of this patient. A copy of today's note is routed to referring provider   Earlie Server, MD, PhD Hematology Oncology [place] at [facility] 02/03/2020

## 2020-02-03 NOTE — Progress Notes (Signed)
Pt here for follow up. Pt reports having some nausea and vomiting. Also having bowel problem, alternating between diarrhea and constipation.

## 2020-02-17 ENCOUNTER — Other Ambulatory Visit: Payer: Self-pay

## 2020-02-17 ENCOUNTER — Inpatient Hospital Stay: Payer: BC Managed Care – PPO

## 2020-02-17 VITALS — BP 119/73 | HR 71

## 2020-02-17 DIAGNOSIS — Z79899 Other long term (current) drug therapy: Secondary | ICD-10-CM | POA: Diagnosis not present

## 2020-02-17 DIAGNOSIS — D500 Iron deficiency anemia secondary to blood loss (chronic): Secondary | ICD-10-CM

## 2020-02-17 DIAGNOSIS — K5900 Constipation, unspecified: Secondary | ICD-10-CM | POA: Diagnosis not present

## 2020-02-17 DIAGNOSIS — Z8349 Family history of other endocrine, nutritional and metabolic diseases: Secondary | ICD-10-CM | POA: Diagnosis not present

## 2020-02-17 DIAGNOSIS — Z803 Family history of malignant neoplasm of breast: Secondary | ICD-10-CM | POA: Diagnosis not present

## 2020-02-17 DIAGNOSIS — Z833 Family history of diabetes mellitus: Secondary | ICD-10-CM | POA: Diagnosis not present

## 2020-02-17 DIAGNOSIS — Z8261 Family history of arthritis: Secondary | ICD-10-CM | POA: Diagnosis not present

## 2020-02-17 DIAGNOSIS — K649 Unspecified hemorrhoids: Secondary | ICD-10-CM | POA: Diagnosis not present

## 2020-02-17 DIAGNOSIS — Z8719 Personal history of other diseases of the digestive system: Secondary | ICD-10-CM | POA: Diagnosis not present

## 2020-02-17 DIAGNOSIS — K625 Hemorrhage of anus and rectum: Secondary | ICD-10-CM | POA: Diagnosis not present

## 2020-02-17 DIAGNOSIS — R5383 Other fatigue: Secondary | ICD-10-CM | POA: Diagnosis not present

## 2020-02-17 DIAGNOSIS — F329 Major depressive disorder, single episode, unspecified: Secondary | ICD-10-CM | POA: Diagnosis not present

## 2020-02-17 DIAGNOSIS — Z8249 Family history of ischemic heart disease and other diseases of the circulatory system: Secondary | ICD-10-CM | POA: Diagnosis not present

## 2020-02-17 MED ORDER — SODIUM CHLORIDE 0.9 % IV SOLN
Freq: Once | INTRAVENOUS | Status: AC
  Filled 2020-02-17: qty 250

## 2020-02-17 MED ORDER — SODIUM CHLORIDE 0.9 % IV SOLN
510.0000 mg | Freq: Once | INTRAVENOUS | Status: AC
  Administered 2020-02-17: 510 mg via INTRAVENOUS
  Filled 2020-02-17: qty 17

## 2020-02-24 ENCOUNTER — Other Ambulatory Visit: Payer: Self-pay

## 2020-02-24 ENCOUNTER — Inpatient Hospital Stay: Payer: BC Managed Care – PPO

## 2020-02-24 VITALS — BP 127/82 | HR 92 | Temp 98.0°F | Resp 20

## 2020-02-24 DIAGNOSIS — Z8249 Family history of ischemic heart disease and other diseases of the circulatory system: Secondary | ICD-10-CM | POA: Diagnosis not present

## 2020-02-24 DIAGNOSIS — K649 Unspecified hemorrhoids: Secondary | ICD-10-CM | POA: Diagnosis not present

## 2020-02-24 DIAGNOSIS — Z803 Family history of malignant neoplasm of breast: Secondary | ICD-10-CM | POA: Diagnosis not present

## 2020-02-24 DIAGNOSIS — Z8349 Family history of other endocrine, nutritional and metabolic diseases: Secondary | ICD-10-CM | POA: Diagnosis not present

## 2020-02-24 DIAGNOSIS — K625 Hemorrhage of anus and rectum: Secondary | ICD-10-CM | POA: Diagnosis not present

## 2020-02-24 DIAGNOSIS — R5383 Other fatigue: Secondary | ICD-10-CM | POA: Diagnosis not present

## 2020-02-24 DIAGNOSIS — K5900 Constipation, unspecified: Secondary | ICD-10-CM | POA: Diagnosis not present

## 2020-02-24 DIAGNOSIS — Z8719 Personal history of other diseases of the digestive system: Secondary | ICD-10-CM | POA: Diagnosis not present

## 2020-02-24 DIAGNOSIS — D500 Iron deficiency anemia secondary to blood loss (chronic): Secondary | ICD-10-CM | POA: Diagnosis not present

## 2020-02-24 DIAGNOSIS — Z8261 Family history of arthritis: Secondary | ICD-10-CM | POA: Diagnosis not present

## 2020-02-24 DIAGNOSIS — Z833 Family history of diabetes mellitus: Secondary | ICD-10-CM | POA: Diagnosis not present

## 2020-02-24 DIAGNOSIS — F329 Major depressive disorder, single episode, unspecified: Secondary | ICD-10-CM | POA: Diagnosis not present

## 2020-02-24 DIAGNOSIS — Z79899 Other long term (current) drug therapy: Secondary | ICD-10-CM | POA: Diagnosis not present

## 2020-02-24 MED ORDER — SODIUM CHLORIDE 0.9 % IV SOLN
Freq: Once | INTRAVENOUS | Status: AC
  Filled 2020-02-24: qty 250

## 2020-02-24 MED ORDER — SODIUM CHLORIDE 0.9 % IV SOLN
510.0000 mg | Freq: Once | INTRAVENOUS | Status: AC
  Administered 2020-02-24: 510 mg via INTRAVENOUS
  Filled 2020-02-24: qty 510

## 2020-03-01 ENCOUNTER — Ambulatory Visit: Payer: BC Managed Care – PPO | Admitting: Nurse Practitioner

## 2020-03-12 ENCOUNTER — Ambulatory Visit: Payer: BC Managed Care – PPO | Admitting: Nurse Practitioner

## 2020-04-27 ENCOUNTER — Ambulatory Visit
Admission: RE | Admit: 2020-04-27 | Discharge: 2020-04-27 | Disposition: A | Discharge status: Home / Self Care | Payer: BC Managed Care – PPO | Source: Ambulatory Visit | Attending: Family Medicine | Admitting: Family Medicine

## 2020-04-27 ENCOUNTER — Ambulatory Visit
Admission: RE | Admit: 2020-04-27 | Discharge: 2020-04-27 | Disposition: A | Discharge status: Home / Self Care | Payer: BC Managed Care – PPO | Attending: Family Medicine | Admitting: Family Medicine

## 2020-04-27 ENCOUNTER — Other Ambulatory Visit (HOSPITAL_COMMUNITY)
Admission: RE | Admit: 2020-04-27 | Discharge: 2020-04-27 | Disposition: A | Discharge status: Home / Self Care | Payer: BC Managed Care – PPO | Source: Ambulatory Visit | Attending: Family Medicine | Admitting: Family Medicine

## 2020-04-27 ENCOUNTER — Ambulatory Visit (INDEPENDENT_AMBULATORY_CARE_PROVIDER_SITE_OTHER): Payer: BC Managed Care – PPO | Admitting: Family Medicine

## 2020-04-27 ENCOUNTER — Other Ambulatory Visit: Payer: Self-pay

## 2020-04-27 ENCOUNTER — Encounter: Payer: Self-pay | Admitting: Family Medicine

## 2020-04-27 VITALS — BP 114/78 | HR 78 | Temp 98.3°F | Wt 232.0 lb

## 2020-04-27 DIAGNOSIS — M25532 Pain in left wrist: Secondary | ICD-10-CM

## 2020-04-27 DIAGNOSIS — D485 Neoplasm of uncertain behavior of skin: Secondary | ICD-10-CM

## 2020-04-27 DIAGNOSIS — D1801 Hemangioma of skin and subcutaneous tissue: Secondary | ICD-10-CM | POA: Diagnosis not present

## 2020-04-27 MED ORDER — NAPROXEN 500 MG PO TABS: 500.0000 mg | ORAL_TABLET | Freq: Two times a day (BID) | ORAL | 0 refills | Status: DC

## 2020-04-27 NOTE — Progress Notes (Signed)
BP 114/78   Pulse 78   Temp 98.3 F (36.8 C) (Oral)   Wt 232 lb (105.2 kg)   SpO2 99%   BMI 33.29 kg/m    Subjective:    Patient ID: David Wong, male    DOB: [birth year], 34 y.o.   MRN: [id number]  HPI: David Wong is a 34 y.o. male  Chief Complaint  Patient presents with  . Wrist Pain    left hand x about a weeks. pt states has enjured his wrist 20 years ago but pain is back  . Nevus    left arm, first noticed over a month ago   ARM PAIN- broke his arm 20 years ago near the elbow, but notes that his wrist has never been the same since then Duration: couple of weeks Location: L diffuse  Mechanism of injury: lifting Onset: sudden Severity: moderate  Quality:  Dull  Frequency: always there, worse after using it.  Radiation: no Aggravating factors: movement  Alleviating factors: brace  Status: worse Treatments attempted: brace and rest  Relief with NSAIDs?:  No NSAIDs Taken Swelling: no Redness: no  Warmth: no Trauma: no Chest pain: no  Shortness of breath: no  Fever: no Decreased sensation: no Paresthesias: no Weakness: yes with lifting  SKIN LESION Duration: about a month Location: L shoulder Painful: no Itching: no Onset: sudden Context: not changing Associated signs and symptoms:  History of skin cancer: no History of precancerous skin lesions: no   Relevant past medical, surgical, family and social history reviewed and updated as indicated. Interim medical history since our last visit reviewed. Allergies and medications reviewed and updated.  Review of Systems  Constitutional: Negative.   Respiratory: Negative.   Cardiovascular: Negative.   Gastrointestinal: Negative.   Musculoskeletal: Positive for arthralgias. Negative for back pain, gait problem, joint swelling, myalgias, neck pain and neck stiffness.  Skin: Positive for color change. Negative for pallor, rash and wound.  Neurological: Negative.   Psychiatric/Behavioral: Negative.      Per HPI unless specifically indicated above     Objective:    BP 114/78   Pulse 78   Temp 98.3 F (36.8 C) (Oral)   Wt 232 lb (105.2 kg)   SpO2 99%   BMI 33.29 kg/m   Wt Readings from Last 3 Encounters:  04/27/20 232 lb (105.2 kg)  02/03/20 226 lb 3.2 oz (102.6 kg)  01/23/20 225 lb 9.6 oz (102.3 kg)    Physical Exam Vitals and nursing note reviewed.  Constitutional:      General: He is not in acute distress.    Appearance: Normal appearance. He is not ill-appearing, toxic-appearing or diaphoretic.  HENT:     Head: Normocephalic and atraumatic.     Right Ear: External ear normal.     Left Ear: External ear normal.     Nose: Nose normal.     Mouth/Throat:     Mouth: Mucous membranes are moist.     Pharynx: Oropharynx is clear.  Eyes:     General: No scleral icterus.       Right eye: No discharge.        Left eye: No discharge.     Extraocular Movements: Extraocular movements intact.     Conjunctiva/sclera: Conjunctivae normal.     Pupils: Pupils are equal, round, and reactive to light.  Cardiovascular:     Rate and Rhythm: Normal rate and regular rhythm.     Pulses: Normal pulses.  Heart sounds: Normal heart sounds. No murmur heard.  No friction rub. No gallop.   Pulmonary:     Effort: Pulmonary effort is normal. No respiratory distress.     Breath sounds: Normal breath sounds. No stridor. No wheezing, rhonchi or rales.  Chest:     Chest wall: No tenderness.  Musculoskeletal:        General: Tenderness (L wrist) present. Normal range of motion.     Cervical back: Normal range of motion and neck supple.  Skin:    General: Skin is warm and dry.     Capillary Refill: Capillary refill takes less than 2 seconds.     Coloration: Skin is not jaundiced or pale.     Findings: No bruising, erythema, lesion or rash.     Comments: 42mm red raised irregular lesion on L shoulder  Neurological:     General: No focal deficit present.     Mental Status: He is alert and  oriented to person, place, and time. Mental status is at baseline.  Psychiatric:        Mood and Affect: Mood normal.        Behavior: Behavior normal.        Thought Content: Thought content normal.        Judgment: Judgment normal.     Results for orders placed or performed in visit on 01/23/20  Iron, TIBC and Ferritin Panel  Result Value Ref Range   Total Iron Binding Capacity 400 250 - 450 ug/dL   UIBC 378 (H) 111 - 343 ug/dL   Iron 22 (L) 38 - 169 ug/dL   Iron Saturation 6 (LL) 15 - 55 %   Ferritin 5 (L) 30.0 - 400.0 ng/mL  CBC with Differential/Platelet  Result Value Ref Range   WBC 6.6 3.4 - 10.8 x10E3/uL   RBC 4.91 4.14 - 5.80 x10E6/uL   Hemoglobin 10.1 (L) 13.0 - 17.7 g/dL   Hematocrit 34.7 (L) 37.5 - 51.0 %   MCV 71 (L) 79 - 97 fL   MCH 20.6 (L) 26.6 - 33.0 pg   MCHC 29.1 (L) 31 - 35 g/dL   RDW 15.7 (H) 11.6 - 15.4 %   Platelets 269 150 - 450 x10E3/uL   Neutrophils 53 Not Estab. %   Lymphs 34 Not Estab. %   Monocytes 6 Not Estab. %   Eos 6 Not Estab. %   Basos 1 Not Estab. %   Neutrophils Absolute 3.5 1 - 7 x10E3/uL   Lymphocytes Absolute 2.3 0 - 3 x10E3/uL   Monocytes Absolute 0.4 0 - 0 x10E3/uL   EOS (ABSOLUTE) 0.4 0.0 - 0.4 x10E3/uL   Basophils Absolute 0.1 0 - 0 x10E3/uL   Immature Granulocytes 0 Not Estab. %   Immature Grans (Abs) 0.0 0.0 - 0.1 [id number]/FI  Basic metabolic panel  Result Value Ref Range   Glucose 124 (H) 65 - 99 mg/dL   BUN 12 6 - 20 mg/dL   Creatinine, Ser 1.09 0.76 - 1.27 mg/dL   GFR calc non Af Amer 89 >59 mL/min/1.73   GFR calc Af Amer 103 >59 mL/min/1.73   BUN/Creatinine Ratio 11 9 - 20   Sodium 140 134 - 144 mmol/L   Potassium 4.3 3.5 - 5.2 mmol/L   Chloride 107 (H) 96 - 106 mmol/L   CO2 19 (L) 20 - 29 mmol/L   Calcium 9.6 8.7 - 10.2 mg/dL      Assessment & Plan:   Problem List Items Addressed  This Visit    None    Visit Diagnoses    Left wrist pain    -  Primary   Will obtain x-ray and start naproxen. Recheck 2 weeks.  Conitnue brace. Call with any concerns.   Relevant Orders   DG Wrist Complete Left   Neoplasm of uncertain behavior of skin       Removed today as below.    Relevant Orders   Surgical pathology      Skin Procedure  Procedure:    Informed consent given.  Sterile prep of the area.  Area infiltrated with lidocaine with epinephrine.  Using a surgical blade, part of the upper dermis shaved off and sent  for pathology.  Area cauterized. Pt ed on scarring  Diagnosis:   ICD-10-CM   1. Left wrist pain  M25.532 DG Wrist Complete Left   Will obtain x-ray and start naproxen. Recheck 2 weeks. Conitnue brace. Call with any concerns.  2. Neoplasm of uncertain behavior of skin  D48.5 Surgical pathology   Removed today as below.     Lesion Location/Size: 48mm red raised irregular lesion on L shoulder Physician: MJ Consent:  Risks, benefits, and alternative treatments discussed and all questions were answered.  Patient elected to proceed and verbal consent obtained.  Description: Area prepped and draped using semi-sterile technique. Area locally anesthetized using 3 cc's of lidocaine 1% with epi. Shave biopsy of lesion performed using a dermablade.  Adequate hemostastis achieved using Silver Nitrate. Wound dressed after application of bacitracin ointment.  Post Procedure Instructions: Wound care instructions discussed and patient was instructed to keep area clean and dry.  Signs and symptoms of infection discussed, patient agrees to contact the office ASAP should they occur.  Dressing change recommended daily.   Follow up plan: Return in about 2 weeks (around 05/11/2020).

## 2020-04-28 ENCOUNTER — Telehealth: Payer: Self-pay

## 2020-04-28 ENCOUNTER — Other Ambulatory Visit: Payer: Self-pay | Admitting: Oncology

## 2020-04-28 LAB — SURGICAL PATHOLOGY

## 2020-04-28 NOTE — Telephone Encounter (Signed)
-----   Message from [place], [place] sent at 04/28/2020  1:34 PM EDT ----- Regarding: RE: Feraheme Done...   LC ----- Message ----- From: Vanice Sarah, CMA Sent: 04/28/2020   1:27 PM EDT To: Maryann Conners, NT Subject: FW: Feraheme                                    ----- Message ----- From: Earlie Server, MD Sent: 04/28/2020  12:03 PM EDT To: Evelina Dun, RN, Vanice Sarah, CMA, # Subject: RE: Shirlean Kelly                                   Changed. Team please change his next appt to be lab md +/-venfoer. Thanks.  ----- Message ----- From: Floy Sabina Sent: 04/28/2020  11:56 AM EDT To: Earlie Server, MD Subject: Shirlean Kelly                                       As of 04/30/2020, feraheme is no longer the preferred drug for [employer].  Please change to ferrlecit or venofer.  Thanks, Lu

## 2020-05-03 ENCOUNTER — Telehealth: Payer: Self-pay | Admitting: Oncology

## 2020-05-03 NOTE — Telephone Encounter (Signed)
05/03/2020  Pt called and wanted to cxl his appts for 05/04/20 and 05/05/20. Says he is seeing his PCP on 05/18/20 and would like to follow up with him first before doing another iron infusion  srw

## 2020-05-04 ENCOUNTER — Inpatient Hospital Stay: Payer: BC Managed Care – PPO | Admitting: Oncology

## 2020-05-04 ENCOUNTER — Inpatient Hospital Stay: Payer: BC Managed Care – PPO

## 2020-05-05 ENCOUNTER — Inpatient Hospital Stay: Payer: BC Managed Care – PPO

## 2020-05-18 ENCOUNTER — Other Ambulatory Visit: Payer: Self-pay

## 2020-05-18 ENCOUNTER — Ambulatory Visit (INDEPENDENT_AMBULATORY_CARE_PROVIDER_SITE_OTHER): Payer: BC Managed Care – PPO | Admitting: Nurse Practitioner

## 2020-05-18 ENCOUNTER — Encounter: Payer: Self-pay | Admitting: Nurse Practitioner

## 2020-05-18 VITALS — BP 127/87 | HR 90 | Temp 98.0°F | Resp 16 | Wt 231.0 lb

## 2020-05-18 DIAGNOSIS — M25532 Pain in left wrist: Secondary | ICD-10-CM | POA: Diagnosis not present

## 2020-05-18 NOTE — Patient Instructions (Signed)
Wrist Pain, Adult There are many things that can cause wrist pain. Some common causes include:  An injury to the wrist area, such as a sprain, strain, or fracture.  Overuse of the joint.  A condition that causes increased pressure on a nerve in the wrist (carpal tunnel syndrome).  Wear and tear of the joints that occurs with aging (osteoarthritis).  A variety of other types of arthritis. Sometimes, the cause of wrist pain is not known. Often, the pain goes away when you follow instructions from your health care provider for relieving pain at home, such as resting or icing the wrist. If your wrist pain continues, it is important to tell your health care provider. Follow these instructions at home:  Rest the wrist area for at least 48 hours or as long as told by your health care provider.  If a splint or elastic bandage has been applied, use it as told by your health care provider. ? Remove the splint or bandage only as told by your health care provider. ? Loosen the splint or bandage if your fingers tingle, become numb, or turn cold or blue.  If directed, apply ice to the injured area. ? If you have a removable splint or elastic bandage, remove it as told by your health care provider. ? Put ice in a plastic bag. ? Place a towel between your skin and the bag or between your splint or bandage and the bag. ? Leave the ice on for 20 minutes, 2-3 times a day.   Keep your arm raised (elevated) above the level of your heart while you are sitting or lying down.  Take over-the-counter and prescription medicines only as told by your health care provider.  Keep all follow-up visits as told by your health care provider. This is important. Contact a health care provider if:  You have a sudden sharp pain in the wrist, hand, or arm that is different or new.  The swelling or bruising on your wrist or hand gets worse.  Your skin becomes red, gets a rash, or has open sores.  Your pain does not  get better or it gets worse. Get help right away if:  You lose feeling in your fingers or hand.  Your fingers turn white, very red, or cold and blue.  You cannot move your fingers.  You have a fever or chills. This information is not intended to replace advice given to you by your health care provider. Make sure you discuss any questions you have with your health care provider. Document Revised: 06/29/2017 Document Reviewed: 02/03/2016 Elsevier Patient Education  [place].

## 2020-05-18 NOTE — Progress Notes (Signed)
BP 127/87 (BP Location: Left Arm, Patient Position: Sitting, Cuff Size: Normal)   Pulse 90   Temp 98 F (36.7 C) (Oral)   Resp 16   Wt 231 lb (104.8 kg)   SpO2 98%   BMI 33.15 kg/m    Subjective:    Patient ID: David Wong, male    DOB: 04-25-1986, 34 y.o.   MRN: [id number]  HPI: David Wong is a 34 y.o. male  Chief Complaint  Patient presents with  . Follow-up   WRIST PAIN  Follow-up for wrist pain, left side.  He reports pain has improved.  He has history of fracture to left arm and occasionally area "pops".  No fractures on imaging and some mild soft tissue swelling was present.   Duration: improving Involved wrist: left Alleviating factors: APAP, NSAIDs, brace and rest  Status: better Treatments attempted: APAP, ibuprofen and aleve    Relief with NSAIDs?:  moderate Weakness: no Numbness: none Redness: no Bruising: no Swelling: no Fevers: no  Relevant past medical, surgical, family and social history reviewed and updated as indicated. Interim medical history since our last visit reviewed. Allergies and medications reviewed and updated.  Review of Systems  Constitutional: Negative.   Respiratory: Negative.   Cardiovascular: Negative.   Gastrointestinal: Negative.   Musculoskeletal: Positive for arthralgias. Negative for back pain, gait problem, joint swelling, myalgias, neck pain and neck stiffness.  Neurological: Negative.   Psychiatric/Behavioral: Negative.     Per HPI unless specifically indicated above     Objective:    BP 127/87 (BP Location: Left Arm, Patient Position: Sitting, Cuff Size: Normal)   Pulse 90   Temp 98 F (36.7 C) (Oral)   Resp 16   Wt 231 lb (104.8 kg)   SpO2 98%   BMI 33.15 kg/m   Wt Readings from Last 3 Encounters:  05/18/20 231 lb (104.8 kg)  04/27/20 232 lb (105.2 kg)  02/03/20 226 lb 3.2 oz (102.6 kg)    Physical Exam Vitals and nursing note reviewed.  Constitutional:      General: He is not in acute  distress.    Appearance: Normal appearance. He is not ill-appearing, toxic-appearing or diaphoretic.  HENT:     Head: Normocephalic and atraumatic.     Right Ear: External ear normal.     Left Ear: External ear normal.     Nose: Nose normal.     Mouth/Throat:     Mouth: Mucous membranes are moist.     Pharynx: Oropharynx is clear.  Eyes:     General: No scleral icterus.       Right eye: No discharge.        Left eye: No discharge.     Extraocular Movements: Extraocular movements intact.     Conjunctiva/sclera: Conjunctivae normal.     Pupils: Pupils are equal, round, and reactive to light.  Cardiovascular:     Rate and Rhythm: Normal rate and regular rhythm.     Pulses: Normal pulses.     Heart sounds: Normal heart sounds. No murmur heard.  No friction rub. No gallop.   Pulmonary:     Effort: Pulmonary effort is normal. No respiratory distress.     Breath sounds: Normal breath sounds. No stridor. No wheezing, rhonchi or rales.  Chest:     Chest wall: No tenderness.  Musculoskeletal:        General: No tenderness. Normal range of motion.     Right wrist: Normal.  Left wrist: No swelling, tenderness or crepitus. Normal range of motion.     Cervical back: Normal range of motion and neck supple.  Skin:    General: Skin is warm and dry.     Capillary Refill: Capillary refill takes less than 2 seconds.     Coloration: Skin is not jaundiced or pale.     Findings: No bruising, erythema, lesion or rash.     Comments: 1 cm area of crusting from previous skin biopsy to left upper arm  Neurological:     General: No focal deficit present.     Mental Status: He is alert and oriented to person, place, and time. Mental status is at baseline.  Psychiatric:        Mood and Affect: Mood normal.        Behavior: Behavior normal.        Thought Content: Thought content normal.        Judgment: Judgment normal.     Results for orders placed or performed in visit on 04/27/20  Surgical  pathology  Result Value Ref Range   SURGICAL PATHOLOGY      SURGICAL PATHOLOGY CASE: [id number] PATIENT: Glendale Chard Surgical Pathology Report     Clinical History: neoplasm of uncertain behavior of skin, 2 mm dark red lesion on left shoulder (cm)     FINAL MICROSCOPIC DIAGNOSIS:  A. SKIN, LEFT SHOULDER, BIOPSY: - Benign hemangioma    GROSS DESCRIPTION:  Received in formalin is a 0.6 x 0.5 cm tan-white skin shave with a central 0.3 x 0.2 cm dark brown well-defined macule.  Bisected and submitted 1 block.  SW 04/27/2020    Final Diagnosis performed by Jaquita Folds, MD.   Electronically signed 04/28/2020 Technical component performed at [facility]. [facility], [place] [address] [place], [place] [address].  Professional component performed at [facility], [place] [address], [place], [place] [address].  Immunohistochemistry Technical component (if applicable) was performed at [facility]. [address] [place], [place], [place] [address].   IMMUNOHISTOCHEMISTRY  DISCLAIMER (if applicable): Some of these immunohistochemical stains may have been developed and the performance characteristics determine by [facility]. Some may not have been cleared or approved by the U.S. Food and Drug Administration. The FDA has determined that such clearance or approval is not necessary. This test is used for clinical purposes. It should not be regarded as investigational or for research. This laboratory is certified under the [place] (CLIA-88) as qualified to perform high complexity clinical laboratory testing.  The controls stained appropriately.       Assessment & Plan:   Problem List Items Addressed This Visit    None    Visit Diagnoses    Left wrist pain    -  Primary   Acute and improving at this time.  Recommend continue current brace and Naproxen regimen at home as  needed.  Return for worsening.       Follow up plan: Return if symptoms worsen or fail to improve.

## 2020-07-28 ENCOUNTER — Ambulatory Visit: Payer: BC Managed Care – PPO | Admitting: Nurse Practitioner

## 2020-08-19 ENCOUNTER — Other Ambulatory Visit: Payer: Self-pay | Admitting: Nurse Practitioner

## 2020-10-11 DIAGNOSIS — A084 Viral intestinal infection, unspecified: Secondary | ICD-10-CM | POA: Diagnosis not present

## 2020-11-09 ENCOUNTER — Encounter: Payer: Self-pay | Admitting: Nurse Practitioner

## 2020-11-09 ENCOUNTER — Other Ambulatory Visit: Payer: Self-pay

## 2020-11-09 ENCOUNTER — Ambulatory Visit (INDEPENDENT_AMBULATORY_CARE_PROVIDER_SITE_OTHER): Payer: BC Managed Care – PPO | Admitting: Nurse Practitioner

## 2020-11-09 VITALS — BP 114/80 | HR 76 | Temp 97.8°F | Wt 231.2 lb

## 2020-11-09 DIAGNOSIS — K640 First degree hemorrhoids: Secondary | ICD-10-CM

## 2020-11-09 MED ORDER — HYDROCORTISONE (PERIANAL) 2.5 % EX CREA: 1.0000 "application " | TOPICAL_CREAM | Freq: Two times a day (BID) | CUTANEOUS | 4 refills | Status: DC

## 2020-11-09 MED ORDER — HYDROCODONE-ACETAMINOPHEN 5-325 MG PO TABS: 1.0000 | ORAL_TABLET | Freq: Four times a day (QID) | ORAL | 0 refills | Status: DC | PRN

## 2020-11-09 NOTE — Assessment & Plan Note (Signed)
Last had procedure for removal one year ago, currently in acute pain due to new hemorrhoid presentation with inflammation to area and thrombosed.  Recommend sitz baths at home and will send in Anusol cream for comfort.  Norco short course sent in for pain, 5 day supply, to use as needed.  Will provide work note.  Urgent referral to general surgery for assessment, had success with Dr. Christian Mate last year and would like to return.  Follow-up in 2 weeks.

## 2020-11-09 NOTE — Progress Notes (Signed)
BP 114/80   Pulse 76   Temp 97.8 F (36.6 C) (Oral)   Wt 231 lb 3.2 oz (104.9 kg)   SpO2 98%   BMI 33.17 kg/m    Subjective:    Patient ID: David Wong, male    DOB: [birth year], 35 y.o.   MRN: [id number]  HPI: David Wong is a 35 y.o. male  Chief Complaint  Patient presents with  . Post-op Problem    Patient states he had hemorrhoid surgery last year and now he states it feels like a large knot on the inner left buttock. Patient denies noticing any drainage and first notice Wed/Thurs last week.    SKIN INFECTION Reports has a large knot to inner left buttock, he had to leave work today due to discomfort.  It is in area of past hemorrhoid surgery.  No drainage noted.  First noticed on Wednesday or Thursday last week. Duration: days Location: left buttock History of trauma in area: history of hemorrhoid surgery one year ago Pain: yes Quality: yes Severity: 10/10 Redness: no Swelling: no Oozing: no Pus: no Fevers: no Nausea/vomiting: no Status: worse Treatments attempted:none  Tetanus: UTD  Relevant past medical, surgical, family and social history reviewed and updated as indicated. Interim medical history since our last visit reviewed. Allergies and medications reviewed and updated.  Review of Systems  Constitutional: Negative for activity change, appetite change, diaphoresis, fatigue, fever and unexpected weight change.  Respiratory: Negative.   Cardiovascular: Negative.   Gastrointestinal: Positive for rectal pain. Negative for abdominal distention, abdominal pain, blood in stool, constipation, diarrhea, nausea and vomiting.  Psychiatric/Behavioral: Negative.     Per HPI unless specifically indicated above     Objective:    BP 114/80   Pulse 76   Temp 97.8 F (36.6 C) (Oral)   Wt 231 lb 3.2 oz (104.9 kg)   SpO2 98%   BMI 33.17 kg/m   Wt Readings from Last 3 Encounters:  11/09/20 231 lb 3.2 oz (104.9 kg)  05/18/20 231 lb (104.8 kg)  04/27/20  232 lb (105.2 kg)    Physical Exam Vitals and nursing note reviewed.  Constitutional:      General: He is awake. He is not in acute distress.    Appearance: He is well-developed and well-groomed. He is obese. He is not ill-appearing or toxic-appearing.  HENT:     Head: Normocephalic and atraumatic.     Right Ear: Hearing normal. No drainage.     Left Ear: Hearing normal. No drainage.  Eyes:     General: Lids are normal.        Right eye: No discharge.        Left eye: No discharge.     Conjunctiva/sclera: Conjunctivae normal.     Pupils: Pupils are equal, round, and reactive to light.  Neck:     Trachea: Trachea normal.  Cardiovascular:     Rate and Rhythm: Normal rate and regular rhythm.     Heart sounds: Normal heart sounds, S1 normal and S2 normal. No murmur heard. No gallop.   Pulmonary:     Effort: Pulmonary effort is normal. No accessory muscle usage or respiratory distress.     Breath sounds: Normal breath sounds.  Abdominal:     General: Bowel sounds are normal. There is no distension.     Palpations: Abdomen is soft.     Tenderness: There is no abdominal tenderness.  Genitourinary:    Rectum: Tenderness and external hemorrhoid present.  No mass or anal fissure.     Comments: Unable to sit in exam room, in significant pain.  External hemorrhoid noted with inflammation, purple in color, to 12 o'clock aspect of rectal area.  Significant tenderness during exam.  No bleeding. Musculoskeletal:        General: Normal range of motion.     Cervical back: Normal range of motion and neck supple.     Right lower leg: No edema.     Left lower leg: No edema.  Skin:    General: Skin is warm and dry.     Capillary Refill: Capillary refill takes less than 2 seconds.  Neurological:     Mental Status: He is alert and oriented to person, place, and time.  Psychiatric:        Attention and Perception: Attention normal.        Mood and Affect: Mood normal.        Speech: Speech  normal.        Behavior: Behavior normal. Behavior is cooperative.        Thought Content: Thought content normal.     Results for orders placed or performed in visit on 04/27/20  Surgical pathology  Result Value Ref Range   SURGICAL PATHOLOGY      SURGICAL PATHOLOGY CASE: [id number] PATIENT: Glendale Chard Surgical Pathology Report     Clinical History: neoplasm of uncertain behavior of skin, 2 mm dark red lesion on left shoulder (cm)     FINAL MICROSCOPIC DIAGNOSIS:  A. SKIN, LEFT SHOULDER, BIOPSY: - Benign hemangioma    GROSS DESCRIPTION:  Received in formalin is a 0.6 x 0.5 cm tan-white skin shave with a central 0.3 x 0.2 cm dark brown well-defined macule.  Bisected and submitted 1 block.  SW 04/27/2020    Final Diagnosis performed by Jaquita Folds, MD.   Electronically signed 04/28/2020 Technical component performed at [facility]. [facility], [place] [address] [place], [place] [address].  Professional component performed at [facility], [place] [address], [place], [place] [address].  Immunohistochemistry Technical component (if applicable) was performed at [facility]. [address], [place], [place], [place] [address].   IMMUNOHISTOCHEMISTRY  DISCLAIMER (if applicable): Some of these immunohistochemical stains may have been developed and the performance characteristics determine by [facility]. Some may not have been cleared or approved by the U.S. Food and Drug Administration. The FDA has determined that such clearance or approval is not necessary. This test is used for clinical purposes. It should not be regarded as investigational or for research. This laboratory is certified under the [place] (CLIA-88) as qualified to perform high complexity clinical laboratory testing.  The controls stained appropriately.       Assessment & Plan:   Problem  List Items Addressed This Visit      Cardiovascular and Mediastinum   Hemorrhoids - Primary    Last had procedure for removal one year ago, currently in acute pain due to new hemorrhoid presentation with inflammation to area and thrombosed.  Recommend sitz baths at home and will send in Anusol cream for comfort.  Norco short course sent in for pain, 5 day supply, to use as needed.  Will provide work note.  Urgent referral to general surgery for assessment, had success with Dr. Christian Mate last year and would like to return.  Follow-up in 2 weeks.      Relevant Orders   Ambulatory referral to General  Surgery       Follow up plan: Return in about 2 weeks (around 11/23/2020) for hemorrhoid.

## 2020-11-09 NOTE — Patient Instructions (Signed)
Hemorrhoids Hemorrhoids are swollen veins that may develop:  In the butt (rectum). These are called internal hemorrhoids.  Around the opening of the butt (anus). These are called external hemorrhoids. Hemorrhoids can cause pain, itching, or bleeding. Most of the time, they do not cause serious problems. They usually get better with diet changes, lifestyle changes, and other home treatments. What are the causes? This condition may be caused by:  Having trouble pooping (constipation).  Pushing hard (straining) to poop.  Watery poop (diarrhea).  Pregnancy.  Being very overweight (obese).  Sitting for long periods of time.  Heavy lifting or other activity that causes you to strain.  Anal sex.  Riding a bike for a long period of time. What are the signs or symptoms? Symptoms of this condition include:  Pain.  Itching or soreness in the butt.  Bleeding from the butt.  Leaking poop.  Swelling in the area.  One or more lumps around the opening of your butt. How is this diagnosed? A doctor can often diagnose this condition by looking at the affected area. The doctor may also:  Do an exam that involves feeling the area with a gloved hand (digital rectal exam).  Examine the area inside your butt using a small tube (anoscope).  Order blood tests. This may be done if you have lost a lot of blood.  Have you get a test that involves looking inside the colon using a flexible tube with a camera on the end (sigmoidoscopy or colonoscopy). How is this treated? This condition can usually be treated at home. Your doctor may tell you to change what you eat, make lifestyle changes, or try home treatments. If these do not help, procedures can be done to remove the hemorrhoids or make them smaller. These may involve:  Placing rubber bands at the base of the hemorrhoids to cut off their blood supply.  Injecting medicine into the hemorrhoids to shrink them.  Shining a type of light  energy onto the hemorrhoids to cause them to fall off.  Doing surgery to remove the hemorrhoids or cut off their blood supply. Follow these instructions at home: Eating and drinking  Eat foods that have a lot of fiber in them. These include whole grains, beans, nuts, fruits, and vegetables.  Ask your doctor about taking products that have added fiber (fibersupplements).  Reduce the amount of fat in your diet. You can do this by: ? Eating low-fat dairy products. ? Eating less red meat. ? Avoiding processed foods.  Drink enough fluid to keep your pee (urine) pale yellow.   Managing pain and swelling  Take a warm-water bath (sitz bath) for 20 minutes to ease pain. Do this 3-4 times a day. You may do this in a bathtub or using a portable sitz bath that fits over the toilet.  If told, put ice on the painful area. It may be helpful to use ice between your warm baths. ? Put ice in a plastic bag. ? Place a towel between your skin and the bag. ? Leave the ice on for 20 minutes, 2-3 times a day.   General instructions  Take over-the-counter and prescription medicines only as told by your doctor. ? Medicated creams and medicines may be used as told.  Exercise often. Ask your doctor how much and what kind of exercise is best for you.  Go to the bathroom when you have the urge to poop. Do not wait.  Avoid pushing too hard when you poop.  Keep your butt dry and clean. Use wet toilet paper or moist towelettes after pooping.  Do not sit on the toilet for a long time.  Keep all follow-up visits as told by your doctor. This is important. Contact a doctor if you:  Have pain and swelling that do not get better with treatment or medicine.  Have trouble pooping.  Cannot poop.  Have pain or swelling outside the area of the hemorrhoids. Get help right away if you have:  Bleeding that will not stop. Summary  Hemorrhoids are swollen veins in the butt or around the opening of the  butt.  They can cause pain, itching, or bleeding.  Eat foods that have a lot of fiber in them. These include whole grains, beans, nuts, fruits, and vegetables.  Take a warm-water bath (sitz bath) for 20 minutes to ease pain. Do this 3-4 times a day. This information is not intended to replace advice given to you by your health care provider. Make sure you discuss any questions you have with your health care provider. Document Revised: 07/25/2018 Document Reviewed: 12/06/2017 Elsevier Patient Education  [place].

## 2020-11-11 ENCOUNTER — Other Ambulatory Visit: Payer: Self-pay

## 2020-11-11 ENCOUNTER — Encounter: Payer: Self-pay | Admitting: Surgery

## 2020-11-11 ENCOUNTER — Ambulatory Visit (INDEPENDENT_AMBULATORY_CARE_PROVIDER_SITE_OTHER): Payer: BC Managed Care – PPO | Admitting: Surgery

## 2020-11-11 VITALS — BP 118/79 | HR 81 | Temp 99.0°F | Ht 69.0 in | Wt 228.2 lb

## 2020-11-11 DIAGNOSIS — K610 Anal abscess: Secondary | ICD-10-CM

## 2020-11-11 MED ORDER — METRONIDAZOLE 500 MG PO TABS: 500.0000 mg | ORAL_TABLET | Freq: Three times a day (TID) | ORAL | 0 refills | Status: AC

## 2020-11-11 NOTE — Patient Instructions (Signed)
Pick up your prescription from your pharmacy. See follow up appointment below. If you have any concerns or questions, please feel free to call our office.    How to Take a [employer] A sitz bath is a warm water bath that may be used to care for your rectum, genital area, or the area between your rectum and genitals (perineum). In a sitz bath, the water only comes up to your hips and covers your buttocks. A sitz bath may be done in a bathtub or with a portable sitz bath that fits over the toilet. Your health care provider may recommend a sitz bath to help:  Relieve pain and discomfort after delivering a baby.  Relieve pain and itching from hemorrhoids or anal fissures.  Relieve pain after certain surgeries.  Relax muscles that are sore or tight. How to take a sitz bath Take 3-4 sitz baths a day, or as many as told by your health care provider. Bathtub sitz bath To take a sitz bath in a bathtub: 1. Partially fill a bathtub with warm water. The water should be deep enough to cover your hips and buttocks when you are sitting in the tub. 2. Follow your health care provider's instructions if you are told to put medicine in the water. 3. Sit in the water. Open the tub drain a little, and leave it open during your bath. 4. Turn on the warm water again, enough to replace the water that is draining out. Keep the water running throughout your bath. This helps keep the water at the right level and temperature. 5. Soak in the water for 15-20 minutes, or as long as told by your health care provider. 6. When you are done, be careful when you stand up. You may feel dizzy. 7. After the sitz bath, pat yourself dry. Do not rub your skin to dry it.   Over-the-toilet sitz bath To take a sitz bath with an over-the-toilet basin: 1. Follow the manufacturer's instructions. 2. Fill the basin with warm water. 3. Follow your health care provider's instructions if you were told to put medicine in the water. 4. Sit  on the seat. Make sure the water covers your buttocks and perineum. 5. Soak in the water for 15-20 minutes, or as long as told by your health care provider. 6. After the sitz bath, pat yourself dry. Do not rub your skin to dry it. 7. Clean and dry the basin between uses. 8. Discard the basin if it cracks, or according to the manufacturer's instructions.   Contact a health care provider if:  Your pain or itching gets worse. Do not continue with sitz baths if your symptoms get worse.  You have new symptoms. Do not continue with sitz baths until you talk with your health care provider. Summary  A sitz bath is a warm water bath in which the water only comes up to your hips and covers your buttocks.  A sitz bath may help relieve pain and discomfort after delivering a baby. It also may help with pain and itching from hemorrhoids or anal fissures, or pain after certain surgeries. It can also help to relax muscles that are sore or tight.  Take 3-4 sitz baths a day, or as many as told by your health care provider. Soak in the water for 15-20 minutes.  Do not continue with sitz baths if your symptoms get worse. This information is not intended to replace advice given to you by your health care provider. Make  sure you discuss any questions you have with your health care provider. Document Revised: 04/01/2020 Document Reviewed: 04/01/2020 Elsevier Patient Education  2021 [employer].

## 2020-11-11 NOTE — Progress Notes (Signed)
[facility] SURGICAL CLINIC NOTE  11/11/2020  History of Present Illness: David Wong is a 35 y.o. male who has a history of a 3 column hemorrhoidectomy about a year ago.  He presents today that he is having primarily perianal pain to such a degree that on April 5 he could not walk at work.  He denies any blood per rectum, denies nausea, vomiting, fevers and chills.  He reports his bowel habits have been normal with relatively easy to move bowel movements 3 times daily.  He does take a fiber supplement.  Currently he reports his pain is a 10 while he is walking and currently while he is resting a 6.  He thinks he perceives a "knot" noted back there.  Past Medical History: Past Medical History:  Diagnosis Date  . Anemia   . Anxiety   . Depression   . GERD (gastroesophageal reflux disease)    occ   . IDA (iron deficiency anemia) 02/03/2020  . Sleep apnea    does not have cpap     Past Surgical History: Past Surgical History:  Procedure Laterality Date  . COLONOSCOPY WITH PROPOFOL N/A 10/08/2018   Procedure: COLONOSCOPY WITH PROPOFOL;  Surgeon: Lin Landsman, MD;  Location: [facility] ENDOSCOPY;  Service: Gastroenterology;  Laterality: N/A;  . ESOPHAGOGASTRODUODENOSCOPY (EGD) WITH PROPOFOL N/A 10/08/2018   Procedure: ESOPHAGOGASTRODUODENOSCOPY (EGD) WITH PROPOFOL;  Surgeon: Lin Landsman, MD;  Location: [facility] ENDOSCOPY;  Service: Gastroenterology;  Laterality: N/A;  . HEMORRHOID SURGERY N/A 11/17/2019   Procedure: [place] Internal & External;  Surgeon: Ronny Bacon, MD;  Location: [facility] ORS;  Service: General;  Laterality: N/A;  . right pinky finger  2013   pins inserted    Home Medications: Prior to Admission medications   Medication Sig Start Date End Date Taking? Authorizing Provider  citalopram (CELEXA) 20 MG tablet TAKE 1 TABLET BY MOUTH EVERY DAY 08/19/20  Yes Cannady, Jolene T, NP  HYDROcodone-acetaminophen (NORCO) 5-325 MG tablet Take 1  tablet by mouth every 6 (six) hours as needed for moderate pain. 11/09/20  Yes Cannady, Jolene T, NP  hydrocortisone (ANUSOL-HC) 2.5 % rectal cream Place 1 application rectally 2 (two) times daily. 11/09/20  Yes Cannady, Jolene T, NP  metroNIDAZOLE (FLAGYL) 500 MG tablet Take 1 tablet (500 mg total) by mouth 3 (three) times daily for 14 days. 11/11/20 11/25/20 Yes Ronny Bacon, MD    Allergies: No Known Allergies  Review of Systems: Review of Systems  Constitutional: Negative.   HENT: Negative.   Eyes: Negative.   Respiratory: Negative.   Cardiovascular: Negative.   Gastrointestinal: Negative for blood in stool and constipation.  Genitourinary: Negative.   Skin: Negative.   Neurological: Negative.   Psychiatric/Behavioral: Negative.     Physical Exam BP 118/79   Pulse 81   Temp 99 F (37.2 C) (Oral)   Ht 5\' 9"  (1.753 m)   Wt 228 lb 3.2 oz (103.5 kg)   SpO2 98%   BMI 33.70 kg/m   Physical Exam Constitutional:      Appearance: Normal appearance.  HENT:     Head: Normocephalic.     Nose: Nose normal.     Mouth/Throat:     Comments: Wearing a mask. Eyes:     Extraocular Movements: Extraocular movements intact.     Conjunctiva/sclera: Conjunctivae normal.  Cardiovascular:     Rate and Rhythm: Normal rate and regular rhythm.  Pulmonary:     Effort: Pulmonary effort is normal.  Abdominal:  Palpations: Abdomen is soft.  Genitourinary:    Rectum: Normal.  Musculoskeletal:        General: Normal range of motion.     Cervical back: Neck supple.  Skin:    General: Skin is warm and dry.  Neurological:     General: No focal deficit present.     Mental Status: He is alert and oriented to person, place, and time.  Psychiatric:        Mood and Affect: Mood normal.   On examination his perianal area is remarkable for 2 yellow head punctate areas with in a slightly fluctuant pink area on the left side.  There is no evidence of any external hemorrhoids.  He does have a  subcutaneous venous TelanGectasia that is unchanged from before.  Limited digital exam due to pain on the patient's left side where this fluctuant "knot" is.  Options discussed with the patient including local anesthesia with I&D.  I advised that omitting the local is less painful in the long run, and he elected to proceed with a quick stab I&D with an 11 blade.  A stab incision was made after prep with Betadine.  Immediate egress of pus with minimal blood was noted.  He does admit to some relief of pressure.  I did not explore the abscess for loculations due to the continued tenderness, but I have expectations that will continue to improve.  Labs/Imaging:   Assessment and Plan: Incision and drainage of left sided perianal abscess. As above.   Prescription for Flagyl to assist with response to drainage. He should do sitz bath's, he is well acquainted with this from his prior hemorrhoidectomy. Follow-up in 2 weeks or as needed. Face-to-face time spent with the patient and care providers was 25 minutes, with more than 50% of the time spent counseling, educating, and coordinating care of the patient.     Ronny Bacon M.D., FACS 11/11/2020, 4:00 PM

## 2020-11-22 ENCOUNTER — Other Ambulatory Visit: Payer: Self-pay | Admitting: Nurse Practitioner

## 2020-11-22 NOTE — Telephone Encounter (Signed)
Requested Prescriptions  Pending Prescriptions Disp Refills  . citalopram (CELEXA) 20 MG tablet [Pharmacy Med Name: CITALOPRAM HBR 20 MG TABLET] 90 tablet 0    Sig: TAKE 1 TABLET BY MOUTH EVERY DAY     Psychiatry:  Antidepressants - SSRI Passed - 11/22/2020  1:14 AM      Passed - Completed PHQ-2 or PHQ-9 in the last 360 days      Passed - Valid encounter within last 6 months    Recent Outpatient Visits          1 week ago Grade I hemorrhoids   [place], [place] T, NP   6 months ago Left wrist pain   [place], [place] T, NP   6 months ago Left wrist pain   [place], Megan P, DO   10 months ago Depression, major, single episode, in partial remission ([place])   [place], [place] T, NP   1 year ago Encounter for completion of form with patient   [facility] Venita Lick, NP

## 2020-11-25 ENCOUNTER — Other Ambulatory Visit: Payer: Self-pay

## 2020-11-25 ENCOUNTER — Ambulatory Visit (INDEPENDENT_AMBULATORY_CARE_PROVIDER_SITE_OTHER): Payer: BC Managed Care – PPO | Admitting: Surgery

## 2020-11-25 VITALS — BP 124/85 | HR 81 | Temp 98.2°F | Ht 70.0 in | Wt 229.4 lb

## 2020-11-25 DIAGNOSIS — K610 Anal abscess: Secondary | ICD-10-CM

## 2020-11-25 NOTE — Progress Notes (Signed)
[facility] POST-OP OFFICE VISIT  11/25/2020  HPI: David Wong is a 35 y.o. male 14 days s/p perianal abscess incision and drainage.  He denies any persistent drainage, feels the pain is significantly diminished.  He is taking the antibiotics prescribed.  Denies fevers and chills and reports regular bowel activity.  Vital signs: There were no vitals taken for this visit.   Physical Exam: Constitutional: He appears well, nontoxic. Perianal area: Without any induration or recurrence of cystic lesion/mass.  No evidence of persistent drainage, fistula or wound. Skin: Perianal skin intact without induration or erythema.  Assessment/Plan: This is a 35 y.o. male 14 days s/p perianal abscess incision and drainage.  Patient Active Problem List   Diagnosis Date Noted  . Perianal abscess 11/11/2020  . IDA (iron deficiency anemia) 02/03/2020  . Obesity 01/23/2020  . Hemorrhoids   . Depression, major, single episode, in partial remission ([place]) 06/29/2017  . Sleep apnea 06/29/2017  . Anxiety 01/08/2015  . Insomnia 01/08/2015    -Finish his course of antibiotics.  No further I&D needed at this time.  Hopefully he will not develop another recurrence/or other complication i.e. fistula.  Follow-up as needed.   Ronny Bacon M.D., FACS 11/25/2020, 3:07 PM

## 2020-11-25 NOTE — Patient Instructions (Signed)
If you have any concerns or questions, please feel free to call our office.    

## 2021-02-13 DIAGNOSIS — R079 Chest pain, unspecified: Secondary | ICD-10-CM | POA: Diagnosis not present

## 2021-02-20 DIAGNOSIS — R079 Chest pain, unspecified: Secondary | ICD-10-CM | POA: Diagnosis not present

## 2021-02-21 ENCOUNTER — Other Ambulatory Visit: Payer: Self-pay | Admitting: Nurse Practitioner

## 2021-02-21 DIAGNOSIS — S299XXA Unspecified injury of thorax, initial encounter: Secondary | ICD-10-CM | POA: Diagnosis not present

## 2021-05-04 IMAGING — CT CT ABD-PELV W/ CM
2 of 4 series · 16 of 46 positions shown, 18 images · IV contrast (Omni 300)
Comparison: October 19, 2010

CLINICAL DATA: Abdominal pain and fever, status post 5 days postop
hemorrhoidectomy.

EXAM:
CT ABDOMEN AND PELVIS WITH CONTRAST
TECHNIQUE: Multidetector CT imaging of the abdomen and pelvis was performed
using the standard protocol following bolus administration of
intravenous contrast.
CONTRAST:  100mL OMNIPAQUE IOHEXOL 300 MG/ML  SOLN

[Series 3: a/p w/ 5mm · axial · 0.88mm/px · z∈[-430,+40]mm · 13 of 104 slices shown, 15 images]
[im 5/104  soft-tissue]
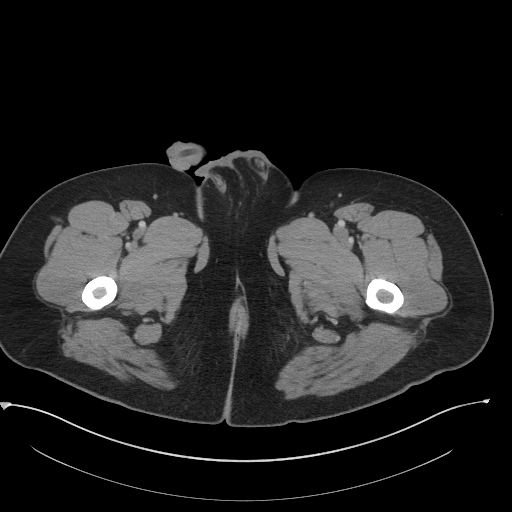
[im 5/104  bone]
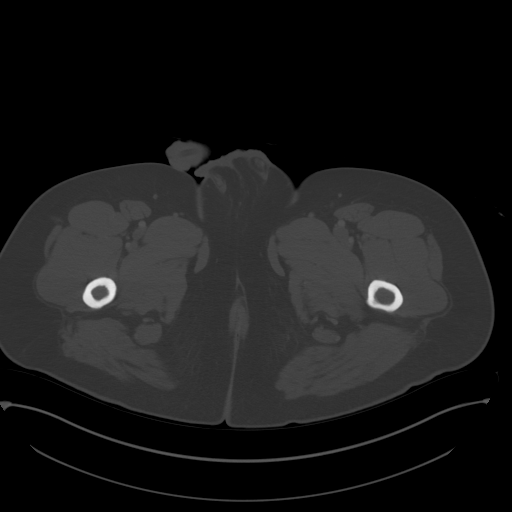
[im 13/104  soft-tissue]
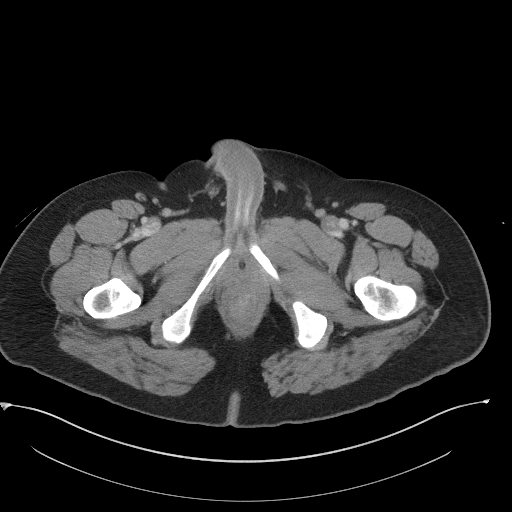
[im 22/104  soft-tissue]
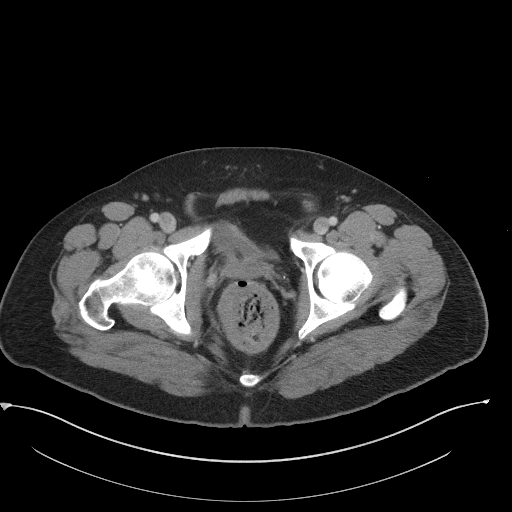
[im 31/104  soft-tissue]
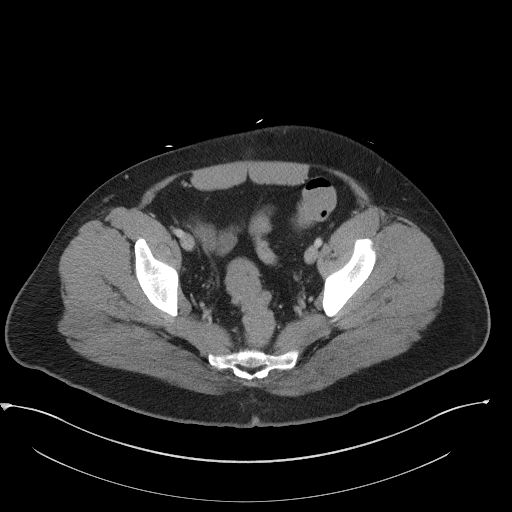
[im 35/104  soft-tissue]
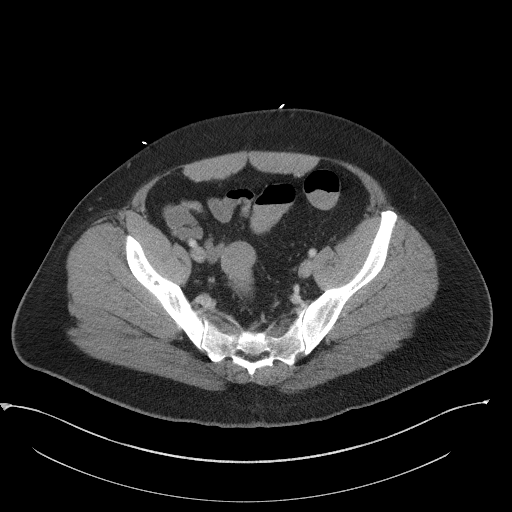
[im 43/104  soft-tissue]
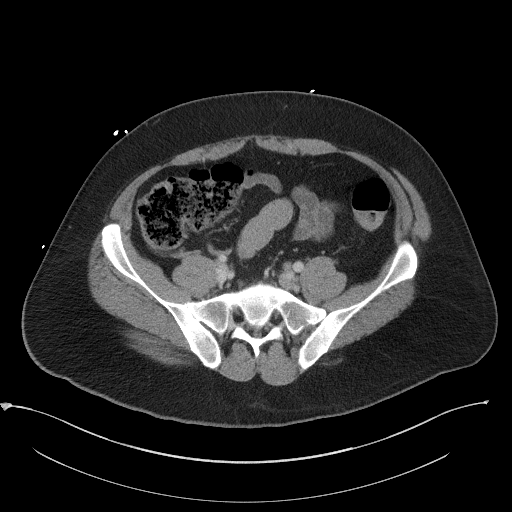
[im 52/104  soft-tissue]
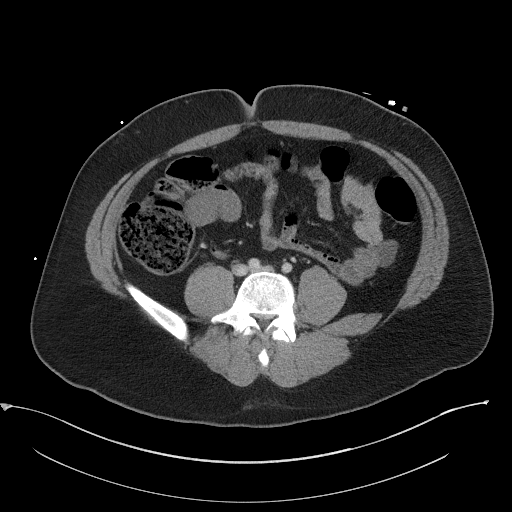
[im 61/104  soft-tissue]
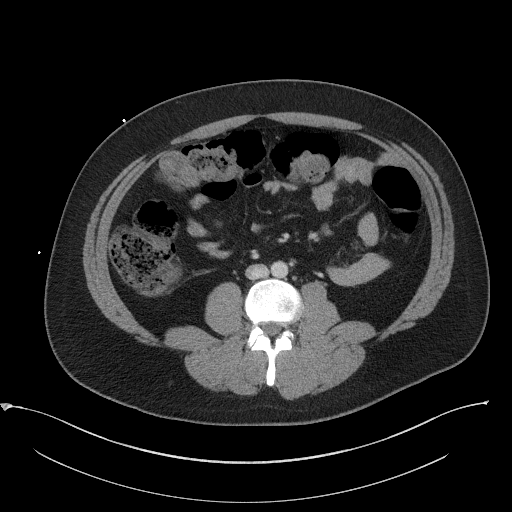
[im 69/104  soft-tissue]
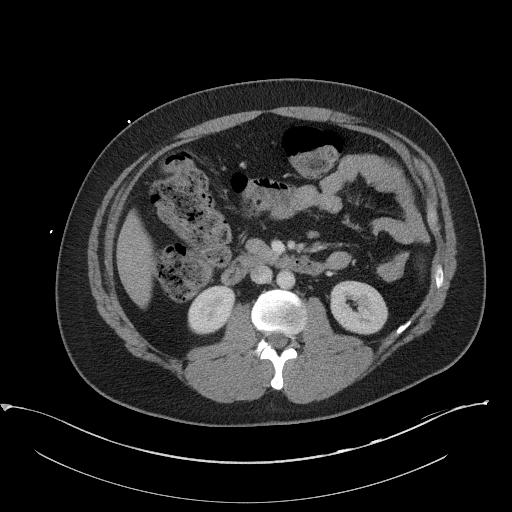
[im 69/104  bone]
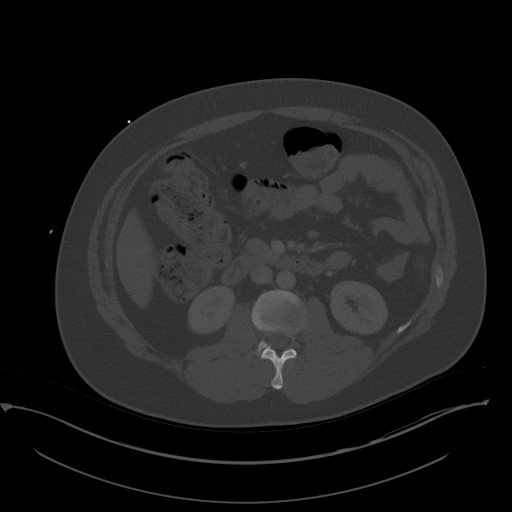
[im 73/104  soft-tissue]
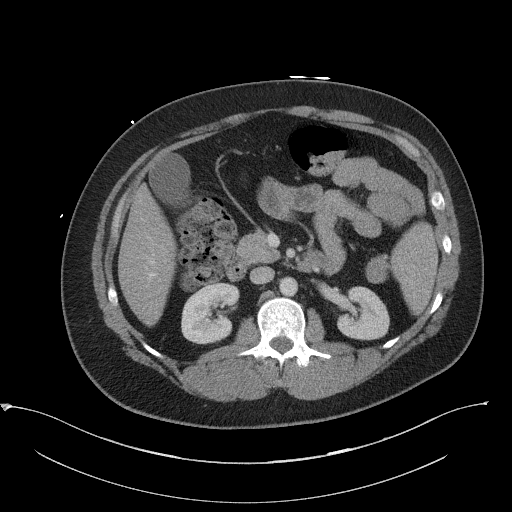
[im 82/104  soft-tissue]
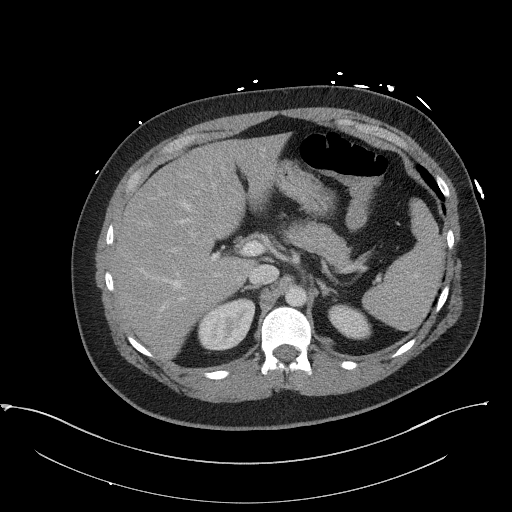
[im 91/104  soft-tissue]
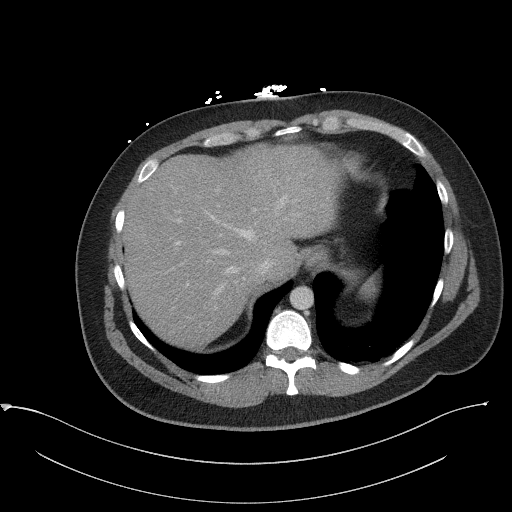
[im 99/104  soft-tissue]
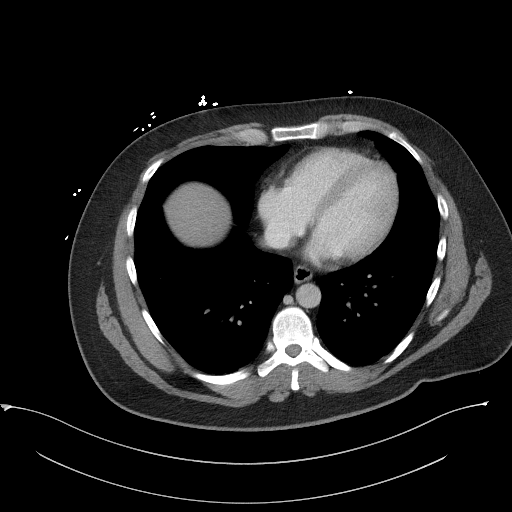

[Series 6: a/p w/ cor · coronal · 0.77mm/px · 3 of 151 slices shown]
[im 51/151  soft-tissue]
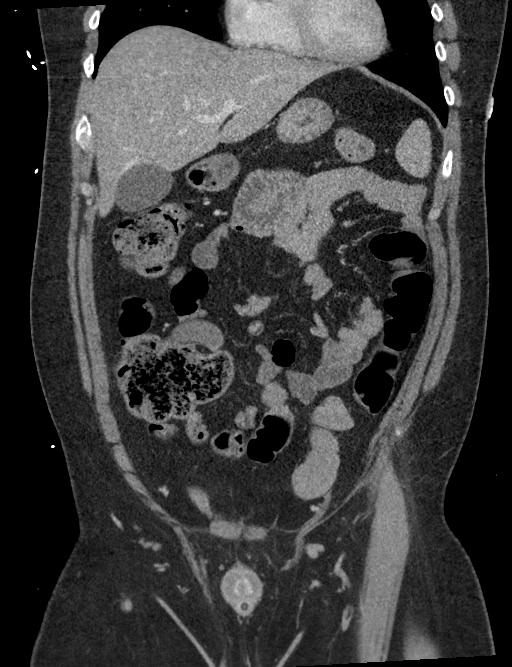
[im 67/151  soft-tissue]
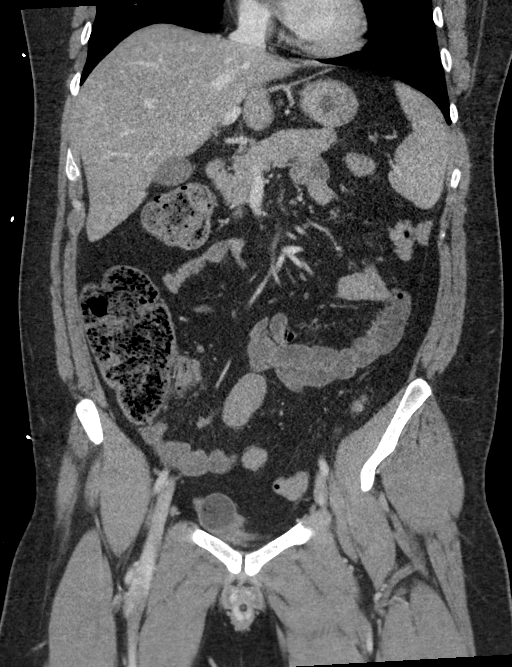
[im 84/151  soft-tissue]
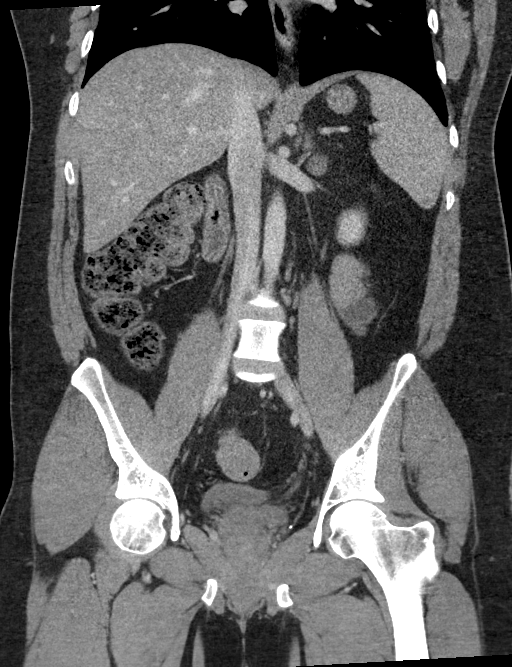

[16 of 46 positions shown; findings below may reference images not displayed]

FINDINGS: Lower chest: No acute abnormality.

Hepatobiliary: There is diffuse fatty infiltration of the liver
parenchyma. No focal liver abnormality is seen. No gallstones,
gallbladder wall thickening, or biliary dilatation.

Pancreas: Unremarkable. No pancreatic ductal dilatation or
surrounding inflammatory changes.

Spleen: Normal in size without focal abnormality.

Adrenals/Urinary Tract: Adrenal glands are unremarkable. Kidneys are
normal, without renal calculi, focal lesion, or hydronephrosis. A
Foley catheter is seen within the urinary bladder.

Stomach/Bowel: Stomach is within normal limits. Appendix appears
normal. No evidence of bowel wall thickening, distention, or
inflammatory changes.

Vascular/Lymphatic: No significant vascular findings are present. No
enlarged abdominal or pelvic lymph nodes.

Reproductive: Prostate is unremarkable.

Other: There is a 3.0 cm x 1.8 cm fat containing left inguinal
hernia.

Musculoskeletal: No acute or significant osseous findings.
IMPRESSION: 1. No acute process in the abdomen or pelvis.
2. Hepatic steatosis.
3. Fat-containing left inguinal hernia.

## 2021-05-23 ENCOUNTER — Other Ambulatory Visit: Payer: Self-pay | Admitting: Nurse Practitioner

## 2021-05-23 NOTE — Telephone Encounter (Signed)
Requested medication (s) are due for refill today:   Yes  Requested medication (s) are on the active medication list:   Yes  Future visit scheduled:   No  I called to set him up an appt but his voice mailbox is full.   Unable to leave a message to call for an appt.   Last ordered: 02/21/2021 #90, 0 refills  Returned because he is due a 6 mo. Appt however unable to contact him.   See above.    Provider to review for refills prior to an appt.   Requested Prescriptions  Pending Prescriptions Disp Refills   citalopram (CELEXA) 20 MG tablet [Pharmacy Med Name: CITALOPRAM HBR 20 MG TABLET] 90 tablet 0    Sig: TAKE 1 TABLET BY MOUTH EVERY DAY     Psychiatry:  Antidepressants - SSRI Failed - 05/23/2021  1:21 AM      Failed - Completed PHQ-2 or PHQ-9 in the last 360 days      Failed - Valid encounter within last 6 months    Recent Outpatient Visits           6 months ago Grade I hemorrhoids   [place], Barbaraann Faster, NP   1 year ago Left wrist pain   [place], [place] T, NP   1 year ago Left wrist pain   [place], DO   1 year ago Depression, major, single episode, in partial remission ([name])   [place], Henrine Screws T, NP   1 year ago Encounter for completion of form with patient   [facility] [place], Barbaraann Faster, NP

## 2021-05-24 NOTE — Telephone Encounter (Signed)
Please see below.

## 2021-05-30 NOTE — Progress Notes (Signed)
Acute Office Visit  Subjective:    Patient ID: David Wong, male    DOB: August 27, 1985, 35 y.o.   MRN: [id number]  Chief Complaint  Patient presents with   Cyst    Cyst on gluteal cleft. Drains green/brown. Has had it for about 9 months    HPI Patient is in today for follow-up on a perianal cyst that had started about 9 months ago. He saw a surgeon who performed and I&D and placed him on antibiotics. He stated that since then, it has gotten smaller, then will increase in size, drain, get smaller and keeps following this cycle. The cyst has never been fully healed over the last 9 months. He states the the color varies, but it is usually a green/yellow/brown drainage color. He endorses intermittent diarrhea. He denies fevers and redness to the site.    Past Medical History:  Diagnosis Date   Anemia    Anxiety    Depression    GERD (gastroesophageal reflux disease)    occ    IDA (iron deficiency anemia) 02/03/2020   Sleep apnea    does not have cpap    Past Surgical History:  Procedure Laterality Date   COLONOSCOPY WITH PROPOFOL N/A 10/08/2018   Procedure: COLONOSCOPY WITH PROPOFOL;  Surgeon: Lin Landsman, MD;  Location: [facility] ENDOSCOPY;  Service: Gastroenterology;  Laterality: N/A;   ESOPHAGOGASTRODUODENOSCOPY (EGD) WITH PROPOFOL N/A 10/08/2018   Procedure: ESOPHAGOGASTRODUODENOSCOPY (EGD) WITH PROPOFOL;  Surgeon: Lin Landsman, MD;  Location: [facility] ENDOSCOPY;  Service: Gastroenterology;  Laterality: N/A;   HEMORRHOID SURGERY N/A 11/17/2019   Procedure: [place] Internal & External;  Surgeon: Ronny Bacon, MD;  Location: [facility] ORS;  Service: General;  Laterality: N/A;   right pinky finger  2013   pins inserted    Family History  Problem Relation Age of Onset   Hyperlipidemia Mother    Hypertension Mother    Hyperlipidemia Father    Diabetes Father    Hypertension Brother    Diabetes Brother    Arthritis Maternal Grandfather        RA   Heart disease  Paternal Grandfather    Breast cancer Maternal Aunt     Social History   Socioeconomic History   Marital status: Single    Spouse name: Not on file   Number of children: Not on file   Years of education: Not on file   Highest education level: Not on file  Occupational History   Not on file  Tobacco Use   Smoking status: Never   Smokeless tobacco: Never  Vaping Use   Vaping Use: Every day   Start date: 02/03/2016  Substance and Sexual Activity   Alcohol use: Yes    Alcohol/week: 0.0 standard drinks    Comment: Rarely   Drug use: No   Sexual activity: Yes    Partners: Female    Comment: Fiance on birth control  Other Topics Concern   Not on file  Social History Narrative   Not on file   Social Determinants of Health   Financial Resource Strain: Not on file  Food Insecurity: Not on file  Transportation Needs: Not on file  Physical Activity: Not on file  Stress: Not on file  Social Connections: Not on file  Intimate Partner Violence: Not on file    Outpatient Medications Prior to Visit  Medication Sig Dispense Refill   citalopram (CELEXA) 20 MG tablet Take 1 tablet (20 mg total) by mouth daily. Need office visit  for further refills 60 tablet 0   HYDROcodone-acetaminophen (NORCO) 5-325 MG tablet Take 1 tablet by mouth every 6 (six) hours as needed for moderate pain. 20 tablet 0   hydrocortisone (ANUSOL-HC) 2.5 % rectal cream Place 1 application rectally 2 (two) times daily. (Patient not taking: Reported on 05/31/2021) 30 g 4   No facility-administered medications prior to visit.    No Known Allergies  Review of Systems  Constitutional: Negative.   Respiratory: Negative.    Cardiovascular: Negative.   Gastrointestinal:  Positive for diarrhea (intermittent). Negative for constipation.  Genitourinary: Negative.   Skin:  Positive for wound.  Neurological: Negative.       Objective:    Physical Exam Vitals and nursing note reviewed.  Constitutional:       Appearance: Normal appearance.  HENT:     Head: Normocephalic.  Eyes:     Conjunctiva/sclera: Conjunctivae normal.  Cardiovascular:     Rate and Rhythm: Normal rate.     Pulses: Normal pulses.  Pulmonary:     Effort: Pulmonary effort is normal.  Musculoskeletal:     Cervical back: Normal range of motion.  Skin:    General: Skin is warm.     Comments: Small, flat area of redness to left gluteal fold near rectum. Slight greenish-tan drainage noted at site. No pocket of fluid collection noted  Neurological:     General: No focal deficit present.     Mental Status: He is alert and oriented to person, place, and time.  Psychiatric:        Mood and Affect: Mood normal.        Behavior: Behavior normal.        Thought Content: Thought content normal.        Judgment: Judgment normal.    BP 124/83   Pulse 71   Temp 98.6 F (37 C) (Oral)   Ht 5\' 10"  (1.778 m)   Wt 229 lb (103.9 kg)   SpO2 98%   BMI 32.86 kg/m  Wt Readings from Last 3 Encounters:  05/31/21 229 lb (103.9 kg)  11/25/20 229 lb 6.4 oz (104.1 kg)  11/11/20 228 lb 3.2 oz (103.5 kg)    Health Maintenance Due  Topic Date Due   COVID-19 Vaccine (1) Never done   Pneumococcal Vaccine 38-69 Years old (1 - PCV) Never done    There are no preventive care reminders to display for this patient.   Lab Results  Component Value Date   TSH 1.700 07/12/2015   Lab Results  Component Value Date   WBC 6.6 01/23/2020   HGB 10.1 (L) 01/23/2020   HCT 34.7 (L) 01/23/2020   MCV 71 (L) 01/23/2020   PLT 269 01/23/2020   Lab Results  Component Value Date   NA 140 01/23/2020   K 4.3 01/23/2020   CO2 19 (L) 01/23/2020   GLUCOSE 124 (H) 01/23/2020   BUN 12 01/23/2020   CREATININE 1.09 01/23/2020   BILITOT 0.4 11/24/2019   ALKPHOS 64 11/24/2019   AST 24 11/24/2019   ALT 32 11/24/2019   PROT 7.2 11/24/2019   ALBUMIN 3.7 11/24/2019   CALCIUM 9.6 01/23/2020   ANIONGAP 5 11/24/2019   Lab Results  Component Value Date    CHOL 258 (H) 07/12/2015   Lab Results  Component Value Date   HDL 46 07/12/2015   Lab Results  Component Value Date   LDLCALC 188 (H) 07/12/2015   Lab Results  Component Value Date   TRIG 118  07/12/2015   No results found for: [facility] Lab Results  Component Value Date   HGBA1C 5.4 05/31/2021       Assessment & Plan:   Problem List Items Addressed This Visit   None Visit Diagnoses     Abscess    -  Primary   Abscess vs cyst. Referral placed back to surgeon for possible removal. Treat with doxycyline x10 days and warm compresses    Relevant Orders   Ambulatory referral to General Surgery   Elevated fasting blood sugar       Will check CMP, CBC, TSH, and A1C today with elevate sugars in the past, family history of diabetes, and recurrent abscess   Relevant Orders   CBC with Differential/Platelet   Comprehensive metabolic panel   TSH   Bayer DCA Hb A1c Waived (Completed)        Meds ordered this encounter  Medications   doxycycline (VIBRA-TABS) 100 MG tablet    Sig: Take 1 tablet (100 mg total) by mouth 2 (two) times daily.    Dispense:  20 tablet    Refill:  0      Charyl Dancer, NP

## 2021-05-31 ENCOUNTER — Other Ambulatory Visit: Payer: Self-pay

## 2021-05-31 ENCOUNTER — Encounter: Payer: Self-pay | Admitting: Nurse Practitioner

## 2021-05-31 ENCOUNTER — Ambulatory Visit (INDEPENDENT_AMBULATORY_CARE_PROVIDER_SITE_OTHER): Payer: BC Managed Care – PPO | Admitting: Nurse Practitioner

## 2021-05-31 VITALS — BP 124/83 | HR 71 | Temp 98.6°F | Ht 70.0 in | Wt 229.0 lb

## 2021-05-31 DIAGNOSIS — L0291 Cutaneous abscess, unspecified: Secondary | ICD-10-CM | POA: Diagnosis not present

## 2021-05-31 DIAGNOSIS — R7301 Impaired fasting glucose: Secondary | ICD-10-CM | POA: Diagnosis not present

## 2021-05-31 LAB — BAYER DCA HB A1C WAIVED: HB A1C (BAYER DCA - WAIVED): 5.4 % (ref 4.8–5.6)

## 2021-05-31 MED ORDER — DOXYCYCLINE HYCLATE 100 MG PO TABS: 100.0000 mg | ORAL_TABLET | Freq: Two times a day (BID) | ORAL | 0 refills | Status: DC

## 2021-05-31 NOTE — Patient Instructions (Signed)
[place] [facility] Dr. Luther Bradley [phone]

## 2021-06-01 LAB — COMPREHENSIVE METABOLIC PANEL
ALT: 125 IU/L — ABNORMAL HIGH (ref 0–44)
AST: 53 IU/L — ABNORMAL HIGH (ref 0–40)
Albumin/Globulin Ratio: 1.8 (ref 1.2–2.2)
Albumin: 4.8 g/dL (ref 4.0–5.0)
Alkaline Phosphatase: 104 IU/L (ref 44–121)
BUN/Creatinine Ratio: 14 (ref 9–20)
BUN: 14 mg/dL (ref 6–20)
Bilirubin Total: 0.3 mg/dL (ref 0.0–1.2)
CO2: 19 mmol/L — ABNORMAL LOW (ref 20–29)
Calcium: 10 mg/dL (ref 8.7–10.2)
Chloride: 106 mmol/L (ref 96–106)
Creatinine, Ser: 0.97 mg/dL (ref 0.76–1.27)
Globulin, Total: 2.7 g/dL (ref 1.5–4.5)
Glucose: 76 mg/dL (ref 70–99)
Potassium: 4.4 mmol/L (ref 3.5–5.2)
Sodium: 138 mmol/L (ref 134–144)
Total Protein: 7.5 g/dL (ref 6.0–8.5)
eGFR: 105 mL/min/{1.73_m2} (ref >59)

## 2021-06-01 LAB — CBC WITH DIFFERENTIAL/PLATELET
Basophils Absolute: 0.1 10*3/uL (ref 0.0–0.2)
Basos: 1 %
EOS (ABSOLUTE): 0.4 10*3/uL (ref 0.0–0.4)
Eos: 5 %
Hematocrit: 46.2 % (ref 37.5–51.0)
Hemoglobin: 16.1 g/dL (ref 13.0–17.7)
Immature Grans (Abs): 0 10*3/uL (ref 0.0–0.1)
Immature Granulocytes: 0 %
Lymphocytes Absolute: 2.5 10*3/uL (ref 0.7–3.1)
Lymphs: 33 %
MCH: 32.5 pg (ref 26.6–33.0)
MCHC: 34.8 g/dL (ref 31.5–35.7)
MCV: 93 fL (ref 79–97)
Monocytes Absolute: 0.8 10*3/uL (ref 0.1–0.9)
Monocytes: 10 %
Neutrophils Absolute: 4 10*3/uL (ref 1.4–7.0)
Neutrophils: 51 %
Platelets: 196 10*3/uL (ref 150–450)
RBC: 4.95 x10E6/uL (ref 4.14–5.80)
RDW: 12.7 % (ref 11.6–15.4)
WBC: 7.8 10*3/uL (ref 3.4–10.8)

## 2021-06-01 LAB — TSH: TSH: 2.68 u[IU]/mL (ref 0.450–4.500)

## 2021-06-18 ENCOUNTER — Other Ambulatory Visit: Payer: Self-pay | Admitting: Nurse Practitioner

## 2021-06-18 NOTE — Telephone Encounter (Signed)
Last RF 05/24/21 #60 Was prescribed enough refill to last until next appt- 90 day refill not appropriate

## 2021-07-06 ENCOUNTER — Encounter: Payer: Self-pay | Admitting: Oncology

## 2021-07-13 ENCOUNTER — Encounter: Payer: Self-pay | Admitting: Nurse Practitioner

## 2021-07-13 DIAGNOSIS — F324 Major depressive disorder, single episode, in partial remission: Secondary | ICD-10-CM

## 2021-07-13 DIAGNOSIS — Z0000 Encounter for general adult medical examination without abnormal findings: Secondary | ICD-10-CM

## 2021-07-13 DIAGNOSIS — F419 Anxiety disorder, unspecified: Secondary | ICD-10-CM

## 2021-07-13 DIAGNOSIS — D500 Iron deficiency anemia secondary to blood loss (chronic): Secondary | ICD-10-CM

## 2021-07-13 DIAGNOSIS — Z13220 Encounter for screening for lipoid disorders: Secondary | ICD-10-CM

## 2021-07-18 ENCOUNTER — Other Ambulatory Visit: Payer: Self-pay | Admitting: Nurse Practitioner

## 2021-07-19 NOTE — Telephone Encounter (Signed)
Requested medication (s) are due for refill today: yes  Requested medication (s) are on the active medication list: yes  Last refill:  06/26/21  Future visit scheduled: no, NO SHOW 07/13/21  Notes to clinic:  Has already had a curtesy refill, NO SHOW 07/13/21, and there is no upcoming appointment scheduled.    Requested Prescriptions  Pending Prescriptions Disp Refills   citalopram (CELEXA) 20 MG tablet [Pharmacy Med Name: CITALOPRAM HBR 20 MG TABLET] 90 tablet 1    Sig: Take 1 tablet (20 mg total) by mouth daily. Need office visit for further refills     Psychiatry:  Antidepressants - SSRI Passed - 07/18/2021 12:32 PM      Passed - Completed PHQ-2 or PHQ-9 in the last 360 days      Passed - Valid encounter within last 6 months    Recent Outpatient Visits           1 month ago Abscess   [place], Lauren A, NP   8 months ago Grade I hemorrhoids   [place], [place] T, NP   1 year ago Left wrist pain   [place] Blum, [place] T, NP   1 year ago Left wrist pain   [place], DO   1 year ago Depression, major, single episode, in partial remission [facility])   [place], Barbaraann Faster, NP

## 2021-07-22 ENCOUNTER — Other Ambulatory Visit: Payer: Self-pay | Admitting: Nurse Practitioner

## 2021-07-22 NOTE — Telephone Encounter (Signed)
Requested medication (s) are due for refill today: yes  Requested medication (s) are on the active medication list: yes  Last refill:  06/26/21  Future visit scheduled: no, NO SHOW 07/13/21  Notes to clinic:  Has already had a curtesy refill and there is no upcoming appointment scheduled.   Requested Prescriptions  Pending Prescriptions Disp Refills   citalopram (CELEXA) 20 MG tablet [Pharmacy Med Name: CITALOPRAM HBR 20 MG TABLET] 30 tablet 1    Sig: Take 1 tablet (20 mg total) by mouth daily. Need office visit for further refills     Psychiatry:  Antidepressants - SSRI Passed - 07/22/2021  1:24 AM      Passed - Completed PHQ-2 or PHQ-9 in the last 360 days      Passed - Valid encounter within last 6 months    Recent Outpatient Visits           1 month ago Abscess   [place], Lauren A, NP   8 months ago Grade I hemorrhoids   [place], [place] T, NP   1 year ago Left wrist pain   [place], [place] T, NP   1 year ago Left wrist pain   [place], DO   1 year ago Depression, major, single episode, in partial remission [facility])   [place], Barbaraann Faster, NP

## 2021-07-27 ENCOUNTER — Other Ambulatory Visit: Payer: Self-pay | Admitting: Nurse Practitioner

## 2021-07-27 NOTE — Telephone Encounter (Signed)
Copied from [place] [id number]. Topic: Quick Communication - Rx Refill/Question >> Jul 27, 2021  2:21 PM Lenon Curt, [place]rette A wrote: Medication: citalopram (CELEXA) 20 MG tablet [[id number]]   Has the patient contacted their pharmacy? Yes.  The patient has been directed to contact their PCP (Agent: If no, request that the patient contact the pharmacy for the refill. If patient does not wish to contact the pharmacy document the reason why and proceed with request.) (Agent: If yes, when and what did the pharmacy advise?)  Preferred Pharmacy (with phone number or street name): [id number] - [place], [place]  Phone:  [phone] Fax:  [phone]    Has the patient been seen for an appointment in the last year OR does the patient have an upcoming appointment? Yes.    Agent: Please be advised that RX refills may take up to 3 business days. We ask that you follow-up with your pharmacy.

## 2021-07-27 NOTE — Telephone Encounter (Signed)
Requested medication (s) are due for refill today Yes-This medication last ordered on 05/24/21 #60 tabs  Requested medication (s) are on the active medication list Yes  Future visit scheduled No. TC to the patient. Unable to leave VM  Note to clinic-Attempted to reach patient to schedule OV-no answer and unable to leave VM. Routing to provider for review.   Requested Prescriptions  Pending Prescriptions Disp Refills   citalopram (CELEXA) 20 MG tablet 60 tablet 0    Sig: Take 1 tablet (20 mg total) by mouth daily. Need office visit for further refills     Psychiatry:  Antidepressants - SSRI Passed - 07/27/2021  3:52 PM      Passed - Completed PHQ-2 or PHQ-9 in the last 360 days      Passed - Valid encounter within last 6 months    Recent Outpatient Visits           1 month ago Abscess   [place], Lauren A, NP   8 months ago Grade I hemorrhoids   [place], [place] T, NP   1 year ago Left wrist pain   [place], [place] T, NP   1 year ago Left wrist pain   [place], DO   1 year ago Depression, major, single episode, in partial remission [facility])   [place], Barbaraann Faster, NP

## 2021-08-02 ENCOUNTER — Encounter: Payer: Self-pay | Admitting: Nurse Practitioner

## 2021-08-02 ENCOUNTER — Ambulatory Visit (INDEPENDENT_AMBULATORY_CARE_PROVIDER_SITE_OTHER): Payer: Self-pay | Admitting: Nurse Practitioner

## 2021-08-02 ENCOUNTER — Other Ambulatory Visit: Payer: Self-pay

## 2021-08-02 VITALS — BP 114/70 | HR 71 | Temp 98.9°F | Ht 70.0 in | Wt 227.4 lb

## 2021-08-02 DIAGNOSIS — E6609 Other obesity due to excess calories: Secondary | ICD-10-CM

## 2021-08-02 DIAGNOSIS — F419 Anxiety disorder, unspecified: Secondary | ICD-10-CM

## 2021-08-02 DIAGNOSIS — F324 Major depressive disorder, single episode, in partial remission: Secondary | ICD-10-CM

## 2021-08-02 DIAGNOSIS — Z6832 Body mass index (BMI) 32.0-32.9, adult: Secondary | ICD-10-CM

## 2021-08-02 MED ORDER — CITALOPRAM HYDROBROMIDE 20 MG PO TABS: 20.0000 mg | ORAL_TABLET | Freq: Every day | ORAL | 4 refills | Status: DC

## 2021-08-02 NOTE — Assessment & Plan Note (Signed)
Chronic, stable. Denies SI/HI.  Continue current medication regimen and adjust as needed.  Refills sent in on Celexa.  Return in 6 months.

## 2021-08-02 NOTE — Assessment & Plan Note (Signed)
BMI 32.63.  Recommended eating smaller high protein, low fat meals more frequently and exercising 30 mins a day 5 times a week with a goal of 10-15lb weight loss in the next 3 months. Patient voiced their understanding and motivation to adhere to these recommendations.

## 2021-08-02 NOTE — Assessment & Plan Note (Signed)
Refer to depression POC. 

## 2021-08-02 NOTE — Patient Instructions (Signed)

## 2021-08-02 NOTE — Progress Notes (Signed)
BP 114/70    Pulse 71    Temp 98.9 F (37.2 C) (Oral)    Ht _0  (1.778 m)    Wt 227 lb 6.4 oz (103.1 kg)    SpO2 98%    BMI 32.63 kg/m    Subjective:    Patient ID: David Wong, male    DOB: July 24, 1986, 36 y.o.   MRN: [id number]  HPI: David Wong is a 36 y.o. male  Chief Complaint  Patient presents with   Anxiety   ANXIETY/STRESS/DEPRESSION Continues on Celexa 20 MG daily. Duration:stable Anxious mood:  occasional   Excessive worrying: no Irritability: no  Sweating: no Nausea: no Palpitations:no Hyperventilation: no Panic attacks: no Agoraphobia: no  Obscessions/compulsions: no Depressed mood:  occasional Depression screen [facility] 2/9 08/02/2021 05/31/2021 04/27/2020 01/23/2020 09/01/2019  Decreased Interest 0 0 1 0 1  Down, Depressed, Hopeless _1 0  PHQ - 2 Score _2 Altered sleeping _3 Tired, decreased energy 1 0 0 0 0  Change in appetite 0 0 0 0 0  Feeling bad or failure about yourself  0 1 0 0 1  Trouble concentrating 0 0 0 0 0  Moving slowly or fidgety/restless 0 0 0 0 0  Suicidal thoughts 0 0 0 0 0  PHQ-9 Score _4 Difficult doing work/chores Not difficult at all Not difficult at all - Not difficult at all Not difficult at all  Anhedonia: no Weight changes: no Insomnia: yes hard to stay asleep  Hypersomnia: no Fatigue/loss of energy: no Feelings of worthlessness: no Feelings of guilt: no Impaired concentration/indecisiveness: no Suicidal ideations: no  Crying spells: no Recent Stressors/Life Changes: yes   Relationship problems: no   Family stress: no     Financial stress: yes    Job stress: yes -- new job and waiting on new insurance   Recent death/loss: no  GAD 7 : Generalized Anxiety Score 08/02/2021 05/31/2021 01/23/2020 09/01/2019  Nervous, Anxious, on Edge 0 1 0 0  Control/stop worrying 1 1 0 0  Worry too much - different things 1 0 0 1  Trouble relaxing 0 1 1 0  Restless 0 0 0 0  Easily annoyed or irritable 1 1 0 1   Afraid - awful might happen 0 0 0 0  Total GAD 7 Score _5 Anxiety Difficulty Somewhat difficult Somewhat difficult Not difficult at all Not difficult at all   Relevant past medical, surgical, family and social history reviewed and updated as indicated. Interim medical history since our last visit reviewed. Allergies and medications reviewed and updated.  Review of Systems  Constitutional:  Negative for activity change, diaphoresis, fatigue and fever.  Respiratory:  Negative for cough, chest tightness, shortness of breath and wheezing.   Cardiovascular:  Negative for chest pain, palpitations and leg swelling.  Gastrointestinal:  Negative for anal bleeding.  Endocrine: Negative for cold intolerance, heat intolerance, polydipsia, polyphagia and polyuria.  Neurological: Negative.   Psychiatric/Behavioral: Negative.     Per HPI unless specifically indicated above     Objective:    BP 114/70    Pulse 71    Temp 98.9 F (37.2 C) (Oral)    Ht _6  (1.778 m)    Wt 227 lb 6.4 oz (103.1 kg)    SpO2 98%    BMI 32.63 kg/m   Wt Readings from Last 3 Encounters:  08/02/21 227 lb 6.4 oz (103.1 kg)  05/31/21 229 lb (103.9 kg)  11/25/20 229 lb 6.4 oz (104.1 kg)    Physical Exam Vitals and nursing note reviewed.  Constitutional:      General: He is awake. He is not in acute distress.    Appearance: He is well-developed, well-groomed and overweight. He is not ill-appearing.  HENT:     Head: Normocephalic and atraumatic.     Right Ear: Hearing normal. No drainage.     Left Ear: Hearing normal. No drainage.  Eyes:     General: Lids are normal.        Right eye: No discharge.        Left eye: No discharge.     Conjunctiva/sclera: Conjunctivae normal.     Pupils: Pupils are equal, round, and reactive to light.  Neck:     Thyroid: No thyromegaly.     Vascular: No carotid bruit.  Cardiovascular:     Rate and Rhythm: Normal rate and regular rhythm.     Heart sounds: Normal heart  sounds, S1 normal and S2 normal. No murmur heard.   No gallop.  Pulmonary:     Effort: Pulmonary effort is normal. No accessory muscle usage or respiratory distress.     Breath sounds: Normal breath sounds.  Abdominal:     General: Bowel sounds are normal.     Palpations: Abdomen is soft.     Tenderness: There is no abdominal tenderness.  Musculoskeletal:        General: Normal range of motion.     Cervical back: Normal range of motion and neck supple.     Right lower leg: No edema.     Left lower leg: No edema.  Skin:    General: Skin is warm and dry.  Neurological:     Mental Status: He is alert and oriented to person, place, and time.  Psychiatric:        Attention and Perception: Attention normal.        Mood and Affect: Mood normal.        Speech: Speech normal.        Behavior: Behavior normal. Behavior is cooperative.        Thought Content: Thought content normal.   Results for orders placed or performed in visit on 05/31/21  CBC with Differential/Platelet  Result Value Ref Range   WBC 7.8 3.4 - 10.8 x10E3/uL   RBC 4.95 4.14 - 5.80 x10E6/uL   Hemoglobin 16.1 13.0 - 17.7 g/dL   Hematocrit 46.2 37.5 - 51.0 %   MCV 93 79 - 97 fL   MCH 32.5 26.6 - 33.0 pg   MCHC 34.8 31.5 - 35.7 g/dL   RDW 12.7 11.6 - 15.4 %   Platelets 196 150 - 450 x10E3/uL   Neutrophils 51 Not Estab. %   Lymphs 33 Not Estab. %   Monocytes 10 Not Estab. %   Eos 5 Not Estab. %   Basos 1 Not Estab. %   Neutrophils Absolute 4.0 1.4 - 7.0 x10E3/uL   Lymphocytes Absolute 2.5 0.7 - 3.1 x10E3/uL   Monocytes Absolute 0.8 0.1 - 0.9 x10E3/uL   EOS (ABSOLUTE) 0.4 0.0 - 0.4 x10E3/uL   Basophils Absolute 0.1 0.0 - 0.2 x10E3/uL   Immature Granulocytes 0 Not Estab. %   Immature Grans (Abs) 0.0 0.0 - 0.1 x10E3/uL  Comprehensive metabolic panel  Result Value Ref Range   Glucose 76 70 - 99 mg/dL   BUN 14  6 - 20 mg/dL   Creatinine, Ser 0.97 0.76 - 1.27 mg/dL   eGFR 105 >59 mL/min/1.73   BUN/Creatinine Ratio  14 9 - 20   Sodium 138 134 - 144 mmol/L   Potassium 4.4 3.5 - 5.2 mmol/L   Chloride 106 96 - 106 mmol/L   CO2 19 (L) 20 - 29 mmol/L   Calcium 10.0 8.7 - 10.2 mg/dL   Total Protein 7.5 6.0 - 8.5 g/dL   Albumin 4.8 4.0 - 5.0 g/dL   Globulin, Total 2.7 1.5 - 4.5 g/dL   Albumin/Globulin Ratio 1.8 1.2 - 2.2   Bilirubin Total 0.3 0.0 - 1.2 mg/dL   Alkaline Phosphatase 104 44 - 121 IU/L   AST 53 (H) 0 - 40 IU/L   ALT 125 (H) 0 - 44 IU/L  TSH  Result Value Ref Range   TSH 2.680 0.450 - 4.500 uIU/mL  Bayer DCA Hb A1c Waived  Result Value Ref Range   HB A1C (BAYER DCA - WAIVED) 5.4 4.8 - 5.6 %      Assessment & Plan:   Problem List Items Addressed This Visit       Other   Anxiety    Refer to depression POC.      Relevant Medications   citalopram (CELEXA) 20 MG tablet   Depression, major, single episode, in partial remission (HCC) - Primary    Chronic, stable. Denies SI/HI.  Continue current medication regimen and adjust as needed.  Refills sent in on Celexa.  Return in 6 months.      Relevant Medications   citalopram (CELEXA) 20 MG tablet   Obesity    BMI 32.63.  Recommended eating smaller high protein, low fat meals more frequently and exercising 30 mins a day 5 times a week with a goal of 10-15lb weight loss in the next 3 months. Patient voiced their understanding and motivation to adhere to these recommendations.         Follow up plan: Return in about 6 months (around 01/30/2022) for MOOD.

## 2021-08-04 ENCOUNTER — Encounter: Payer: Self-pay | Admitting: Oncology

## 2022-01-29 NOTE — Patient Instructions (Signed)

## 2022-01-30 ENCOUNTER — Ambulatory Visit (INDEPENDENT_AMBULATORY_CARE_PROVIDER_SITE_OTHER): Payer: Self-pay | Admitting: Nurse Practitioner

## 2022-01-30 ENCOUNTER — Encounter: Payer: Self-pay | Admitting: Nurse Practitioner

## 2022-01-30 ENCOUNTER — Encounter: Payer: Self-pay | Admitting: Oncology

## 2022-01-30 VITALS — BP 103/65 | HR 53 | Temp 97.9°F | Ht 70.0 in | Wt 223.6 lb

## 2022-01-30 DIAGNOSIS — E6609 Other obesity due to excess calories: Secondary | ICD-10-CM

## 2022-01-30 DIAGNOSIS — M25512 Pain in left shoulder: Secondary | ICD-10-CM | POA: Insufficient documentation

## 2022-01-30 DIAGNOSIS — F419 Anxiety disorder, unspecified: Secondary | ICD-10-CM

## 2022-01-30 DIAGNOSIS — F324 Major depressive disorder, single episode, in partial remission: Secondary | ICD-10-CM

## 2022-01-30 DIAGNOSIS — Z6832 Body mass index (BMI) 32.0-32.9, adult: Secondary | ICD-10-CM

## 2022-01-30 MED ORDER — METHOCARBAMOL 750 MG PO TABS: 750.0000 mg | ORAL_TABLET | Freq: Three times a day (TID) | ORAL | 0 refills | Status: DC | PRN

## 2022-01-30 NOTE — Progress Notes (Signed)
BP 103/65   Pulse (!) 53   Temp 97.9 F (36.6 C) (Oral)   Ht 5' 10"  (1.778 m)   Wt 223 lb 9.6 oz (101.4 kg)   SpO2 98%   BMI 32.08 kg/m    Subjective:    Patient ID: David Wong, male    DOB: 01/10/1986, 36 y.o.   MRN: [id number]  HPI: David Wong is a 36 y.o. male  Chief Complaint  Patient presents with   Mood    Patient is here for a Mood check. Patient says everything is going good.    Shoulder Pain    Patient says he has had some Shoulder Pain in his L shoulder. Patient says he was reaching for the sunroof button in the car and noticed the pain. Patient says he is not sure if he has overextended the area but notices he cannot reach too high. Patient denies taking any medication to help with the constant discomfort.   DEPRESSION & ANXIETY Currently taking Celexa 20 MG daily. Mood status: stable Satisfied with current treatment?: yes Symptom severity: moderate  Duration of current treatment : chronic Side effects: no Medication compliance: good compliance Psychotherapy/counseling: none Depressed mood: no Anxious mood:  occasional Anhedonia: no Significant weight loss or gain: no Insomnia: none Fatigue: no Feelings of worthlessness or guilt: no Impaired concentration/indecisiveness: no Suicidal ideations: no Hopelessness: no Crying spells: no    01/30/2022    8:49 AM 08/02/2021    3:55 PM 05/31/2021   10:24 AM 04/27/2020    8:04 AM 01/23/2020    8:58 AM  Depression screen PHQ 2/9  Decreased Interest 0 0 0 1 0  Down, Depressed, Hopeless 0 1 1 1 1   PHQ - 2 Score 0 1 1 2 1   Altered sleeping 1 1 2 2 1   Tired, decreased energy 3 1 0 0 0  Change in appetite 0 0 0 0 0  Feeling bad or failure about yourself  1 0 1 0 0  Trouble concentrating 0 0 0 0 0  Moving slowly or fidgety/restless 0 0 0 0 0  Suicidal thoughts 0 0 0 0 0  PHQ-9 Score 5 3 4 4 2   Difficult doing work/chores Not difficult at all Not difficult at all Not difficult at all  Not difficult at all        01/30/2022    8:50 AM 08/02/2021    3:55 PM 05/31/2021   10:25 AM 01/23/2020    8:58 AM  GAD 7 : Generalized Anxiety Score  Nervous, Anxious, on Edge 0 0 1 0  Control/stop worrying 1 1 1  0  Worry too much - different things 2 1 0 0  Trouble relaxing 0 0 1 1  Restless 0 0 0 0  Easily annoyed or irritable 0 1 1 0  Afraid - awful might happen 0 0 0 0  Total GAD 7 Score 3 3 4 1   Anxiety Difficulty Not difficult at all Somewhat difficult Somewhat difficult Not difficult at all   SHOULDER PAIN Started two weeks ago to left shoulder.  He was sitting in car, reached to close sun roof and pain shot through collar bone. Duration: weeks Involved shoulder: left Mechanism of injury: unknown Location: anterior Onset:gradual Severity: 4/10  Quality:  dull, aching, and throbbing Frequency: constant Radiation: no Aggravating factors: lifting, movement, and throwing  Alleviating factors: nothing  Status: stable Treatments attempted: none  Relief with NSAIDs?:  No NSAIDs Taken Weakness: no Numbness: no  Decreased grip strength: no Redness: no Swelling: no Bruising: no Fevers: no   Relevant past medical, surgical, family and social history reviewed and updated as indicated. Interim medical history since our last visit reviewed. Allergies and medications reviewed and updated.  Review of Systems  Constitutional:  Negative for activity change, diaphoresis, fatigue and fever.  Respiratory:  Negative for cough, chest tightness, shortness of breath and wheezing.   Cardiovascular:  Negative for chest pain, palpitations and leg swelling.  Gastrointestinal: Negative.   Neurological: Negative.   Psychiatric/Behavioral: Negative.      Per HPI unless specifically indicated above     Objective:    BP 103/65   Pulse (!) 53   Temp 97.9 F (36.6 C) (Oral)   Ht 5' 10"  (1.778 m)   Wt 223 lb 9.6 oz (101.4 kg)   SpO2 98%   BMI 32.08 kg/m   Wt Readings from Last 3 Encounters:  01/30/22  223 lb 9.6 oz (101.4 kg)  08/02/21 227 lb 6.4 oz (103.1 kg)  05/31/21 229 lb (103.9 kg)    Physical Exam Vitals and nursing note reviewed.  Constitutional:      General: He is awake. He is not in acute distress.    Appearance: He is well-developed, well-groomed and overweight. He is not ill-appearing.  HENT:     Head: Normocephalic and atraumatic.     Right Ear: Hearing normal. No drainage.     Left Ear: Hearing normal. No drainage.  Eyes:     General: Lids are normal.        Right eye: No discharge.        Left eye: No discharge.     Conjunctiva/sclera: Conjunctivae normal.     Pupils: Pupils are equal, round, and reactive to light.  Neck:     Thyroid: No thyromegaly.     Vascular: No carotid bruit.  Cardiovascular:     Rate and Rhythm: Normal rate and regular rhythm.     Heart sounds: Normal heart sounds, S1 normal and S2 normal. No murmur heard.    No gallop.  Pulmonary:     Effort: Pulmonary effort is normal. No accessory muscle usage or respiratory distress.     Breath sounds: Normal breath sounds.  Abdominal:     General: Bowel sounds are normal.     Palpations: Abdomen is soft.     Tenderness: There is no abdominal tenderness.  Musculoskeletal:        General: Normal range of motion.     Right shoulder: Normal.     Left shoulder: Tenderness (to collar bone area) present. No swelling, bony tenderness or crepitus. Normal range of motion. Normal strength.     Cervical back: Normal range of motion and neck supple.     Right lower leg: No edema.     Left lower leg: No edema.  Skin:    General: Skin is warm and dry.  Neurological:     Mental Status: He is alert and oriented to person, place, and time.  Psychiatric:        Attention and Perception: Attention normal.        Mood and Affect: Mood normal.        Speech: Speech normal.        Behavior: Behavior normal. Behavior is cooperative.        Thought Content: Thought content normal.     Results for orders  placed or performed in visit on 05/31/21  CBC with Differential/Platelet  Result Value Ref Range   WBC 7.8 3.4 - 10.8 x10E3/uL   RBC 4.95 4.14 - 5.80 x10E6/uL   Hemoglobin 16.1 13.0 - 17.7 g/dL   Hematocrit 46.2 37.5 - 51.0 %   MCV 93 79 - 97 fL   MCH 32.5 26.6 - 33.0 pg   MCHC 34.8 31.5 - 35.7 g/dL   RDW 12.7 11.6 - 15.4 %   Platelets 196 150 - 450 x10E3/uL   Neutrophils 51 Not Estab. %   Lymphs 33 Not Estab. %   Monocytes 10 Not Estab. %   Eos 5 Not Estab. %   Basos 1 Not Estab. %   Neutrophils Absolute 4.0 1.4 - 7.0 x10E3/uL   Lymphocytes Absolute 2.5 0.7 - 3.1 x10E3/uL   Monocytes Absolute 0.8 0.1 - 0.9 x10E3/uL   EOS (ABSOLUTE) 0.4 0.0 - 0.4 x10E3/uL   Basophils Absolute 0.1 0.0 - 0.2 x10E3/uL   Immature Granulocytes 0 Not Estab. %   Immature Grans (Abs) 0.0 0.0 - 0.1 x10E3/uL  Comprehensive metabolic panel  Result Value Ref Range   Glucose 76 70 - 99 mg/dL   BUN 14 6 - 20 mg/dL   Creatinine, Ser 0.97 0.76 - 1.27 mg/dL   eGFR 105 >59 mL/min/1.73   BUN/Creatinine Ratio 14 9 - 20   Sodium 138 134 - 144 mmol/L   Potassium 4.4 3.5 - 5.2 mmol/L   Chloride 106 96 - 106 mmol/L   CO2 19 (L) 20 - 29 mmol/L   Calcium 10.0 8.7 - 10.2 mg/dL   Total Protein 7.5 6.0 - 8.5 g/dL   Albumin 4.8 4.0 - 5.0 g/dL   Globulin, Total 2.7 1.5 - 4.5 g/dL   Albumin/Globulin Ratio 1.8 1.2 - 2.2   Bilirubin Total 0.3 0.0 - 1.2 mg/dL   Alkaline Phosphatase 104 44 - 121 IU/L   AST 53 (H) 0 - 40 IU/L   ALT 125 (H) 0 - 44 IU/L  TSH  Result Value Ref Range   TSH 2.680 0.450 - 4.500 uIU/mL  Bayer DCA Hb A1c Waived  Result Value Ref Range   HB A1C (BAYER DCA - WAIVED) 5.4 4.8 - 5.6 %      Assessment & Plan:   Problem List Items Addressed This Visit       Other   Anxiety    Refer to depression POC.      Depression, major, single episode, in partial remission ([place]) - Primary    Chronic, stable. Denies SI/HI.  Continue current medication regimen and adjust as needed.  Refills sent in on  Celexa as needed.  Return in 6 months.      Left shoulder pain    Acute for 2 weeks, no worsening.  Suspect more muscle related.  Will send in Robaxin to use as needed + recommend he alternate Tylenol and Ibuprofen + heat if needed.  Ensure resting area and gentle massage.  If any worsening or ongoing then return to office.      Obesity    BMI 32.08.  Recommended eating smaller high protein, low fat meals more frequently and exercising 30 mins a day 5 times a week with a goal of 10-15lb weight loss in the next 3 months. Patient voiced their understanding and motivation to adhere to these recommendations.         Follow up plan: Return in about 6 months (around 08/02/2022) for Annual physical after 07/13/22.

## 2022-01-30 NOTE — Assessment & Plan Note (Signed)
BMI 32.08.  Recommended eating smaller high protein, low fat meals more frequently and exercising 30 mins a day 5 times a week with a goal of 10-15lb weight loss in the next 3 months. Patient voiced their understanding and motivation to adhere to these recommendations.

## 2022-01-30 NOTE — Assessment & Plan Note (Signed)
Acute for 2 weeks, no worsening.  Suspect more muscle related.  Will send in Robaxin to use as needed + recommend he alternate Tylenol and Ibuprofen + heat if needed.  Ensure resting area and gentle massage.  If any worsening or ongoing then return to office.

## 2022-01-30 NOTE — Assessment & Plan Note (Signed)
Refer to depression POC.

## 2022-01-30 NOTE — Assessment & Plan Note (Signed)
Chronic, stable. Denies SI/HI.  Continue current medication regimen and adjust as needed.  Refills sent in on Celexa as needed.  Return in 6 months.

## 2022-03-01 ENCOUNTER — Encounter: Payer: Self-pay | Admitting: Oncology

## 2022-03-09 ENCOUNTER — Encounter: Payer: Self-pay | Admitting: Surgery

## 2022-03-09 ENCOUNTER — Ambulatory Visit (INDEPENDENT_AMBULATORY_CARE_PROVIDER_SITE_OTHER): Payer: BC Managed Care – PPO | Admitting: Surgery

## 2022-03-09 ENCOUNTER — Other Ambulatory Visit: Payer: Self-pay

## 2022-03-09 ENCOUNTER — Encounter: Payer: Self-pay | Admitting: Oncology

## 2022-03-09 VITALS — BP 141/80 | HR 60 | Temp 97.8°F | Ht 70.0 in | Wt 219.0 lb

## 2022-03-09 DIAGNOSIS — K610 Anal abscess: Secondary | ICD-10-CM

## 2022-03-09 NOTE — Patient Instructions (Addendum)
MRI scheduled 03/14/2022 @ 1 pm @ [facility]. Please see your follow up appointment listed below.

## 2022-03-09 NOTE — Progress Notes (Deleted)
Surgical Clinic Progress/Follow-up Note   HPI:  36 y.o. Male presents to clinic for *** follow-up  ***Days/weeks follow the last evaluation. Patient reports *** improvement/resolution of prior issues and has been ***tolerating regular diet with ***+flatus and ***normal BM's, denies N/V, fever/chills, CP, or SOB.  Review of Systems:  Constitutional: ***denies fever/chills  ***ENT: denies sore throat, hearing problems  Respiratory: denies shortness of breath, wheezing  Cardiovascular: denies chest pain, palpitations  Gastrointestinal: ***denies abdominal pain, N/V, ***or diarrhea/and bowel function as per interval history Skin: Denies any other rashes or skin discolorations except post-surgical wounds*** as per interval history  Vital Signs:  BP (!) 141/80   Pulse 60   Temp 97.8 F (36.6 C) (Oral)   Ht '5\' 10"'$  (1.778 m)   Wt 219 lb (99.3 kg)   SpO2 99%   BMI 31.42 kg/m    Physical Exam:  Constitutional:  -- ***Normal/Obese body habitus  -- ***Awake, alert, and oriented x3  Pulmonary:  -- ***No crackles -- ***Equal breath sounds bilaterally -- ***Breathing non-labored at rest Cardiovascular:  -- S1, S2 present  -- No pericardial rubs  Gastrointestinal:  -- ***Soft and non-distended, ***non-tender/with ***mild ***peri-incisional tenderness to palpation, no guarding/rebound tenderness -- Post-surgical incisions all ***well-approximated ***without any peri-incisional erythema or drainage -- ***No abdominal masses appreciated, pulsatile or otherwise  GU  -- ***  Musculoskeletal / Integumentary:  -- Wounds or skin discoloration: ***None appreciated except post-surgical incisions*** as described above (***) -- Extremities: ***B/L UE and LE FROM, hands and feet warm, ***no edema   Laboratory studies: {Labs :18171}  Imaging: ***No new pertinent imaging available for review   Assessment:  36 y.o. yo Male with a problem list including...  Patient Active Problem List   Diagnosis  Date Noted   Left shoulder pain 01/30/2022   IDA (iron deficiency anemia) 02/03/2020   Obesity 01/23/2020   Hemorrhoids    Depression, major, single episode, in partial remission ([place]) 06/29/2017   Sleep apnea 06/29/2017   Anxiety 01/08/2015    presents to clinic for follow-up evaluation of ***, progressing well.  Plan:              - return to clinic ***as needed, instructed to call office if any questions or concerns  All of the above recommendations were discussed with the patient ***and patient's family, and all of patient's ***and family's questions were answered to ***his/her/their expressed satisfaction.  These notes generated with voice recognition software. I apologize for typographical errors.  Ronny Bacon, MD, FACS [place]: [facility]. Office: [phone]

## 2022-03-09 NOTE — Progress Notes (Signed)
Patient ID: David Wong, male   DOB: September 11, 1985, 36 y.o.   MRN: [id number]  Chief Complaint: Recurrent perianal abscess  History of Present Illness David Wong is a 36 y.o. male with a history of hemorrhoidectomy in 2021, subsequently complicated by a perianal abscess a year later, underwent I&D and has had intermittent reaccumulation of swelling and pain followed by spontaneous drainage over the last year and a half roughly.  Reports he had drainage this morning, making him concerned there would not be anything to show.  Denies fevers and chills.  No recent treatment with antibiotics.  Reports bowel activity is 2-3 bowel movements per day essentially when he eats.  Denies any constipation or diarrhea.  Past Medical History Past Medical History:  Diagnosis Date   Anemia    Anxiety    Depression    GERD (gastroesophageal reflux disease)    occ    IDA (iron deficiency anemia) 02/03/2020   Sleep apnea    does not have cpap      Past Surgical History:  Procedure Laterality Date   COLONOSCOPY WITH PROPOFOL N/A 10/08/2018   Procedure: COLONOSCOPY WITH PROPOFOL;  Surgeon: Lin Landsman, MD;  Location: [facility] ENDOSCOPY;  Service: Gastroenterology;  Laterality: N/A;   ESOPHAGOGASTRODUODENOSCOPY (EGD) WITH PROPOFOL N/A 10/08/2018   Procedure: ESOPHAGOGASTRODUODENOSCOPY (EGD) WITH PROPOFOL;  Surgeon: Lin Landsman, MD;  Location: [facility] ENDOSCOPY;  Service: Gastroenterology;  Laterality: N/A;   HEMORRHOID SURGERY N/A 11/17/2019   Procedure: HEMORRHOIDECTOMY Internal & External;  Surgeon: Ronny Bacon, MD;  Location: [facility] ORS;  Service: General;  Laterality: N/A;   right pinky finger  2013   pins inserted    No Known Allergies  Current Outpatient Medications  Medication Sig Dispense Refill   citalopram (CELEXA) 20 MG tablet Take 1 tablet (20 mg total) by mouth daily. 90 tablet 4   No current facility-administered medications for this visit.    Family History Family  History  Problem Relation Age of Onset   Hyperlipidemia Mother    Hypertension Mother    Hyperlipidemia Father    Diabetes Father    Hypertension Brother    Diabetes Brother    Arthritis Maternal Grandfather        RA   Heart disease Paternal Grandfather    Breast cancer Maternal Aunt       Social History Social History   Tobacco Use   Smoking status: Never   Smokeless tobacco: Never  Vaping Use   Vaping Use: Every day   Start date: 02/03/2016  Substance Use Topics   Alcohol use: Yes    Alcohol/week: 0.0 standard drinks of alcohol    Comment: Rarely   Drug use: No       Review of Systems  Constitutional: Negative.   HENT: Negative.    Eyes: Negative.   Respiratory: Negative.    Cardiovascular: Negative.   Gastrointestinal:  Negative for abdominal pain, blood in stool, constipation and diarrhea.  Genitourinary: Negative.   Skin: Negative.   Neurological: Negative.   Psychiatric/Behavioral: Negative.    All other systems reviewed and are negative.     Physical Exam Blood pressure (!) 141/80, pulse 60, temperature 97.8 F (36.6 C), temperature source Oral, height '5\' 10"'$  (1.778 m), weight 219 lb (99.3 kg), SpO2 99 %. Last Weight  Most recent update: 03/09/2022 11:00 AM    Weight  99.3 kg (219 lb)             CONSTITUTIONAL: Well developed, and nourished,  appropriately responsive and aware without distress.   EYES: Sclera non-icteric.   EARS, NOSE, MOUTH AND THROAT:  The oropharynx is clear. Oral mucosa is pink and moist.   Hearing is intact to voice.  NECK: Trachea is midline, and there is no jugular venous distension.  LYMPH NODES:  Lymph nodes in the neck are not enlarged. RESPIRATORY:  Lungs are clear, and breath sounds are equal bilaterally. Normal respiratory effort without pathologic use of accessory muscles. CARDIOVASCULAR: Heart is regular in rate and rhythm. GI: The abdomen is  soft, nontender, and nondistended. There were no palpable masses. I did  not appreciate hepatosplenomegaly. There were normal bowel sounds. GU: Left-sided scarring noted without appreciable os identifiable.  There is some swelling and mild induration along the left side of the anal canal, cannot palpate an internal opening.  No evidence of fluctuance, induration to suggest persisting abscess. MUSCULOSKELETAL:  Symmetrical muscle tone appreciated in all four extremities.    SKIN: Skin turgor is normal. No pathologic skin lesions appreciated.  NEUROLOGIC:  Motor and sensation appear grossly normal.  Cranial nerves are grossly without defect. PSYCH:  Alert and oriented to person, place and time. Affect is appropriate for situation.  Data Reviewed I have personally reviewed what is currently available of the patient's imaging, recent labs and medical records.   Labs:     Latest Ref Rng & Units 05/31/2021   11:01 AM 01/23/2020    9:06 AM 11/24/2019    5:12 PM  CBC  WBC 3.4 - 10.8 x10E3/uL 7.8  6.6  12.1   Hemoglobin 13.0 - 17.7 g/dL 16.1  10.1  8.9   Hematocrit 37.5 - 51.0 % 46.2  34.7  30.6   Platelets 150 - 450 x10E3/uL 196  269  236       Latest Ref Rng & Units 05/31/2021   11:01 AM 01/23/2020    9:06 AM 11/24/2019    5:12 PM  CMP  Glucose 70 - 99 mg/dL 76  124  112   BUN 6 - 20 mg/dL '14  12  17   '$ Creatinine 0.76 - 1.27 mg/dL 0.97  1.09  1.03   Sodium 134 - 144 mmol/L 138  140  139   Potassium 3.5 - 5.2 mmol/L 4.4  4.3  3.9   Chloride 96 - 106 mmol/L 106  107  112   CO2 20 - 29 mmol/L '19  19  22   '$ Calcium 8.7 - 10.2 mg/dL 10.0  9.6  8.1   Total Protein 6.0 - 8.5 g/dL 7.5   7.2   Total Bilirubin 0.0 - 1.2 mg/dL 0.3   0.4   Alkaline Phos 44 - 121 IU/L 104   64   AST 0 - 40 IU/L 53   24   ALT 0 - 44 IU/L 125   32       Imaging:  Within last 24 hrs: No results found.  Assessment    Recurrent perianal abscesses, question potential for fistula in anal, and its current course. Patient Active Problem List   Diagnosis Date Noted   Left shoulder pain  01/30/2022   IDA (iron deficiency anemia) 02/03/2020   Obesity 01/23/2020   Hemorrhoids    Depression, major, single episode, in partial remission ([place]) 06/29/2017   Sleep apnea 06/29/2017   Anxiety 01/08/2015    Plan    Will obtain anal rectal MRI to evaluate for fistulae.  Will be glad to see him more urgently should abscess recur and  intervention may be needed.  I see no role for antibiotics at this time. We discussed the potential options for rectal exam under anesthesia should MRI not defining issue.  Face-to-face time spent with the patient and accompanying care providers(if present) was 30 minutes, with more than 50% of the time spent counseling, educating, and coordinating care of the patient.    These notes generated with voice recognition software. I apologize for typographical errors.  Ronny Bacon M.D., FACS 03/09/2022, 3:36 PM

## 2022-03-14 ENCOUNTER — Ambulatory Visit
Admission: RE | Admit: 2022-03-14 | Discharge: 2022-03-14 | Disposition: A | Discharge status: Home / Self Care | Payer: BC Managed Care – PPO | Source: Ambulatory Visit | Attending: Surgery | Admitting: Surgery

## 2022-03-14 ENCOUNTER — Other Ambulatory Visit: Payer: Self-pay

## 2022-03-14 ENCOUNTER — Encounter: Payer: Self-pay | Admitting: Surgery

## 2022-03-14 DIAGNOSIS — S00259A Superficial foreign body of unspecified eyelid and periocular area, initial encounter: Secondary | ICD-10-CM

## 2022-03-14 DIAGNOSIS — K610 Anal abscess: Secondary | ICD-10-CM | POA: Insufficient documentation

## 2022-03-14 DIAGNOSIS — Z01818 Encounter for other preprocedural examination: Secondary | ICD-10-CM | POA: Diagnosis not present

## 2022-03-14 MED ORDER — GADOBUTROL 1 MMOL/ML IV SOLN
10.0000 mL | Freq: Once | INTRAVENOUS | Status: AC | PRN
  Administered 2022-03-14: 10 mL via INTRAVENOUS

## 2022-03-16 ENCOUNTER — Ambulatory Visit (INDEPENDENT_AMBULATORY_CARE_PROVIDER_SITE_OTHER): Payer: BC Managed Care – PPO | Admitting: Surgery

## 2022-03-16 ENCOUNTER — Encounter: Payer: Self-pay | Admitting: Surgery

## 2022-03-16 VITALS — BP 114/77 | HR 58 | Temp 98.3°F | Ht 70.0 in | Wt 215.0 lb

## 2022-03-16 DIAGNOSIS — K603 Anal fistula: Secondary | ICD-10-CM | POA: Diagnosis not present

## 2022-03-16 NOTE — Progress Notes (Signed)
Patient ID: David Wong, male   DOB: 04-18-1986, 36 y.o.   MRN: [id number]  Chief Complaint: Recurrent perianal abscess  History of Present Illness Presents today for follow-up of MRI results: He reports area is still little tender, continues to have some drainage.  Previously: David Wong is a 36 y.o. male with a history of hemorrhoidectomy in 2021, subsequently complicated by a perianal abscess a year later, underwent I&D and has had intermittent reaccumulation of swelling and pain followed by spontaneous drainage over the last year and a half roughly.  Reports he had drainage this morning, making him concerned there would not be anything to show.  Denies fevers and chills.  No recent treatment with antibiotics.  Reports bowel activity is 2-3 bowel movements per day essentially when he eats.  Denies any constipation or diarrhea.  Past Medical History Past Medical History:  Diagnosis Date   Anemia    Anxiety    Depression    GERD (gastroesophageal reflux disease)    occ    IDA (iron deficiency anemia) 02/03/2020   Sleep apnea    does not have cpap      Past Surgical History:  Procedure Laterality Date   COLONOSCOPY WITH PROPOFOL N/A 10/08/2018   Procedure: COLONOSCOPY WITH PROPOFOL;  Surgeon: Lin Landsman, MD;  Location: [facility] ENDOSCOPY;  Service: Gastroenterology;  Laterality: N/A;   ESOPHAGOGASTRODUODENOSCOPY (EGD) WITH PROPOFOL N/A 10/08/2018   Procedure: ESOPHAGOGASTRODUODENOSCOPY (EGD) WITH PROPOFOL;  Surgeon: Lin Landsman, MD;  Location: [facility] ENDOSCOPY;  Service: Gastroenterology;  Laterality: N/A;   HEMORRHOID SURGERY N/A 11/17/2019   Procedure: HEMORRHOIDECTOMY Internal & External;  Surgeon: Ronny Bacon, MD;  Location: [facility] ORS;  Service: General;  Laterality: N/A;   right pinky finger  2013   pins inserted    No Known Allergies  Current Outpatient Medications  Medication Sig Dispense Refill   citalopram (CELEXA) 20 MG tablet Take 1 tablet (20 mg  total) by mouth daily. 90 tablet 4   No current facility-administered medications for this visit.    Family History Family History  Problem Relation Age of Onset   Hyperlipidemia Mother    Hypertension Mother    Hyperlipidemia Father    Diabetes Father    Hypertension Brother    Diabetes Brother    Arthritis Maternal Grandfather        RA   Heart disease Paternal Grandfather    Breast cancer Maternal Aunt       Social History Social History   Tobacco Use   Smoking status: Never   Smokeless tobacco: Never  Vaping Use   Vaping Use: Every day   Start date: 02/03/2016  Substance Use Topics   Alcohol use: Yes    Alcohol/week: 0.0 standard drinks of alcohol    Comment: Rarely   Drug use: No       Review of Systems  Constitutional: Negative.   HENT: Negative.    Eyes: Negative.   Respiratory: Negative.    Cardiovascular: Negative.   Gastrointestinal:  Negative for abdominal pain, blood in stool, constipation and diarrhea.  Genitourinary: Negative.   Skin: Negative.   Neurological: Negative.   Psychiatric/Behavioral: Negative.    All other systems reviewed and are negative.     Physical Exam Blood pressure 114/77, pulse (!) 58, temperature 98.3 F (36.8 C), height '5\' 10"'$  (1.778 m), weight 215 lb (97.5 kg), SpO2 99 %. Last Weight  Most recent update: 03/16/2022  9:30 AM    Weight  97.5 kg (  215 lb)             CONSTITUTIONAL: Well developed, and nourished, appropriately responsive and aware without distress.   EYES: Sclera non-icteric.   EARS, NOSE, MOUTH AND THROAT:  The oropharynx is clear. Oral mucosa is pink and moist.   Hearing is intact to voice.  NECK: Trachea is midline, and there is no jugular venous distension.  LYMPH NODES:  Lymph nodes in the neck are not enlarged. RESPIRATORY:  Lungs are clear, and breath sounds are equal bilaterally. Normal respiratory effort without pathologic use of accessory muscles. CARDIOVASCULAR: Heart is regular in rate  and rhythm. GI: The abdomen is  soft, nontender, and nondistended. There were no palpable masses. I did not appreciate hepatosplenomegaly. There were normal bowel sounds. GU: Left-sided scarring noted without appreciable os identifiable.  There is some swelling and mild induration along the left side of the anal canal, cannot palpate an internal opening.  No evidence of fluctuance, induration to suggest persisting abscess. MUSCULOSKELETAL:  Symmetrical muscle tone appreciated in all four extremities.    SKIN: Skin turgor is normal. No pathologic skin lesions appreciated.  NEUROLOGIC:  Motor and sensation appear grossly normal.  Cranial nerves are grossly without defect. PSYCH:  Alert and oriented to person, place and time. Affect is appropriate for situation.  Data Reviewed I have personally reviewed what is currently available of the patient's imaging, recent labs and medical records.   Labs:     Latest Ref Rng & Units 05/31/2021   11:01 AM 01/23/2020    9:06 AM 11/24/2019    5:12 PM  CBC  WBC 3.4 - 10.8 x10E3/uL 7.8  6.6  12.1   Hemoglobin 13.0 - 17.7 g/dL 16.1  10.1  8.9   Hematocrit 37.5 - 51.0 % 46.2  34.7  30.6   Platelets 150 - 450 x10E3/uL 196  269  236       Latest Ref Rng & Units 05/31/2021   11:01 AM 01/23/2020    9:06 AM 11/24/2019    5:12 PM  CMP  Glucose 70 - 99 mg/dL 76  124  112   BUN 6 - 20 mg/dL '14  12  17   '$ Creatinine 0.76 - 1.27 mg/dL 0.97  1.09  1.03   Sodium 134 - 144 mmol/L 138  140  139   Potassium 3.5 - 5.2 mmol/L 4.4  4.3  3.9   Chloride 96 - 106 mmol/L 106  107  112   CO2 20 - 29 mmol/L '19  19  22   '$ Calcium 8.7 - 10.2 mg/dL 10.0  9.6  8.1   Total Protein 6.0 - 8.5 g/dL 7.5   7.2   Total Bilirubin 0.0 - 1.2 mg/dL 0.3   0.4   Alkaline Phos 44 - 121 IU/L 104   64   AST 0 - 40 IU/L 53   24   ALT 0 - 44 IU/L 125   32       Imaging:  CLINICAL DATA:  36 year old male with history of chronic perianal abscess for 2 years with abscess drainage in pain  occasionally causing difficulty with ambulation. Abscess drainage 16 months prior. History of hemorrhoidectomy.   EXAM: MRI PELVIS WITHOUT AND WITH CONTRAST   TECHNIQUE: Multiplanar multisequence MR imaging of the pelvis was performed both before and after administration of intravenous contrast.   CONTRAST:  11m GADAVIST GADOBUTROL 1 MMOL/ML IV SOLN   COMPARISON:  11/22/2019 CT abdomen/pelvis.   FINDINGS: Urinary Tract:  Normal bladder.   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 No abscess.   Vascular/Lymphatic: No pathologically enlarged pelvic lymph nodes. No acute vascular abnormality.   Reproductive:  Normal size prostate and seminal vesicles.   Other:  No pelvic ascites or fluid collections.   Musculoskeletal: No aggressive appearing focal osseous lesions.   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 No abscess.   Electronically Signed   By: Ilona Sorrel M.D.   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 Patient Active Problem List   Diagnosis Date Noted   Left shoulder pain 01/30/2022   IDA (iron deficiency anemia) 02/03/2020   Obesity 01/23/2020   Hemorrhoids    Depression, major, single episode, in partial remission ([place]) 06/29/2017   Sleep apnea 06/29/2017   Anxiety 01/08/2015    Plan    I called him back as he was not able to wait for the results of the MRI.  Reviewed the MRI findings which show intersphincteric fistula.  I am comfortable proceeding with fistulotomy which would involve a  least a partial internal sphincterotomy.  I discussed with him the degree of disability that might be anticipated postop along with what I believed would be preservation of fecal continence.  He will discuss it with his fiance, and will get back to [place] to determine proceeding with or plan or continued observation.  Face-to-face time spent with the patient and accompanying care providers(if present) was 30 minutes, with more than 50% of the time spent counseling, educating, and coordinating care of the patient.    These notes generated with voice recognition software. I apologize for typographical errors.  Ronny Bacon M.D., FACS 03/16/2022, 9:30 AM

## 2022-03-16 NOTE — H&P (View-Only) (Signed)
Patient ID: David Wong, male   DOB: [birth year], 36 y.o.   MRN: [id number]  Chief Complaint: Recurrent perianal abscess  History of Present Illness Presents today for follow-up of MRI results: He reports area is still little tender, continues to have some drainage.  Previously: David Wong is a 36 y.o. male with a history of hemorrhoidectomy in 2021, subsequently complicated by a perianal abscess a year later, underwent I&D and has had intermittent reaccumulation of swelling and pain followed by spontaneous drainage over the last year and a half roughly.  Reports he had drainage this morning, making him concerned there would not be anything to show.  Denies fevers and chills.  No recent treatment with antibiotics.  Reports bowel activity is 2-3 bowel movements per day essentially when he eats.  Denies any constipation or diarrhea.  Past Medical History Past Medical History:  Diagnosis Date   Anemia    Anxiety    Depression    GERD (gastroesophageal reflux disease)    occ    IDA (iron deficiency anemia) 02/03/2020   Sleep apnea    does not have cpap      Past Surgical History:  Procedure Laterality Date   COLONOSCOPY WITH PROPOFOL N/A 10/08/2018   Procedure: COLONOSCOPY WITH PROPOFOL;  Surgeon: Lin Landsman, MD;  Location: [facility] ENDOSCOPY;  Service: Gastroenterology;  Laterality: N/A;   ESOPHAGOGASTRODUODENOSCOPY (EGD) WITH PROPOFOL N/A 10/08/2018   Procedure: ESOPHAGOGASTRODUODENOSCOPY (EGD) WITH PROPOFOL;  Surgeon: Lin Landsman, MD;  Location: [facility] ENDOSCOPY;  Service: Gastroenterology;  Laterality: N/A;   HEMORRHOID SURGERY N/A 11/17/2019   Procedure: HEMORRHOIDECTOMY Internal & External;  Surgeon: Ronny Bacon, MD;  Location: [facility] ORS;  Service: General;  Laterality: N/A;   right pinky finger  2013   pins inserted    No Known Allergies  Current Outpatient Medications  Medication Sig Dispense Refill   citalopram (CELEXA) 20 MG tablet Take 1 tablet (20 mg  total) by mouth daily. 90 tablet 4   No current facility-administered medications for this visit.    Family History Family History  Problem Relation Age of Onset   Hyperlipidemia Mother    Hypertension Mother    Hyperlipidemia Father    Diabetes Father    Hypertension Brother    Diabetes Brother    Arthritis Maternal Grandfather        RA   Heart disease Paternal Grandfather    Breast cancer Maternal Aunt       Social History Social History   Tobacco Use   Smoking status: Never   Smokeless tobacco: Never  Vaping Use   Vaping Use: Every day   Start date: 02/03/2016  Substance Use Topics   Alcohol use: Yes    Alcohol/week: 0.0 standard drinks of alcohol    Comment: Rarely   Drug use: No       Review of Systems  Constitutional: Negative.   HENT: Negative.    Eyes: Negative.   Respiratory: Negative.    Cardiovascular: Negative.   Gastrointestinal:  Negative for abdominal pain, blood in stool, constipation and diarrhea.  Genitourinary: Negative.   Skin: Negative.   Neurological: Negative.   Psychiatric/Behavioral: Negative.    All other systems reviewed and are negative.     Physical Exam Blood pressure 114/77, pulse (!) 58, temperature 98.3 F (36.8 C), height '5\' 10"'$  (1.778 m), weight 215 lb (97.5 kg), SpO2 99 %. Last Weight  Most recent update: 03/16/2022  9:30 AM    Weight  97.5 kg (  215 lb)             CONSTITUTIONAL: Well developed, and nourished, appropriately responsive and aware without distress.   EYES: Sclera non-icteric.   EARS, NOSE, MOUTH AND THROAT:  The oropharynx is clear. Oral mucosa is pink and moist.   Hearing is intact to voice.  NECK: Trachea is midline, and there is no jugular venous distension.  LYMPH NODES:  Lymph nodes in the neck are not enlarged. RESPIRATORY:  Lungs are clear, and breath sounds are equal bilaterally. Normal respiratory effort without pathologic use of accessory muscles. CARDIOVASCULAR: Heart is regular in rate  and rhythm. GI: The abdomen is  soft, nontender, and nondistended. There were no palpable masses. I did not appreciate hepatosplenomegaly. There were normal bowel sounds. GU: Left-sided scarring noted without appreciable os identifiable.  There is some swelling and mild induration along the left side of the anal canal, cannot palpate an internal opening.  No evidence of fluctuance, induration to suggest persisting abscess. MUSCULOSKELETAL:  Symmetrical muscle tone appreciated in all four extremities.    SKIN: Skin turgor is normal. No pathologic skin lesions appreciated.  NEUROLOGIC:  Motor and sensation appear grossly normal.  Cranial nerves are grossly without defect. PSYCH:  Alert and oriented to person, place and time. Affect is appropriate for situation.  Data Reviewed I have personally reviewed what is currently available of the patient's imaging, recent labs and medical records.   Labs:     Latest Ref Rng & Units 05/31/2021   11:01 AM 01/23/2020    9:06 AM 11/24/2019    5:12 PM  CBC  WBC 3.4 - 10.8 x10E3/uL 7.8  6.6  12.1   Hemoglobin 13.0 - 17.7 g/dL 16.1  10.1  8.9   Hematocrit 37.5 - 51.0 % 46.2  34.7  30.6   Platelets 150 - 450 x10E3/uL 196  269  236       Latest Ref Rng & Units 05/31/2021   11:01 AM 01/23/2020    9:06 AM 11/24/2019    5:12 PM  CMP  Glucose 70 - 99 mg/dL 76  124  112   BUN 6 - 20 mg/dL '14  12  17   '$ Creatinine 0.76 - 1.27 mg/dL 0.97  1.09  1.03   Sodium 134 - 144 mmol/L 138  140  139   Potassium 3.5 - 5.2 mmol/L 4.4  4.3  3.9   Chloride 96 - 106 mmol/L 106  107  112   CO2 20 - 29 mmol/L '19  19  22   '$ Calcium 8.7 - 10.2 mg/dL 10.0  9.6  8.1   Total Protein 6.0 - 8.5 g/dL 7.5   7.2   Total Bilirubin 0.0 - 1.2 mg/dL 0.3   0.4   Alkaline Phos 44 - 121 IU/L 104   64   AST 0 - 40 IU/L 53   24   ALT 0 - 44 IU/L 125   32       Imaging:  CLINICAL DATA:  36 year old male with history of chronic perianal abscess for 2 years with abscess drainage in pain  occasionally causing difficulty with ambulation. Abscess drainage 16 months prior. History of hemorrhoidectomy.   EXAM: MRI PELVIS WITHOUT AND WITH CONTRAST   TECHNIQUE: Multiplanar multisequence MR imaging of the pelvis was performed both before and after administration of intravenous contrast.   CONTRAST:  15m GADAVIST GADOBUTROL 1 MMOL/ML IV SOLN   COMPARISON:  11/22/2019 CT abdomen/pelvis.   FINDINGS: Urinary Tract:  Normal bladder.   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 No abscess.   Vascular/Lymphatic: No pathologically enlarged pelvic lymph nodes. No acute vascular abnormality.   Reproductive:  Normal size prostate and seminal vesicles.   Other:  No pelvic ascites or fluid collections.   Musculoskeletal: No aggressive appearing focal osseous lesions.   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 No abscess.   Electronically Signed   By: Ilona Sorrel M.D.   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 Patient Active Problem List   Diagnosis Date Noted   Left shoulder pain 01/30/2022   IDA (iron deficiency anemia) 02/03/2020   Obesity 01/23/2020   Hemorrhoids    Depression, major, single episode, in partial remission ([place]) 06/29/2017   Sleep apnea 06/29/2017   Anxiety 01/08/2015    Plan    I called him back as he was not able to wait for the results of the MRI.  Reviewed the MRI findings which show intersphincteric fistula.  I am comfortable proceeding with fistulotomy which would involve a  least a partial internal sphincterotomy.  I discussed with him the degree of disability that might be anticipated postop along with what I believed would be preservation of fecal continence.  He will discuss it with his fiance, and will get back to [place] to determine proceeding with or plan or continued observation.  Face-to-face time spent with the patient and accompanying care providers(if present) was 30 minutes, with more than 50% of the time spent counseling, educating, and coordinating care of the patient.    These notes generated with voice recognition software. I apologize for typographical errors.  Ronny Bacon M.D., FACS 03/16/2022, 9:30 AM

## 2022-03-23 ENCOUNTER — Ambulatory Visit: Payer: Self-pay | Admitting: Surgery

## 2022-03-23 DIAGNOSIS — K603 Anal fistula: Secondary | ICD-10-CM

## 2022-03-24 ENCOUNTER — Telehealth: Payer: Self-pay | Admitting: Surgery

## 2022-03-24 NOTE — Telephone Encounter (Signed)
Patient has been advised of Pre-Admission date/time, and Surgery date.  Surgery Date: 04/14/22 @ [facility] Preadmission Testing Date: 04/05/22 (phone 8a-1p)  Patient has been made aware to call [phone], between 1-3:00pm the day before surgery, to find out what time to arrive for surgery.

## 2022-04-05 ENCOUNTER — Other Ambulatory Visit: Payer: Self-pay

## 2022-04-05 ENCOUNTER — Encounter
Admission: RE | Admit: 2022-04-05 | Discharge: 2022-04-05 | Disposition: A | Discharge status: Home / Self Care | Payer: BC Managed Care – PPO | Source: Ambulatory Visit | Attending: Surgery | Admitting: Surgery

## 2022-04-05 NOTE — Patient Instructions (Addendum)
Your procedure is scheduled on: April 14, 2022 FRIDAY Report to the Registration Desk on the 1st floor of the [facility]. To find out your arrival time, please call [phone] between 1PM - 3PM on: April 13, 2022 THURSDAY If your arrival time is 6:00 am, do not arrive prior to that time as the [place] entrance doors do not open until 6:00 am.  REMEMBER: Instructions that are not followed completely may result in serious medical risk, up to and including death; or upon the discretion of your surgeon and anesthesiologist your surgery may need to be rescheduled.  Do not EAT OR DRINK ANYTHING after midnight the night before surgery.  No gum chewing, lozengers or hard candies.   TAKE THESE MEDICATIONS THE MORNING OF SURGERY WITH A SIP OF WATER: CITALOPRAM  One week prior to surgery: Stop Anti-inflammatories (NSAIDS) such as Advil, Aleve, Ibuprofen, Motrin, Naproxen, Naprosyn and Aspirin based products such as Excedrin, Goodys Powder, BC Powder. Stop ANY OVER THE COUNTER supplements until after surgery. You may however, continue to take Tylenol if needed for pain up until the day of surgery.  No Alcohol for 24 hours before or after surgery.  No Smoking including e-cigarettes for 24 hours prior to surgery.  No chewable tobacco products for at least 6 hours prior to surgery.  No nicotine patches on the day of surgery.  Do not use any "recreational" drugs for at least a week prior to your surgery.  Please be advised that the combination of cocaine and anesthesia may have negative outcomes, up to and including death. If you test positive for cocaine, your surgery will be cancelled.  FLEET ENEMA AS INSTRUCTED- ONE NIGHT BEFORE SURGERY AND ONE MORNING OF SURGERY. (PT WILL PICK UP FROM STORE)  On the morning of surgery brush your teeth with toothpaste and water, you may rinse your mouth with mouthwash if you wish. Do not swallow any toothpaste or mouthwash.  SHOWER MORNING OF  SURGERY  Do not wear jewelry, make-up, hairpins, clips or nail polish.  Do not wear lotions, powders, or perfumes OR DEODORANT   Do not shave body from the neck down 48 hours prior to surgery just in case you cut yourself which could leave a site for infection.  Also, freshly shaved skin may become irritated if using the CHG soap.  Contact lenses, hearing aids and dentures may not be worn into surgery.  Do not bring valuables to the hospital. [facility] is not responsible for any missing/lost belongings or valuables.   Notify your doctor if there is any change in your medical condition (cold, fever, infection).  Wear comfortable clothing (specific to your surgery type) to the hospital.  After surgery, you can help prevent lung complications by doing breathing exercises.  Take deep breaths and cough every 1-2 hours. Your doctor may order a device called an Incentive Spirometer to help you take deep breaths. When coughing or sneezing, hold a pillow firmly against your incision with both hands. This is called "splinting." Doing this helps protect your incision. It also decreases belly discomfort.  If you are being discharged the day of surgery, you will not be allowed to drive home. You will need a responsible adult (18 years or older) to drive you home and stay with you that night.   If you are taking public transportation, you will need to have a responsible adult (18 years or older) with you. Please confirm with your physician that it is acceptable to use public  transportation.   Please call the [place] Dept. at [phone] if you have any questions about these instructions.  Surgery Visitation Policy:  Patients undergoing a surgery or procedure may have two family members or support persons with them as long as the person is not COVID-19 positive or experiencing its symptoms.

## 2022-04-13 MED ORDER — ACETAMINOPHEN 500 MG PO TABS: 1000.0000 mg | ORAL_TABLET | ORAL | Status: AC

## 2022-04-13 MED ORDER — CHLORHEXIDINE GLUCONATE CLOTH 2 % EX PADS: 6.0000 | MEDICATED_PAD | Freq: Once | CUTANEOUS | Status: DC

## 2022-04-13 MED ORDER — FLEET ENEMA 7-19 GM/118ML RE ENEM: 1.0000 | ENEMA | Freq: Once | RECTAL | Status: DC

## 2022-04-13 MED ORDER — FAMOTIDINE 20 MG PO TABS: 20.0000 mg | ORAL_TABLET | Freq: Once | ORAL | Status: AC

## 2022-04-13 MED ORDER — BUPIVACAINE LIPOSOME 1.3 % IJ SUSP: 20.0000 mL | Freq: Once | INTRAMUSCULAR | Status: DC

## 2022-04-13 MED ORDER — GABAPENTIN 300 MG PO CAPS: 300.0000 mg | ORAL_CAPSULE | ORAL | Status: AC

## 2022-04-13 MED ORDER — CELECOXIB 200 MG PO CAPS: 200.0000 mg | ORAL_CAPSULE | ORAL | Status: AC

## 2022-04-14 ENCOUNTER — Other Ambulatory Visit: Payer: Self-pay

## 2022-04-14 ENCOUNTER — Encounter: Payer: Self-pay | Admitting: Surgery

## 2022-04-14 ENCOUNTER — Ambulatory Visit: Payer: BC Managed Care – PPO | Admitting: Certified Registered"

## 2022-04-14 ENCOUNTER — Encounter
Admission: RE | Disposition: A | Discharge status: Home / Self Care | Payer: Self-pay | Source: Ambulatory Visit | Attending: Surgery

## 2022-04-14 ENCOUNTER — Ambulatory Visit
Admission: RE | Admit: 2022-04-14 | Discharge: 2022-04-14 | Disposition: A | Discharge status: Home / Self Care | Payer: BC Managed Care – PPO | Source: Ambulatory Visit | Attending: Surgery | Admitting: Surgery

## 2022-04-14 DIAGNOSIS — G4730 Sleep apnea, unspecified: Secondary | ICD-10-CM | POA: Insufficient documentation

## 2022-04-14 DIAGNOSIS — K219 Gastro-esophageal reflux disease without esophagitis: Secondary | ICD-10-CM | POA: Diagnosis not present

## 2022-04-14 DIAGNOSIS — K603 Anal fistula: Secondary | ICD-10-CM | POA: Insufficient documentation

## 2022-04-14 DIAGNOSIS — D649 Anemia, unspecified: Secondary | ICD-10-CM | POA: Diagnosis not present

## 2022-04-14 DIAGNOSIS — K610 Anal abscess: Secondary | ICD-10-CM | POA: Diagnosis not present

## 2022-04-14 DIAGNOSIS — F418 Other specified anxiety disorders: Secondary | ICD-10-CM | POA: Diagnosis not present

## 2022-04-14 HISTORY — PX: FISTULOTOMY: SHX6413

## 2022-04-14 HISTORY — PX: RECTAL EXAM UNDER ANESTHESIA: SHX6399

## 2022-04-14 SURGERY — EXAM UNDER ANESTHESIA, RECTUM
Anesthesia: General | Site: Anus

## 2022-04-14 MED ORDER — MIDAZOLAM HCL 2 MG/2ML IJ SOLN
INTRAMUSCULAR | Status: AC
  Filled 2022-04-14: qty 2

## 2022-04-14 MED ORDER — FENTANYL CITRATE (PF) 100 MCG/2ML IJ SOLN
INTRAMUSCULAR | Status: AC
  Filled 2022-04-14: qty 2

## 2022-04-14 MED ORDER — OXYCODONE HCL 5 MG PO TABS
ORAL_TABLET | ORAL | Status: AC
  Administered 2022-04-14: 5 mg via ORAL
  Filled 2022-04-14: qty 1

## 2022-04-14 MED ORDER — PROPOFOL 10 MG/ML IV BOLUS
INTRAVENOUS | Status: AC
  Filled 2022-04-14: qty 20

## 2022-04-14 MED ORDER — FENTANYL CITRATE (PF) 100 MCG/2ML IJ SOLN
INTRAMUSCULAR | Status: AC
  Administered 2022-04-14: 50 ug via INTRAVENOUS
  Filled 2022-04-14: qty 2

## 2022-04-14 MED ORDER — POLYETHYLENE GLYCOL 3350 17 G PO PACK: 17.0000 g | PACK | Freq: Every day | ORAL | 0 refills | Status: DC

## 2022-04-14 MED ORDER — GELATIN ABSORBABLE 100 CM EX MISC
CUTANEOUS | Status: AC
  Filled 2022-04-14: qty 1

## 2022-04-14 MED ORDER — FAMOTIDINE 20 MG PO TABS
ORAL_TABLET | ORAL | Status: AC
  Administered 2022-04-14: 20 mg via ORAL
  Filled 2022-04-14: qty 1

## 2022-04-14 MED ORDER — 0.9 % SODIUM CHLORIDE (POUR BTL) OPTIME
TOPICAL | Status: DC | PRN
  Administered 2022-04-14: 500 mL

## 2022-04-14 MED ORDER — METHYLENE BLUE 1 % INJ SOLN
INTRAVENOUS | Status: DC | PRN
  Administered 2022-04-14: 5 mL

## 2022-04-14 MED ORDER — LACTATED RINGERS IV SOLN: INTRAVENOUS | Status: DC

## 2022-04-14 MED ORDER — ACETAMINOPHEN 500 MG PO TABS
ORAL_TABLET | ORAL | Status: AC
  Administered 2022-04-14: 1000 mg via ORAL
  Filled 2022-04-14: qty 2

## 2022-04-14 MED ORDER — METHYLENE BLUE 1 % INJ SOLN
INTRAVENOUS | Status: AC
  Filled 2022-04-14: qty 10

## 2022-04-14 MED ORDER — FENTANYL CITRATE (PF) 100 MCG/2ML IJ SOLN
INTRAMUSCULAR | Status: DC | PRN
  Administered 2022-04-14: 100 ug via INTRAVENOUS

## 2022-04-14 MED ORDER — LIDOCAINE HCL (CARDIAC) PF 100 MG/5ML IV SOSY
PREFILLED_SYRINGE | INTRAVENOUS | Status: DC | PRN
  Administered 2022-04-14: 100 mg via INTRAVENOUS

## 2022-04-14 MED ORDER — ONDANSETRON HCL 4 MG/2ML IJ SOLN
INTRAMUSCULAR | Status: DC | PRN
  Administered 2022-04-14: 4 mg via INTRAVENOUS

## 2022-04-14 MED ORDER — OXYCODONE HCL 5 MG/5ML PO SOLN: 5.0000 mg | Freq: Once | ORAL | Status: AC | PRN

## 2022-04-14 MED ORDER — PROPOFOL 10 MG/ML IV BOLUS
INTRAVENOUS | Status: DC | PRN
  Administered 2022-04-14: 200 mg via INTRAVENOUS

## 2022-04-14 MED ORDER — CHLORHEXIDINE GLUCONATE 0.12 % MT SOLN: 15.0000 mL | Freq: Once | OROMUCOSAL | Status: AC

## 2022-04-14 MED ORDER — CELECOXIB 200 MG PO CAPS
ORAL_CAPSULE | ORAL | Status: AC
  Administered 2022-04-14: 200 mg via ORAL
  Filled 2022-04-14: qty 1

## 2022-04-14 MED ORDER — BUPIVACAINE-EPINEPHRINE (PF) 0.25% -1:200000 IJ SOLN
INTRAMUSCULAR | Status: AC
  Filled 2022-04-14: qty 30

## 2022-04-14 MED ORDER — KETOROLAC TROMETHAMINE 30 MG/ML IJ SOLN
INTRAMUSCULAR | Status: DC | PRN
  Administered 2022-04-14: 30 mg via INTRAVENOUS

## 2022-04-14 MED ORDER — BUPIVACAINE-EPINEPHRINE (PF) 0.25% -1:200000 IJ SOLN
INTRAMUSCULAR | Status: DC | PRN
  Administered 2022-04-14: 20 mL via INTRAMUSCULAR

## 2022-04-14 MED ORDER — CHLORHEXIDINE GLUCONATE 0.12 % MT SOLN
OROMUCOSAL | Status: AC
  Administered 2022-04-14: 15 mL via OROMUCOSAL
  Filled 2022-04-14: qty 15

## 2022-04-14 MED ORDER — DIBUCAINE 1 % EX OINT
TOPICAL_OINTMENT | CUTANEOUS | Status: DC | PRN
  Administered 2022-04-14: 1 via TOPICAL

## 2022-04-14 MED ORDER — ORAL CARE MOUTH RINSE: 15.0000 mL | Freq: Once | OROMUCOSAL | Status: AC

## 2022-04-14 MED ORDER — BUPIVACAINE LIPOSOME 1.3 % IJ SUSP
INTRAMUSCULAR | Status: AC
  Filled 2022-04-14: qty 10

## 2022-04-14 MED ORDER — GELATIN ABSORBABLE 100 CM EX MISC
CUTANEOUS | Status: DC | PRN
  Administered 2022-04-14: 1

## 2022-04-14 MED ORDER — OXYCODONE HCL 5 MG PO TABS: 5.0000 mg | ORAL_TABLET | Freq: Once | ORAL | Status: AC | PRN

## 2022-04-14 MED ORDER — DIBUCAINE (PERIANAL) 1 % EX OINT
TOPICAL_OINTMENT | CUTANEOUS | Status: AC
  Filled 2022-04-14: qty 28

## 2022-04-14 MED ORDER — MIDAZOLAM HCL 2 MG/2ML IJ SOLN
INTRAMUSCULAR | Status: DC | PRN
  Administered 2022-04-14: 2 mg via INTRAVENOUS

## 2022-04-14 MED ORDER — HYDROCODONE-ACETAMINOPHEN 5-325 MG PO TABS: 1.0000 | ORAL_TABLET | Freq: Four times a day (QID) | ORAL | 0 refills | Status: DC | PRN

## 2022-04-14 MED ORDER — FENTANYL CITRATE (PF) 100 MCG/2ML IJ SOLN
25.0000 ug | INTRAMUSCULAR | Status: DC | PRN
  Administered 2022-04-14: 50 ug via INTRAVENOUS

## 2022-04-14 MED ORDER — GABAPENTIN 300 MG PO CAPS
ORAL_CAPSULE | ORAL | Status: AC
  Administered 2022-04-14: 300 mg via ORAL
  Filled 2022-04-14: qty 1

## 2022-04-14 MED ORDER — LACTATED RINGERS IV SOLN: INTRAVENOUS | Status: DC | PRN

## 2022-04-14 SURGICAL SUPPLY — 31 items
BLADE SURG 15 STRL LF DISP TIS (BLADE) ×1 IMPLANT
BLADE SURG 15 STRL SS (BLADE) ×1
BRIEF MESH DISP 2XL (UNDERPADS AND DIAPERS) ×1 IMPLANT
DRAPE LAPAROTOMY 100X77 ABD (DRAPES) ×1 IMPLANT
DRAPE LEGGINS SURG 28X43 STRL (DRAPES) ×1 IMPLANT
DRSG GAUZE FLUFF 36X18 (GAUZE/BANDAGES/DRESSINGS) ×1 IMPLANT
ELECT CAUTERY BLADE TIP 2.5 (TIP) ×1
ELECT REM PT RETURN 9FT ADLT (ELECTROSURGICAL) ×1
ELECTRODE CAUTERY BLDE TIP 2.5 (TIP) ×1 IMPLANT
ELECTRODE REM PT RTRN 9FT ADLT (ELECTROSURGICAL) ×1 IMPLANT
GAUZE 4X4 16PLY ~~LOC~~+RFID DBL (SPONGE) ×1 IMPLANT
GLOVE ORTHO TXT STRL SZ7.5 (GLOVE) ×1 IMPLANT
GOWN STRL REUS W/ TWL LRG LVL3 (GOWN DISPOSABLE) ×1 IMPLANT
GOWN STRL REUS W/ TWL XL LVL3 (GOWN DISPOSABLE) ×1 IMPLANT
GOWN STRL REUS W/TWL LRG LVL3 (GOWN DISPOSABLE) ×1
GOWN STRL REUS W/TWL XL LVL3 (GOWN DISPOSABLE) ×1
KIT TURNOVER KIT A (KITS) ×1 IMPLANT
MANIFOLD NEPTUNE II (INSTRUMENTS) ×1 IMPLANT
NDL SAFETY ECLIP 18X1.5 (MISCELLANEOUS) ×1 IMPLANT
NEEDLE HYPO 22GX1.5 SAFETY (NEEDLE) ×1 IMPLANT
NS IRRIG 500ML POUR BTL (IV SOLUTION) IMPLANT
PACK BASIN MINOR ARMC (MISCELLANEOUS) ×1 IMPLANT
SOL PREP PVP 2OZ (MISCELLANEOUS) ×1
SOLUTION PREP PVP 2OZ (MISCELLANEOUS) ×1 IMPLANT
SURGILUBE 2OZ TUBE FLIPTOP (MISCELLANEOUS) ×1 IMPLANT
SUT CHROMIC 3 0 SH 27 (SUTURE) IMPLANT
SUT PROLENE 2 0 SH DA (SUTURE) IMPLANT
SUT PROLENE 3 0 SH DA (SUTURE) IMPLANT
SYR 10ML LL (SYRINGE) ×1 IMPLANT
TRAP FLUID SMOKE EVACUATOR (MISCELLANEOUS) ×1 IMPLANT
WATER STERILE IRR 500ML POUR (IV SOLUTION) ×1 IMPLANT

## 2022-04-14 NOTE — Anesthesia Procedure Notes (Signed)
Procedure Name: LMA Insertion Date/Time: 04/14/2022 2:51 PM  Performed by: Beverely Low, CRNAPre-anesthesia Checklist: Patient identified, Patient being monitored, Timeout performed, Emergency Drugs available and Suction available Patient Re-evaluated:Patient Re-evaluated prior to induction Oxygen Delivery Method: Circle system utilized Preoxygenation: Pre-oxygenation with 100% oxygen Induction Type: IV induction Ventilation: Mask ventilation without difficulty LMA: LMA inserted LMA Size: 5.0 Tube type: Oral Number of attempts: 1 Placement Confirmation: positive ETCO2 and breath sounds checked- equal and bilateral Tube secured with: Tape Dental Injury: Teeth and Oropharynx as per pre-operative assessment

## 2022-04-14 NOTE — Transfer of Care (Addendum)
Immediate Anesthesia Transfer of Care Note  Patient: David Wong  Procedure(s) Performed: RECTAL EXAM UNDER ANESTHESIA (Anus) FISTULOTOMY, intersphinctric (Anus)  Patient Location: PACU  Anesthesia Type:General  Level of Consciousness: drowsy  Airway & Oxygen Therapy: Patient Spontanous Breathing and Patient connected to face mask oxygen  Post-op Assessment: Report given to RN and Post -op Vital signs reviewed and stable  Post vital signs: Reviewed and stable  Last Vitals:  Vitals Value Taken Time  BP    Temp    Pulse    Resp    SpO2      Last Pain:  Vitals:   04/14/22 1042  TempSrc: Temporal  PainSc: 0-No pain         Complications: No notable events documented.

## 2022-04-14 NOTE — Anesthesia Preprocedure Evaluation (Signed)
Anesthesia Evaluation  Patient identified by MRN, date of birth, ID band Patient awake    Reviewed: Allergy & Precautions, NPO status , Patient's Chart, lab work & pertinent test results  History of Anesthesia Complications Negative for: history of anesthetic complications  Airway Mallampati: III  TM Distance: <3 FB Neck ROM: full    Dental  (+) Chipped   Pulmonary neg shortness of breath, sleep apnea , Patient abstained from smoking.,    Pulmonary exam normal        Cardiovascular (-) angina(-) Past MI negative cardio ROS Normal cardiovascular exam     Neuro/Psych PSYCHIATRIC DISORDERS negative neurological ROS     GI/Hepatic Neg liver ROS, GERD  Controlled,  Endo/Other  negative endocrine ROS  Renal/GU      Musculoskeletal   Abdominal   Peds  Hematology negative hematology ROS (+)   Anesthesia Other Findings Past Medical History: No date: Anemia No date: Anxiety No date: Depression No date: GERD (gastroesophageal reflux disease)     Comment:  occ  02/03/2020: IDA (iron deficiency anemia) No date: Sleep apnea     Comment:  does not have cpap  Past Surgical History: 10/08/2018: COLONOSCOPY WITH PROPOFOL; N/A     Comment:  Procedure: COLONOSCOPY WITH PROPOFOL;  Surgeon: Lin Landsman, MD;  Location: [facility] ENDOSCOPY;  Service:               Gastroenterology;  Laterality: N/A; 10/08/2018: ESOPHAGOGASTRODUODENOSCOPY (EGD) WITH PROPOFOL; N/A     Comment:  Procedure: ESOPHAGOGASTRODUODENOSCOPY (EGD) WITH               PROPOFOL;  Surgeon: Lin Landsman, MD;  Location:               [facility] ENDOSCOPY;  Service: Gastroenterology;  Laterality:               N/A; 11/17/2019: HEMORRHOID SURGERY; N/A     Comment:  Procedure: HEMORRHOIDECTOMY Internal & External;                Surgeon: Ronny Bacon, MD;  Location: [facility] ORS;                Service: General;  Laterality: N/A; 2013: right pinky  finger     Comment:  pins inserted  BMI    Body Mass Index: 32.28 kg/m      Reproductive/Obstetrics negative OB ROS                             Anesthesia Physical Anesthesia Plan  ASA: 3  Anesthesia Plan: General LMA   Post-op Pain Management:    Induction: Intravenous  PONV Risk Score and Plan: Dexamethasone, Ondansetron, Midazolam and Treatment may vary due to age or medical condition  Airway Management Planned: LMA  Additional Equipment:   Intra-op Plan:   Post-operative Plan: Extubation in OR  Informed Consent: I have reviewed the patients History and Physical, chart, labs and discussed the procedure including the risks, benefits and alternatives for the proposed anesthesia with the patient or authorized representative who has indicated his/her understanding and acceptance.     Dental Advisory Given  Plan Discussed with: Anesthesiologist, CRNA and Surgeon  Anesthesia Plan Comments: (Patient consented for risks of anesthesia including but not limited to:  - adverse reactions to medications - damage to eyes, teeth, lips or other oral mucosa -  nerve damage due to positioning  - sore throat or hoarseness - Damage to heart, brain, nerves, lungs, other parts of body or loss of life  Patient voiced understanding.)        Anesthesia Quick Evaluation

## 2022-04-14 NOTE — Op Note (Signed)
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    Surgeon: Ronny Bacon, M.D., [facility]  Anesthesia: General endotracheal  Findings: In the interim it appeared that there was diminishment in the external os, and I could not appreciate or define the internal communication.  However the sinus tract that was stained blue was laid open to its proximal limit.  Estimated Blood Loss: 15 mL         Specimens: None          Complications: none              Procedure Details  The patient was seen again in the Holding Room. The benefits, complications, treatment options, and expected outcomes were discussed with the patient. The risks of bleeding, infection, recurrence of symptoms, failure to resolve symptoms, unanticipated injury, prosthetic placement, prosthetic infection, any of which could require further surgery were reviewed with the patient. The likelihood of improving the patient's symptoms with return to their baseline status is anticipated.  The patient and/or family concurred with the proposed plan, giving informed consent.  The patient was taken to Operating Room, identified and the procedure verified.    Prior to the induction of general anesthesia, antibiotic prophylaxis was administered. VTE prophylaxis was in place.  General anesthesia was then administered and tolerated well. After the induction, the patient was positioned in the lithotomy position and the buttocks and perineum and anal area was prepped with Betadine and draped in the sterile fashion.  A Time Out was held and the above information confirmed.  Digital rectal exam was completed, I could not appreciate a palpable internal opening of the anticipated anal fistula.  The external opening was punctate, and I was able to place a 20-gauge angiocatheter within it, I entered stilled methylene blue with hydroperoxide, and  never was able to appreciate internal opening.  However with retractor removed blue dye was seen within the anal canal and distal rectum. With the lacrimal probe I was able to definitively identify an intersphincteric endpoint, and so I proceeded with laying open the fistulous tract with electrocautery.  Obtained adequate hemostasis, infiltrated the surrounding tissues with Exparel mixed with quarter percent Marcaine with epinephrine.  I then reexamined the area and could not find any further tract or tunneling beyond this point.  I believe half to two thirds of the internal sphincter was laid open, with no additional fistulous tract available to lay open wider. Having completed our objective, I confirmed adequate hemostasis and placed a Gelfoam saturated with Dibucaine ointment within the distal rectum and anal canal. Fluffs ABD and mesh briefs applied. Discharge instructions given.  Patient tolerated procedure well.      Ronny Bacon M.D., [facility] 04/14/2022 3:51 PM

## 2022-04-14 NOTE — Discharge Instructions (Addendum)
AMBULATORY SURGERY  ?DISCHARGE INSTRUCTIONS ? ? ?The drugs that you were given will stay in your system until tomorrow so for the next 24 hours you should not: ? ?Drive an automobile ?Make any legal decisions ?Drink any alcoholic beverage ? ? ?You may resume regular meals tomorrow.  Today it is better to start with liquids and gradually work up to solid foods. ? ?You may eat anything you prefer, but it is better to start with liquids, then soup and crackers, and gradually work up to solid foods. ? ? ?Please notify your doctor immediately if you have any unusual bleeding, trouble breathing, redness and pain at the surgery site, drainage, fever, or pain not relieved by medication. ? ? ? ?Additional Instructions: ? ? ? ?Please contact your physician with any problems or Same Day Surgery at [phone], Monday through Friday 6 am to 4 pm, or [place] [facility] [place] Main number at [phone].  ?

## 2022-04-14 NOTE — Anesthesia Postprocedure Evaluation (Signed)
Anesthesia Post Note  Patient: David Wong  Procedure(s) Performed: RECTAL EXAM UNDER ANESTHESIA (Anus) FISTULOTOMY, intersphinctric (Anus)  Patient location during evaluation: PACU Anesthesia Type: General Level of consciousness: awake and alert, oriented and patient cooperative Pain management: pain level controlled Vital Signs Assessment: post-procedure vital signs reviewed and stable Respiratory status: spontaneous breathing, nonlabored ventilation and respiratory function stable Cardiovascular status: blood pressure returned to baseline and stable Postop Assessment: adequate PO intake Anesthetic complications: no   No notable events documented.   Last Vitals:  Vitals:   04/14/22 1620 04/14/22 1625  BP: (!) 117/94   Pulse: (!) 56 (!) 54  Resp: 13 16  Temp: (!) 36.3 C   SpO2: 99% 100%    Last Pain:  Vitals:   04/14/22 1625  TempSrc:   PainSc: [place]

## 2022-04-14 NOTE — Interval H&P Note (Signed)
History and Physical Interval Note:  04/14/2022 12:37 PM  David Wong  has presented today for surgery, with the diagnosis of Anal fistula K60.3.  The various methods of treatment have been discussed with the patient and family. After consideration of risks, benefits and other options for treatment, the patient has consented to  Procedure(s): RECTAL EXAM UNDER ANESTHESIA (N/A) FISTULOTOMY, intersphinctric (N/A) as a surgical intervention.  The patient's history has been reviewed, patient examined, no change in status, stable for surgery.  I have reviewed the patient's chart and labs.  Questions were answered to the patient's satisfaction.     Ronny Bacon

## 2022-04-15 ENCOUNTER — Encounter: Payer: Self-pay | Admitting: Surgery

## 2022-04-27 ENCOUNTER — Ambulatory Visit (INDEPENDENT_AMBULATORY_CARE_PROVIDER_SITE_OTHER): Payer: BC Managed Care – PPO | Admitting: Surgery

## 2022-04-27 ENCOUNTER — Encounter: Payer: Self-pay | Admitting: Surgery

## 2022-04-27 VITALS — BP 117/74 | HR 71 | Temp 98.0°F | Wt 218.0 lb

## 2022-04-27 DIAGNOSIS — Z09 Encounter for follow-up examination after completed treatment for conditions other than malignant neoplasm: Secondary | ICD-10-CM

## 2022-04-27 DIAGNOSIS — K603 Anal fistula: Secondary | ICD-10-CM

## 2022-04-27 NOTE — Patient Instructions (Addendum)
If you have any concerns or questions, please feel free to call our office. See follow up appointment below.   Anal Fistulotomy, Care After This sheet gives you information about how to care for yourself after your procedure. Your health care provider may also give you more specific instructions. If you have problems or questions, contact your health care provider. What can I expect after the procedure? After the procedure, it is common to have: Some pain, discomfort, and swelling. Increased pain during bowel movements. Some bleeding from the incision area. Some leakage of stool. Follow these instructions at home: Medicines Take over-the-counter and prescription medicines only as told by your health care provider. If you were prescribed an antibiotic medicine, take it as told by your health care provider. Do not stop taking the antibiotic even if you start to feel better. Ask your health care provider if the medicine prescribed to you requires you to avoid driving or using heavy machinery. Incision care  Follow instructions from your health care provider about how to take care of your incision. Make sure you: Wash your hands with soap and water before and after you remove your gauze (dressing). If soap and water are not available, use hand sanitizer. Remove your dressing as told by your health care provider. In some cases, you may be told not to remove the dressing, but to allow it to come out with your first bowel movement after surgery. Leave stitches (sutures), skin glue, or adhesive strips in place. These skin closures may need to stay in place for 2 weeks or longer. Do not remove adhesive strips completely unless your health care provider tells you to do that. Keep the incision area clean and dry. Check your incision area every day for signs of infection. Check for: More redness, swelling, or pain. More fluid or blood. Warmth. Pus or a bad smell. Self-care After a bowel movement,  clean the incision area using one of the following methods: Gently wipe with a moist towelette. Gently wipe with mild soap and water. Take a shower. Take a sitz bath. This is a shallow, warm-water bath that attaches to the toilet bowl. You can also sit in a bathtub filled with warm water. Do not swim or use a hot tub until your health care provider approves. To reduce discomfort, you may apply ice to the incision area. To do this: Put ice in a plastic bag. Place a towel between your skin and the bag. Leave the ice on for 20 minutes, 2-3 times a day. Activity  Rest as told by your health care provider. Avoid sitting for a long time without moving. Get up to take short walks every 1-2 hours. This is important to improve blood flow and breathing. Ask for help if you feel weak or unsteady. Do not drive for 24 hours if you were given a sedative during your procedure. Do not lift anything that is heavier than 10 lb (4.5 kg), or the limit that you are told, until your health care provider says that it is safe. Return to your normal activities as told by your health care provider. Ask your health care provider what activities are safe for you. Eating and drinking Follow instructions from your health care provider about eating or drinking restrictions. You may need to take these actions to prevent or treat constipation: Drink enough fluid to keep your urine pale yellow. Eat foods that are high in fiber, such as beans, whole grains, and fresh fruits and vegetables. Limit foods  that are high in fat and processed sugars, such as fried or sweet foods. Lifestyle Do not use any products that contain nicotine or tobacco, such as cigarettes, e-cigarettes, and chewing tobacco. If you need help quitting, ask your health care provider. Do not drink alcohol if: Your health care provider tells you not to drink. You are pregnant, may be pregnant, or are planning to become pregnant. If you drink alcohol: Limit  how much you use to: 0-1 drink a day for women. 0-2 drinks a day for men. Be aware of how much alcohol is in your drink. In the [place] one drink equals one 12 oz bottle of beer (355 mL), one 5 oz glass of wine (148 mL), or one 1 oz glass of hard liquor (44 mL). General instructions If you have bleeding from the incision area, wear a pad to absorb blood. Change it often. Keep all follow-up visits as told by your health care provider. This is important. Contact a health care provider if: You have any of these signs of infection: More redness, swelling, or pain around your incision area. Warmth coming from your incision area, or your incision area is firm. Pus or a bad smell coming from your incision area. A fever or chills. You develop swelling or tenderness in your groin area. You cannot control your bowel movements (incontinence), or you are leaking stool. You have trouble urinating. You have pain that does not get better with medicine. Get help right away if: You have severe pain in your abdomen or incision area. You have sudden chest pain. You become weak or you faint. You have more fluid or blood coming from your incision. You have bleeding from your incision that soaks 2 or more pads during 24 hours. Summary After anal fistulotomy, it is common to have increased pain during bowel movements and some bleeding. If a dressing was placed in your incision during surgery, remove it as told by your health care provider. It is important to keep the incision area clean and dry after each bowel movement. If you have bleeding from the incision area, wear a pad to absorb blood. Change it often. This information is not intended to replace advice given to you by your health care provider. Make sure you discuss any questions you have with your health care provider. Document Revised: 12/30/2018 Document Reviewed: 12/30/2018 Elsevier Patient Education  [place].

## 2022-04-27 NOTE — Progress Notes (Signed)
[facility] POST-OP OFFICE VISIT  04/27/2022  HPI: David Wong is a 36 y.o. male 13 days s/p intra sphincteric anal fistulotomy.  No fevers, no chills.  Reports regular bowel activity.  Performing sitz bath's 3 times daily and with every bowel movement.  Reports a small amount of mucousy discharge, reports good fecal and flatus continence.  Vital signs: BP 117/74   Pulse 71   Temp 98 F (36.7 C) (Oral)   Wt 218 lb (98.9 kg)   SpO2 99%   BMI 31.28 kg/m    Physical Exam: Constitutional: Appears well Abdomen: Benign Skin: Wound as expected anterior left quadrant clean with mucousy discharge.  Surrounding perianal skin appears normal without induration or erythema.  No remarkable tenderness.  Assessment/Plan: This is a 35 y.o. male 13 days s/p intersphincteric anal fistulotomy. Continue sitz bath's cleansing, expectant healing may take weeks.  Patient Active Problem List   Diagnosis Date Noted   Anal fistula 03/16/2022   Left shoulder pain 01/30/2022   IDA (iron deficiency anemia) 02/03/2020   Obesity 01/23/2020   Hemorrhoids    Depression, major, single episode, in partial remission ([place]) 06/29/2017   Sleep apnea 06/29/2017   Anxiety 01/08/2015    -May return to work.  Follow-up in 6 weeks or as needed.   Ronny Bacon M.D., FACS 04/27/2022, 9:37 AM

## 2022-06-01 ENCOUNTER — Encounter: Payer: Self-pay | Admitting: Oncology

## 2022-06-08 ENCOUNTER — Encounter: Payer: Self-pay | Admitting: Surgery

## 2022-07-06 ENCOUNTER — Encounter: Payer: Self-pay | Admitting: Oncology

## 2022-08-13 DIAGNOSIS — E7800 Pure hypercholesterolemia, unspecified: Secondary | ICD-10-CM | POA: Insufficient documentation

## 2022-08-13 NOTE — Patient Instructions (Incomplete)

## 2022-08-15 ENCOUNTER — Encounter: Payer: Self-pay | Admitting: Nurse Practitioner

## 2022-08-15 DIAGNOSIS — E6609 Other obesity due to excess calories: Secondary | ICD-10-CM

## 2022-08-15 DIAGNOSIS — F419 Anxiety disorder, unspecified: Secondary | ICD-10-CM

## 2022-08-15 DIAGNOSIS — Z1159 Encounter for screening for other viral diseases: Secondary | ICD-10-CM

## 2022-08-15 DIAGNOSIS — G4733 Obstructive sleep apnea (adult) (pediatric): Secondary | ICD-10-CM

## 2022-08-15 DIAGNOSIS — D500 Iron deficiency anemia secondary to blood loss (chronic): Secondary | ICD-10-CM

## 2022-08-15 DIAGNOSIS — E7800 Pure hypercholesterolemia, unspecified: Secondary | ICD-10-CM

## 2022-08-15 DIAGNOSIS — Z0000 Encounter for general adult medical examination without abnormal findings: Secondary | ICD-10-CM

## 2022-08-15 DIAGNOSIS — F324 Major depressive disorder, single episode, in partial remission: Secondary | ICD-10-CM

## 2022-08-24 ENCOUNTER — Other Ambulatory Visit: Payer: Self-pay | Admitting: Nurse Practitioner

## 2022-08-24 NOTE — Telephone Encounter (Signed)
Called patient to schedule appt for medication refills. No answer, LVMTCB [phone].

## 2022-08-24 NOTE — Telephone Encounter (Signed)
Patient is overdue for appointment. Please call to schedule.

## 2022-08-24 NOTE — Telephone Encounter (Signed)
Requested medication (s) are due for refill today: expired medication  Requested medication (s) are on the active medication list: yes  Last refill:  08/02/21 #90 4 refills  Future visit scheduled: no  Notes to clinic:  expired medication . Called patient to schedule appt for med refills. No answer LVMTCB. Do you want to renew Rx?     Requested Prescriptions  Pending Prescriptions Disp Refills   citalopram (CELEXA) 20 MG tablet [Pharmacy Med Name: CITALOPRAM '20MG'$  TABLETS] 90 tablet 4    Sig: TAKE 1 TABLET(20 MG) BY MOUTH DAILY     Psychiatry:  Antidepressants - SSRI Failed - 08/24/2022  3:47 AM      Failed - Valid encounter within last 6 months    Recent Outpatient Visits           6 months ago Depression, major, single episode, in partial remission ([place])   [place], [place] T, NP   1 year ago Depression, major, single episode, in partial remission ([place])   [place], Henrine Screws T, NP   1 year ago [place], Scheryl Darter, NP   1 year ago Grade I hemorrhoids   [place], [place] T, NP   2 years ago Left wrist pain   [place], [place] T, NP              Passed - Completed PHQ-2 or PHQ-9 in the last 360 days

## 2022-08-25 NOTE — Telephone Encounter (Signed)
LVM asking patient to call back to schedule an appointment 

## 2022-08-29 ENCOUNTER — Encounter: Payer: Self-pay | Admitting: Nurse Practitioner

## 2022-08-29 ENCOUNTER — Telehealth: Payer: Self-pay | Admitting: Nurse Practitioner

## 2022-08-29 NOTE — Telephone Encounter (Signed)
Created in error

## 2022-08-29 NOTE — Telephone Encounter (Signed)
Patient called back and scheduled an appointment for 09/01/22 at 4pm.  Patient is wondering if he could get a short supply of medication to last him until his appointment. Per patient he has been out of medication for 2 days.  citalopram (CELEXA) 20 MG tablet   Please advise.

## 2022-08-29 NOTE — Telephone Encounter (Signed)
2nd attempt to reach patient to schedule an appointment

## 2022-09-01 ENCOUNTER — Telehealth (INDEPENDENT_AMBULATORY_CARE_PROVIDER_SITE_OTHER): Payer: Self-pay | Admitting: Nurse Practitioner

## 2022-09-01 ENCOUNTER — Encounter: Payer: Self-pay | Admitting: Nurse Practitioner

## 2022-09-01 DIAGNOSIS — F324 Major depressive disorder, single episode, in partial remission: Secondary | ICD-10-CM

## 2022-09-01 DIAGNOSIS — F419 Anxiety disorder, unspecified: Secondary | ICD-10-CM

## 2022-09-01 MED ORDER — BUSPIRONE HCL 5 MG PO TABS: 5.0000 mg | ORAL_TABLET | Freq: Two times a day (BID) | ORAL | 8 refills | Status: DC | PRN

## 2022-09-01 MED ORDER — CITALOPRAM HYDROBROMIDE 40 MG PO TABS: 40.0000 mg | ORAL_TABLET | Freq: Every day | ORAL | 5 refills | Status: DC

## 2022-09-01 NOTE — Progress Notes (Signed)
There were no vitals taken for this visit.   Subjective:    Patient ID: David Wong, male    DOB: 04/29/1986, 37 y.o.   MRN: [id number]  HPI: David Wong is a 37 y.o. male  Chief Complaint  Patient presents with   Medication Management   Depression   Panic Attack    Wednesday, lips went numb, legs got weak, and heart rate when up, chest pain, lasted about 1 hr.   This visit was completed via video visit through MyChart due to the restrictions of the COVID-19 pandemic. All issues as above were discussed and addressed. Physical exam was done as above through visual confirmation on video through MyChart. If it was felt that the patient should be evaluated in the office, they were directed there. The patient verbally consented to this visit. Location of the patient: home Location of the provider: work Those involved with this call:  Provider: Marnee Guarneri, DNP CMA: Frazier Butt, [place] Desk/Registration: [employer]  Time spent on call:  21 minutes with patient face to face via video conference. More than 50% of this time was spent in counseling and coordination of care. 15 minutes total spent in review of patient's record and preparation of their chart.  I verified patient identity using two factors (patient name and date of birth). Patient consents verbally to being seen via telemedicine visit today.    ANXIETY/STRESS & DEPRESSION Currently taking Celexa 20 MG daily which he has been on since prior to 2019.  Has been under a lot of stress with work.  Having more anxiety over past 2-3 weeks, got really bad on Wednesday.  Had panic attack at work that lasted 1 to 1 1/2 hours.  He is not sure what brought this on, is trying to get promotion at work. Duration:uncontrolled Anxious mood: yes  Excessive worrying: yes Irritability: no  Sweating: yes x one recently Nausea: yes with panic attack recently Palpitations:no Hyperventilation: yes with panic attack recently Panic  attacks: had one recently, none in many years prior to this Agoraphobia: no  Obscessions/compulsions: no Depressed mood: no    04-Sep-2022   10:01 AM 01/30/2022    8:49 AM 08/02/2021    3:55 PM 05/31/2021   10:24 AM 04/27/2020    8:04 AM  Depression screen PHQ 2/9  Decreased Interest 0 0 0 0 1  Down, Depressed, Hopeless 0 0 '1 1 1  '$ PHQ - 2 Score 0 0 '1 1 2  '$ Altered sleeping '1 1 1 2 2  '$ Tired, decreased energy 0 3 1 0 0  Change in appetite 0 0 0 0 0  Feeling bad or failure about yourself  0 1 0 1 0  Trouble concentrating 1 0 0 0 0  Moving slowly or fidgety/restless 0 0 0 0 0  Suicidal thoughts 0 0 0 0 0  PHQ-9 Score '2 5 3 4 4  '$ Difficult doing work/chores Not difficult at all Not difficult at all Not difficult at all Not difficult at all   Anhedonia: no Weight changes: no Insomnia: yes hard to fall asleep  Hypersomnia: no Fatigue/loss of energy: no Feelings of worthlessness: no Feelings of guilt: no Impaired concentration/indecisiveness: no Suicidal ideations: no  Crying spells: no Recent Stressors/Life Changes: yes   Relationship problems: no   Family stress: no     Financial stress: no    Job stress: yes    Recent death/loss: no     Sep 04, 2022   10:02 AM  01/30/2022    8:50 AM 08/02/2021    3:55 PM 05/31/2021   10:25 AM  GAD 7 : Generalized Anxiety Score  Nervous, Anxious, on Edge 2 0 0 1  Control/stop worrying '1 1 1 1  '$ Worry too much - different things '1 2 1 '$ 0  Trouble relaxing 0 0 0 1  Restless 0 0 0 0  Easily annoyed or irritable 0 0 1 1  Afraid - awful might happen 0 0 0 0  Total GAD 7 Score '4 3 3 4  '$ Anxiety Difficulty Not difficult at all Not difficult at all Somewhat difficult Somewhat difficult   Relevant past medical, surgical, family and social history reviewed and updated as indicated. Interim medical history since our last visit reviewed. Allergies and medications reviewed and updated.  Review of Systems  Constitutional:  Negative for activity change, diaphoresis,  fatigue and fever.  Respiratory:  Negative for cough, chest tightness, shortness of breath and wheezing.   Cardiovascular:  Negative for chest pain, palpitations and leg swelling.  Gastrointestinal: Negative.   Neurological: Negative.   Psychiatric/Behavioral:  Positive for sleep disturbance. Negative for decreased concentration, self-injury and suicidal ideas. The patient is nervous/anxious.     Per HPI unless specifically indicated above     Objective:    There were no vitals taken for this visit.  Wt Readings from Last 3 Encounters:  04/27/22 218 lb (98.9 kg)  04/14/22 225 lb (102.1 kg)  04/05/22 225 lb (102.1 kg)    Physical Exam Vitals and nursing note reviewed.  Constitutional:      General: He is awake. He is not in acute distress.    Appearance: He is well-developed. He is not ill-appearing.  HENT:     Head: Normocephalic.     Right Ear: Hearing normal. No drainage.     Left Ear: Hearing normal. No drainage.  Eyes:     General: Lids are normal.        Right eye: No discharge.        Left eye: No discharge.     Conjunctiva/sclera: Conjunctivae normal.  Pulmonary:     Effort: Pulmonary effort is normal. No accessory muscle usage or respiratory distress.  Musculoskeletal:     Cervical back: Normal range of motion.  Neurological:     Mental Status: He is alert and oriented to person, place, and time.  Psychiatric:        Mood and Affect: Mood normal.        Behavior: Behavior normal. Behavior is cooperative.        Thought Content: Thought content normal.        Judgment: Judgment normal.     Results for orders placed or performed in visit on 05/31/21  CBC with Differential/Platelet  Result Value Ref Range   WBC 7.8 3.4 - 10.8 x10E3/uL   RBC 4.95 4.14 - 5.80 x10E6/uL   Hemoglobin 16.1 13.0 - 17.7 g/dL   Hematocrit 46.2 37.5 - 51.0 %   MCV 93 79 - 97 fL   MCH 32.5 26.6 - 33.0 pg   MCHC 34.8 31.5 - 35.7 g/dL   RDW 12.7 11.6 - 15.4 %   Platelets 196 150 -  450 x10E3/uL   Neutrophils 51 Not Estab. %   Lymphs 33 Not Estab. %   Monocytes 10 Not Estab. %   Eos 5 Not Estab. %   Basos 1 Not Estab. %   Neutrophils Absolute 4.0 1.4 - 7.0 x10E3/uL   Lymphocytes  Absolute 2.5 0.7 - 3.1 x10E3/uL   Monocytes Absolute 0.8 0.1 - 0.9 x10E3/uL   EOS (ABSOLUTE) 0.4 0.0 - 0.4 x10E3/uL   Basophils Absolute 0.1 0.0 - 0.2 x10E3/uL   Immature Granulocytes 0 Not Estab. %   Immature Grans (Abs) 0.0 0.0 - 0.1 x10E3/uL  Comprehensive metabolic panel  Result Value Ref Range   Glucose 76 70 - 99 mg/dL   BUN 14 6 - 20 mg/dL   Creatinine, Ser 0.97 0.76 - 1.27 mg/dL   eGFR 105 >59 mL/min/1.73   BUN/Creatinine Ratio 14 9 - 20   Sodium 138 134 - 144 mmol/L   Potassium 4.4 3.5 - 5.2 mmol/L   Chloride 106 96 - 106 mmol/L   CO2 19 (L) 20 - 29 mmol/L   Calcium 10.0 8.7 - 10.2 mg/dL   Total Protein 7.5 6.0 - 8.5 g/dL   Albumin 4.8 4.0 - 5.0 g/dL   Globulin, Total 2.7 1.5 - 4.5 g/dL   Albumin/Globulin Ratio 1.8 1.2 - 2.2   Bilirubin Total 0.3 0.0 - 1.2 mg/dL   Alkaline Phosphatase 104 44 - 121 IU/L   AST 53 (H) 0 - 40 IU/L   ALT 125 (H) 0 - 44 IU/L  TSH  Result Value Ref Range   TSH 2.680 0.450 - 4.500 uIU/mL  Bayer DCA Hb A1c Waived  Result Value Ref Range   HB A1C (BAYER DCA - WAIVED) 5.4 4.8 - 5.6 %      Assessment & Plan:   Problem List Items Addressed This Visit       Other   Anxiety    Refer to depression plan of care.      Relevant Medications   citalopram (CELEXA) 40 MG tablet   busPIRone (BUSPAR) 5 MG tablet   Depression, major, single episode, in partial remission (HCC) - Primary    Chronic, stable. Denies SI/HI.  Increased anxiety at this time.  Will increase Celexa to 40 MG daily + add on Buspar 5 MG BID PRN for increased anxiety episodes.  Educated him on this and discussed that if in future anxiety at work decreases then we can reduce back down on Celexa.  Discussed deep breathing exercises.  Return in 6 weeks.      Relevant Medications    citalopram (CELEXA) 40 MG tablet   busPIRone (BUSPAR) 5 MG tablet    I discussed the assessment and treatment plan with the patient. The patient was provided an opportunity to ask questions and all were answered. The patient agreed with the plan and demonstrated an understanding of the instructions.   The patient was advised to call back or seek an in-person evaluation if the symptoms worsen or if the condition fails to improve as anticipated.   I provided 21+ minutes of time during this encounter.    Follow up plan: Return in about 6 weeks (around 10/13/2022) for Anxiety -- increased Celexa to 40 MG.

## 2022-09-01 NOTE — Progress Notes (Signed)
LVM asking patient to call back to schedule an appointment 

## 2022-09-01 NOTE — Patient Instructions (Signed)

## 2022-09-01 NOTE — Assessment & Plan Note (Signed)
Chronic, stable. Denies SI/HI.  Increased anxiety at this time.  Will increase Celexa to 40 MG daily + add on Buspar 5 MG BID PRN for increased anxiety episodes.  Educated him on this and discussed that if in future anxiety at work decreases then we can reduce back down on Celexa.  Discussed deep breathing exercises.  Return in 6 weeks.

## 2022-09-01 NOTE — Assessment & Plan Note (Signed)
Refer to depression plan of care.

## 2022-09-29 ENCOUNTER — Ambulatory Visit: Payer: Self-pay | Admitting: *Deleted

## 2022-09-29 MED ORDER — CITALOPRAM HYDROBROMIDE 20 MG PO TABS: 20.0000 mg | ORAL_TABLET | Freq: Every day | ORAL | 5 refills | Status: DC

## 2022-09-29 NOTE — Telephone Encounter (Signed)
Message from Roslynn Amble sent at 09/29/2022  9:55 AM EST  Summary: citalopram (CELEXA) 40 MG tablet   The patient called in stating he cannot tolerate the citalopram (CELEXA) 40 MG tablet as it makes him feel like a zombie. He would like to switch back to the 20 mg if possible. Please assist patient further. He uses  [facility] [phone] - [place], [place] - [address] AT [address] Phone: [phone] Fax: [phone]          Call History   Type Contact Phone/Fax User  09/29/2022 09:52 AM EST Phone (Incoming) David Wong, David Wong (Self) [phone] Lemmie Evens) Roslynn Amble   Reason for Disposition  [1] Caller has URGENT medicine question about med that PCP or specialist prescribed AND [2] triager unable to answer question  Answer Assessment - Initial Assessment Questions 1. NAME of MEDICINE: "What medicine(s) are you calling about?"     See phone call note from pt   The Celexa 40 mg makes him "feel like a zombie" 2. QUESTION: "What is your question?" (e.g., double dose of medicine, side effect)     Can he switch back to the 20 mg dose. 3. PRESCRIBER: "Who prescribed the medicine?" Reason: if prescribed by specialist, call should be referred to that group.     Marnee Guarneri, NP 4. SYMPTOMS: "Do you have any symptoms?" If Yes, ask: "What symptoms are you having?"  "How bad are the symptoms (e.g., mild, moderate, severe)     See note 5. PREGNANCY:  "Is there any chance that you are pregnant?" "When was your last menstrual period?"     N/A  Protocols used: Medication Question Call-A-AH

## 2022-09-29 NOTE — Telephone Encounter (Signed)
Lvm for patient to return call.

## 2022-09-29 NOTE — Telephone Encounter (Signed)
  Chief Complaint: Celexa 40 mg makes him "feel like a zombie" Symptoms: Feels like a zombie Frequency: Since switching to 40 mg from 20 mg. Pertinent Negatives: Patient denies N/A Disposition: '[]'$ ED /'[]'$ Urgent Care (no appt availability in office) / '[]'$ Appointment(In office/virtual)/ '[]'$  [facility]/ '[]'$ Home Care/ '[]'$ Refused Recommended Disposition /'[]'$ [facility]/ '[x]'$  Follow-up with PCP Additional Notes: Message from pt. Sent to Marnee Guarneri, NP.    He is requesting to switch back to the 20 mg dose.

## 2022-09-29 NOTE — Addendum Note (Signed)
Addended by: Marnee Guarneri T on: 09/29/2022 12:57 PM   Modules accepted: Orders

## 2022-12-05 ENCOUNTER — Ambulatory Visit (INDEPENDENT_AMBULATORY_CARE_PROVIDER_SITE_OTHER): Payer: PRIVATE HEALTH INSURANCE | Admitting: Family Medicine

## 2022-12-05 ENCOUNTER — Encounter: Payer: Self-pay | Admitting: Oncology

## 2022-12-05 VITALS — BP 118/83 | HR 60 | Temp 98.1°F | Wt 221.6 lb

## 2022-12-05 DIAGNOSIS — M545 Low back pain, unspecified: Secondary | ICD-10-CM | POA: Diagnosis not present

## 2022-12-05 MED ORDER — NAPROXEN 500 MG PO TABS: 500.0000 mg | ORAL_TABLET | Freq: Two times a day (BID) | ORAL | 1 refills | Status: DC | PRN

## 2022-12-05 MED ORDER — TIZANIDINE HCL 2 MG PO CAPS: 2.0000 mg | ORAL_CAPSULE | Freq: Every evening | ORAL | 0 refills | Status: DC | PRN

## 2022-12-05 MED ORDER — KETOROLAC TROMETHAMINE 30 MG/ML IJ SOLN: 30.0000 mg | Freq: Once | INTRAMUSCULAR | Status: DC

## 2022-12-05 MED ORDER — KETOROLAC TROMETHAMINE 60 MG/2ML IM SOLN
60.0000 mg | Freq: Once | INTRAMUSCULAR | Status: AC
  Administered 2022-12-05: 30 mg via INTRAMUSCULAR

## 2022-12-05 NOTE — Assessment & Plan Note (Addendum)
Acute, ongoing. Torodol 30 mg given in office today. Naproxen 500 mg ordered q12 PRN along with Zanaflex 2 mg PRN nightly for over a week. Provided back exercises and recommend continued stretching to prevent stiffness. F/u as needed if symptoms worsen for fail to improve. If no improvement in over a week will consider referral to physical therapy. Provided work note.

## 2022-12-05 NOTE — Progress Notes (Signed)
BP 118/83   Pulse 60   Temp 98.1 F (36.7 C)   Wt 221 lb 9.6 oz (100.5 kg)   SpO2 98%   BMI 31.80 kg/m    Subjective:    Patient ID: David Wong, male    DOB: 1986-04-09, 37 y.o.   MRN: [id number]  HPI: David Wong is a 37 y.o. male  Chief Complaint  Patient presents with   Back Pain    Right sided that started 1.5 week ago.    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  Location: Right and low back Onset: sudden, awoke last Sunday and was in pain, he has never had this back pain before.  Severity: 7/10 Quality: Dull, burning, stabbing  Frequency: constant, it woke him up last night Radiation: to the center of the lower back, occasionally down above his right knee Aggravating factors: walking, bending, prolonged sitting, and coughing Alleviating factors:  Tylenol, heat, and NSAIDs, helped for a little bit but did not go away completely, taking medication every other day.  Status: worse Treatments attempted: rest, heat, and ibuprofen and tylenol Relief with NSAIDs?:Mild Nighttime pain:  yes Paresthesias / decreased sensation:  no Bowel / bladder incontinence:  no Fevers:  no Dysuria / urinary frequency:  no   Relevant past medical, surgical, family and social history reviewed and updated as indicated. Interim medical history since our last visit reviewed. Allergies and medications reviewed and updated.  Review of Systems  Constitutional:  Negative for chills and fever.  Respiratory: Negative.    Cardiovascular: Negative.   Genitourinary:  Negative for dysuria, flank pain and frequency.  Musculoskeletal:  Positive for back pain.       Back pain causing walking limp  Neurological:  Negative for numbness.    Per HPI unless specifically indicated above     Objective:    BP 118/83   Pulse 60   Temp 98.1 F (36.7 C)   Wt 221 lb 9.6 oz (100.5 kg)   SpO2 98%   BMI 31.80 kg/m    Wt Readings from Last 3 Encounters:  12/05/22 221 lb 9.6 oz (100.5 kg)  04/27/22 218 lb (98.9 kg)  04/14/22 225 lb (102.1 kg)    Physical Exam Vitals and nursing note reviewed.  Constitutional:      General: He is awake. He is not in acute distress.    Appearance: Normal appearance. He is well-developed and well-groomed. He is obese. He is not ill-appearing.  HENT:     Head: Normocephalic and atraumatic.     Right Ear: Hearing and external ear normal. No drainage.     Left Ear: Hearing and external ear normal. No drainage.     Nose: Nose normal.  Eyes:     General: Lids are normal.        Right eye: No discharge.        Left eye: No discharge.     Conjunctiva/sclera: Conjunctivae normal.  Cardiovascular:     Rate and Rhythm: Normal rate and regular rhythm.     Heart sounds: Normal heart sounds, S1 normal and S2 normal. No murmur heard.    No gallop.  Pulmonary:     Effort: Pulmonary effort is normal. No accessory muscle usage or respiratory distress.     Breath sounds: Normal breath sounds.  Musculoskeletal:     Cervical back: Full passive range of motion  without pain and normal range of motion.     Lumbar back: Tenderness present. Decreased range of motion.     Right lower leg: No edema.     Left lower leg: No edema.     Comments: Right lower back lumbar tenderness on palpation  Skin:    General: Skin is warm and dry.     Capillary Refill: Capillary refill takes less than 2 seconds.  Neurological:     Mental Status: He is alert and oriented to person, place, and time.  Psychiatric:        Attention and Perception: Attention normal.        Mood and Affect: Mood normal.        Speech: Speech normal.        Behavior: Behavior normal. Behavior is cooperative.        Thought Content: Thought content normal.     Results for orders placed or performed in visit on 05/31/21  CBC with Differential/Platelet  Result Value Ref Range   WBC 7.8 3.4 - 10.8 x10E3/uL   RBC 4.95  4.14 - 5.80 x10E6/uL   Hemoglobin 16.1 13.0 - 17.7 g/dL   Hematocrit 09.8 11.9 - 51.0 %   MCV 93 79 - 97 fL   MCH 32.5 26.6 - 33.0 pg   MCHC 34.8 31.5 - 35.7 g/dL   RDW 14.7 82.9 - 56.2 %   Platelets 196 150 - 450 x10E3/uL   Neutrophils 51 Not Estab. %   Lymphs 33 Not Estab. %   Monocytes 10 Not Estab. %   Eos 5 Not Estab. %   Basos 1 Not Estab. %   Neutrophils Absolute 4.0 1.4 - 7.0 x10E3/uL   Lymphocytes Absolute 2.5 0.7 - 3.1 x10E3/uL   Monocytes Absolute 0.8 0.1 - 0.9 x10E3/uL   EOS (ABSOLUTE) 0.4 0.0 - 0.4 x10E3/uL   Basophils Absolute 0.1 0.0 - 0.2 x10E3/uL   Immature Granulocytes 0 Not Estab. %   Immature Grans (Abs) 0.0 0.0 - 0.1 x10E3/uL  Comprehensive metabolic panel  Result Value Ref Range   Glucose 76 70 - 99 mg/dL   BUN 14 6 - 20 mg/dL   Creatinine, Ser 1.30 0.76 - 1.27 mg/dL   eGFR 865 >78 IO/NGE/9.52   BUN/Creatinine Ratio 14 9 - 20   Sodium 138 134 - 144 mmol/L   Potassium 4.4 3.5 - 5.2 mmol/L   Chloride 106 96 - 106 mmol/L   CO2 19 (L) 20 - 29 mmol/L   Calcium 10.0 8.7 - 10.2 mg/dL   Total Protein 7.5 6.0 - 8.5 g/dL   Albumin 4.8 4.0 - 5.0 g/dL   Globulin, Total 2.7 1.5 - 4.5 g/dL   Albumin/Globulin Ratio 1.8 1.2 - 2.2   Bilirubin Total 0.3 0.0 - 1.2 mg/dL   Alkaline Phosphatase 104 44 - 121 IU/L   AST 53 (H) 0 - 40 IU/L   ALT 125 (H) 0 - 44 IU/L  TSH  Result Value Ref Range   TSH 2.680 0.450 - 4.500 uIU/mL  Bayer DCA Hb A1c Waived  Result Value Ref Range   HB A1C (BAYER DCA - WAIVED) 5.4 4.8 - 5.6 %      Assessment & Plan:   Problem List Items Addressed This Visit       Other   Acute right-sided low back pain without sciatica - Primary    Acute, ongoing. Torodol 30 mg given in office today. Naproxen 500 mg ordered q12 PRN along with Zanaflex 2  mg PRN nightly for over a week. Provided back exercises and recommend continued stretching to prevent stiffness. F/u as needed if symptoms worsen for fail to improve. If no improvement in over a week will  consider referral to physical therapy. Provided work note.       Relevant Medications   naproxen (NAPROSYN) 500 MG tablet   tizanidine (ZANAFLEX) 2 MG capsule     Follow up plan: Return in about 1 week (around 12/12/2022), or if symptoms worsen or fail to improve, for Follow up mood.

## 2022-12-05 NOTE — Patient Instructions (Addendum)
Practice back exercises to prevent stiffness Take Naproxyn as needed for the next 3 days every 12 hours Take muscle relaxer ( Zanaflex ) nightly as needed for pain

## 2022-12-22 ENCOUNTER — Ambulatory Visit: Payer: PRIVATE HEALTH INSURANCE | Admitting: Nurse Practitioner

## 2022-12-29 ENCOUNTER — Other Ambulatory Visit: Payer: Self-pay | Admitting: Family Medicine

## 2022-12-29 DIAGNOSIS — M545 Low back pain, unspecified: Secondary | ICD-10-CM

## 2022-12-29 NOTE — Telephone Encounter (Signed)
Requested medication (s) are due for refill today: routing for review  Requested medication (s) are on the active medication list: yes  Last refill:  12/05/22  Future visit scheduled: yes  Notes to clinic:  Unable to refill per protocol due to failed labs, no updated results. Routing for review.      Requested Prescriptions  Pending Prescriptions Disp Refills   naproxen (NAPROSYN) 500 MG tablet [Pharmacy Med Name: NAPROXEN 500MG  TABLETS] 30 tablet 1    Sig: TAKE 1 TABLET(500 MG) BY MOUTH EVERY 12 HOURS AS NEEDED     Analgesics:  NSAIDS Failed - 12/29/2022  3:45 AM      Failed - Manual Review: Labs are only required if the patient has taken medication for more than 8 weeks.      Failed - Cr in normal range and within 360 days    Creatinine, Ser  Date Value Ref Range Status  05/31/2021 0.97 0.76 - 1.27 mg/dL Final         Failed - HGB in normal range and within 360 days    Hemoglobin  Date Value Ref Range Status  05/31/2021 16.1 13.0 - 17.7 g/dL Final         Failed - PLT in normal range and within 360 days    Platelets  Date Value Ref Range Status  05/31/2021 196 150 - 450 x10E3/uL Final         Failed - HCT in normal range and within 360 days    Hematocrit  Date Value Ref Range Status  05/31/2021 46.2 37.5 - 51.0 % Final         Failed - eGFR is 30 or above and within 360 days    GFR calc Af Amer  Date Value Ref Range Status  01/23/2020 103 >59 mL/min/1.73 Final    Comment:    **[facility] currently reports eGFR in compliance with the current**   recommendations of the [employer]. [facility] will   update reporting as new guidelines are published from the NKF-ASN   Task force.    GFR calc non Af Amer  Date Value Ref Range Status  01/23/2020 89 >59 mL/min/1.73 Final   eGFR  Date Value Ref Range Status  05/31/2021 105 >59 mL/min/1.73 Final         Passed - Patient is not pregnant      Passed - Valid encounter within last 12 months    Recent  Outpatient Visits           3 weeks ago Acute right-sided low back pain without sciatica   [place] [facility] [place], Sherran Needs, NP   3 months ago Depression, major, single episode, in partial remission (HCC)   [place] [facility] [place], [place] T, NP   11 months ago Depression, major, single episode, in partial remission (HCC)   [place] [facility] [place], Corrie Dandy T, NP   1 year ago Depression, major, single episode, in partial remission (HCC)   [place] [facility] [place], Corrie Dandy T, NP   1 year ago Abscess   [place] [facility] Gerre Scull, NP       Future Appointments             In 2 weeks [place], Dorie Rank, NP [facility]

## 2023-01-14 NOTE — Patient Instructions (Signed)
Acute Back Pain, Adult Acute back pain is sudden and usually short-lived. It is often caused by an injury to the muscles and tissues in the back. The injury may result from: A muscle, tendon, or ligament getting overstretched or torn. Ligaments are tissues that connect bones to each other. Lifting something improperly can cause a back strain. Wear and tear (degeneration) of the spinal disks. Spinal disks are circular tissue that provide cushioning between the bones of the spine (vertebrae). Twisting motions, such as while playing sports or doing yard work. A hit to the back. Arthritis. You may have a physical exam, lab tests, and imaging tests to find the cause of your pain. Acute back pain usually goes away with rest and home care. Follow these instructions at home: Managing pain, stiffness, and swelling Take over-the-counter and prescription medicines only as told by your health care provider. Treatment may include medicines for pain and inflammation that are taken by mouth or applied to the skin, or muscle relaxants. Your health care provider may recommend applying ice during the first 24-48 hours after your pain starts. To do this: Put ice in a plastic bag. Place a towel between your skin and the bag. Leave the ice on for 20 minutes, 2-3 times a day. Remove the ice if your skin turns bright red. This is very important. If you cannot feel pain, heat, or cold, you have a greater risk of damage to the area. If directed, apply heat to the affected area as often as told by your health care provider. Use the heat source that your health care provider recommends, such as a moist heat pack or a heating pad. Place a towel between your skin and the heat source. Leave the heat on for 20-30 minutes. Remove the heat if your skin turns bright red. This is especially important if you are unable to feel pain, heat, or cold. You have a greater risk of getting burned. Activity  Do not stay in bed. Staying in  bed for more than 1-2 days can delay your recovery. Sit up and stand up straight. Avoid leaning forward when you sit or hunching over when you stand. If you work at a desk, sit close to it so you do not need to lean over. Keep your chin tucked in. Keep your neck drawn back, and keep your elbows bent at a 90-degree angle (right angle). Sit high and close to the steering wheel when you drive. Add lower back (lumbar) support to your car seat, if needed. Take short walks on even surfaces as soon as you are able. Try to increase the length of time you walk each day. Do not sit, drive, or stand in one place for more than 30 minutes at a time. Sitting or standing for long periods of time can put stress on your back. Do not drive or use heavy machinery while taking prescription pain medicine. Use proper lifting techniques. When you bend and lift, use positions that put less stress on your back: Bend your knees. Keep the load close to your body. Avoid twisting. Exercise regularly as told by your health care provider. Exercising helps your back heal faster and helps prevent back injuries by keeping muscles strong and flexible. Work with a physical therapist to make a safe exercise program, as recommended by your health care provider. Do any exercises as told by your physical therapist. Lifestyle Maintain a healthy weight. Extra weight puts stress on your back and makes it difficult to have good   posture. Avoid activities or situations that make you feel anxious or stressed. Stress and anxiety increase muscle tension and can make back pain worse. Learn ways to manage anxiety and stress, such as through exercise. General instructions Sleep on a firm mattress in a comfortable position. Try lying on your side with your knees slightly bent. If you lie on your back, put a pillow under your knees. Keep your head and neck in a straight line with your spine (neutral position) when using electronic equipment like  smartphones or pads. To do this: Raise your smartphone or pad to look at it instead of bending your head or neck to look down. Put the smartphone or pad at the level of your face while looking at the screen. Follow your treatment plan as told by your health care provider. This may include: Cognitive or behavioral therapy. Acupuncture or massage therapy. Meditation or yoga. Contact a health care provider if: You have pain that is not relieved with rest or medicine. You have increasing pain going down into your legs or buttocks. Your pain does not improve after 2 weeks. You have pain at night. You lose weight without trying. You have a fever or chills. You develop nausea or vomiting. You develop abdominal pain. Get help right away if: You develop new bowel or bladder control problems. You have unusual weakness or numbness in your arms or legs. You feel faint. These symptoms may represent a serious problem that is an emergency. Do not wait to see if the symptoms will go away. Get medical help right away. Call your local emergency services (911 in the [place] Do not drive yourself to the hospital. Summary Acute back pain is sudden and usually short-lived. Use proper lifting techniques. When you bend and lift, use positions that put less stress on your back. Take over-the-counter and prescription medicines only as told by your health care provider, and apply heat or ice as told. This information is not intended to replace advice given to you by your health care provider. Make sure you discuss any questions you have with your health care provider. Document Revised: 10/08/2020 Document Reviewed: 10/08/2020 Elsevier Patient Education  2024 [employer]  

## 2023-01-17 ENCOUNTER — Ambulatory Visit (INDEPENDENT_AMBULATORY_CARE_PROVIDER_SITE_OTHER): Payer: PRIVATE HEALTH INSURANCE | Admitting: Nurse Practitioner

## 2023-01-17 ENCOUNTER — Encounter: Payer: Self-pay | Admitting: Nurse Practitioner

## 2023-01-17 VITALS — BP 118/79 | HR 70 | Temp 98.2°F | Ht 70.0 in | Wt 222.2 lb

## 2023-01-17 DIAGNOSIS — F419 Anxiety disorder, unspecified: Secondary | ICD-10-CM

## 2023-01-17 DIAGNOSIS — F324 Major depressive disorder, single episode, in partial remission: Secondary | ICD-10-CM | POA: Diagnosis not present

## 2023-01-17 MED ORDER — CITALOPRAM HYDROBROMIDE 20 MG PO TABS: 20.0000 mg | ORAL_TABLET | Freq: Every day | ORAL | 12 refills | Status: DC

## 2023-01-17 NOTE — Progress Notes (Signed)
BP 118/79   Pulse 70   Temp 98.2 F (36.8 C) (Oral)   Ht 5\' 10"  (1.778 m)   Wt 222 lb 3.2 oz (100.8 kg)   SpO2 98%   BMI 31.88 kg/m    Subjective:    Patient ID: David Wong, male    DOB: 06-26-1986, 37 y.o.   MRN: [id number]  HPI: David Wong is a 37 y.o. male  Chief Complaint  Patient presents with   Mood   Medication Refill   DEPRESSION & ANXIETY Currently taking Celexa 20 MG daily and Buspar 5 MG PRN.  Mood status: stable Satisfied with current treatment?: yes Symptom severity: moderate  Duration of current treatment : chronic Side effects: no Medication compliance: good compliance Psychotherapy/counseling: none Depressed mood: no Anxious mood: no Anhedonia: no Significant weight loss or gain: no Insomnia: none Fatigue: no Feelings of worthlessness or guilt: no Impaired concentration/indecisiveness: no Suicidal ideations: no Hopelessness: no Crying spells: no    01/17/2023    2:49 PM 12/05/2022   10:24 AM 09/01/2022   10:01 AM 01/30/2022    8:49 AM 08/02/2021    3:55 PM  Depression screen PHQ 2/9  Decreased Interest 0 0 0 0 0  Down, Depressed, Hopeless 0 0 0 0 1  PHQ - 2 Score 0 0 0 0 1  Altered sleeping 0 1 1 1 1   Tired, decreased energy 1 0 0 3 1  Change in appetite 0 0 0 0 0  Feeling bad or failure about yourself  0 0 0 1 0  Trouble concentrating 0 0 1 0 0  Moving slowly or fidgety/restless 0 0 0 0 0  Suicidal thoughts 0 0 0 0 0  PHQ-9 Score 1 1 2 5 3   Difficult doing work/chores Not difficult at all Not difficult at all Not difficult at all Not difficult at all Not difficult at all       01/17/2023    2:49 PM 12/05/2022   11:00 AM 09/01/2022   10:02 AM 01/30/2022    8:50 AM  GAD 7 : Generalized Anxiety Score  Nervous, Anxious, on Edge 0 0 2 0  Control/stop worrying 1 0 1 1  Worry too much - different things 0 1 1 2   Trouble relaxing 0 0 0 0  Restless 0 0 0 0  Easily annoyed or irritable 0 1 0 0  Afraid - awful might happen 0 0 0 0  Total  GAD 7 Score 1 2 4 3   Anxiety Difficulty Not difficult at all Not difficult at all Not difficult at all Not difficult at all    Relevant past medical, surgical, family and social history reviewed and updated as indicated. Interim medical history since our last visit reviewed. Allergies and medications reviewed and updated.  Review of Systems  Constitutional:  Negative for activity change, diaphoresis, fatigue and fever.  Respiratory:  Negative for cough, chest tightness, shortness of breath and wheezing.   Cardiovascular:  Negative for chest pain, palpitations and leg swelling.  Gastrointestinal: Negative.   Neurological: Negative.   Psychiatric/Behavioral: Negative.      Per HPI unless specifically indicated above     Objective:    BP 118/79   Pulse 70   Temp 98.2 F (36.8 C) (Oral)   Ht 5\' 10"  (1.778 m)   Wt 222 lb 3.2 oz (100.8 kg)   SpO2 98%   BMI 31.88 kg/m   Wt Readings from Last 3 Encounters:  01/17/23 222  lb 3.2 oz (100.8 kg)  12/05/22 221 lb 9.6 oz (100.5 kg)  04/27/22 218 lb (98.9 kg)    Physical Exam Vitals and nursing note reviewed.  Constitutional:      General: He is awake. He is not in acute distress.    Appearance: He is well-developed, well-groomed and overweight. He is not ill-appearing.  HENT:     Head: Normocephalic and atraumatic.     Right Ear: Hearing normal. No drainage.     Left Ear: Hearing normal. No drainage.  Eyes:     General: Lids are normal.        Right eye: No discharge.        Left eye: No discharge.     Conjunctiva/sclera: Conjunctivae normal.     Pupils: Pupils are equal, round, and reactive to light.  Neck:     Thyroid: No thyromegaly.     Vascular: No carotid bruit.  Cardiovascular:     Rate and Rhythm: Normal rate and regular rhythm.     Heart sounds: Normal heart sounds, S1 normal and S2 normal. No murmur heard.    No gallop.  Pulmonary:     Effort: Pulmonary effort is normal. No accessory muscle usage or respiratory  distress.     Breath sounds: Normal breath sounds.  Abdominal:     General: Bowel sounds are normal.     Palpations: Abdomen is soft.     Tenderness: There is no abdominal tenderness.  Musculoskeletal:        General: Normal range of motion.     Cervical back: Normal range of motion and neck supple.     Right lower leg: No edema.     Left lower leg: No edema.  Skin:    General: Skin is warm and dry.  Neurological:     Mental Status: He is alert and oriented to person, place, and time.  Psychiatric:        Attention and Perception: Attention normal.        Mood and Affect: Mood normal.        Speech: Speech normal.        Behavior: Behavior normal. Behavior is cooperative.        Thought Content: Thought content normal.     Results for orders placed or performed in visit on 05/31/21  CBC with Differential/Platelet  Result Value Ref Range   WBC 7.8 3.4 - 10.8 x10E3/uL   RBC 4.95 4.14 - 5.80 x10E6/uL   Hemoglobin 16.1 13.0 - 17.7 g/dL   Hematocrit 19.1 47.8 - 51.0 %   MCV 93 79 - 97 fL   MCH 32.5 26.6 - 33.0 pg   MCHC 34.8 31.5 - 35.7 g/dL   RDW 29.5 62.1 - 30.8 %   Platelets 196 150 - 450 x10E3/uL   Neutrophils 51 Not Estab. %   Lymphs 33 Not Estab. %   Monocytes 10 Not Estab. %   Eos 5 Not Estab. %   Basos 1 Not Estab. %   Neutrophils Absolute 4.0 1.4 - 7.0 x10E3/uL   Lymphocytes Absolute 2.5 0.7 - 3.1 x10E3/uL   Monocytes Absolute 0.8 0.1 - 0.9 x10E3/uL   EOS (ABSOLUTE) 0.4 0.0 - 0.4 x10E3/uL   Basophils Absolute 0.1 0.0 - 0.2 x10E3/uL   Immature Granulocytes 0 Not Estab. %   Immature Grans (Abs) 0.0 0.0 - 0.1 x10E3/uL  Comprehensive metabolic panel  Result Value Ref Range   Glucose 76 70 - 99 mg/dL   BUN  14 6 - 20 mg/dL   Creatinine, Ser 1.47 0.76 - 1.27 mg/dL   eGFR 829 >56 OZ/HYQ/6.57   BUN/Creatinine Ratio 14 9 - 20   Sodium 138 134 - 144 mmol/L   Potassium 4.4 3.5 - 5.2 mmol/L   Chloride 106 96 - 106 mmol/L   CO2 19 (L) 20 - 29 mmol/L   Calcium 10.0 8.7  - 10.2 mg/dL   Total Protein 7.5 6.0 - 8.5 g/dL   Albumin 4.8 4.0 - 5.0 g/dL   Globulin, Total 2.7 1.5 - 4.5 g/dL   Albumin/Globulin Ratio 1.8 1.2 - 2.2   Bilirubin Total 0.3 0.0 - 1.2 mg/dL   Alkaline Phosphatase 104 44 - 121 IU/L   AST 53 (H) 0 - 40 IU/L   ALT 125 (H) 0 - 44 IU/L  TSH  Result Value Ref Range   TSH 2.680 0.450 - 4.500 uIU/mL  Bayer DCA Hb A1c Waived  Result Value Ref Range   HB A1C (BAYER DCA - WAIVED) 5.4 4.8 - 5.6 %      Assessment & Plan:   Problem List Items Addressed This Visit       Other   Anxiety    Refer to depression plan of care.      Relevant Medications   citalopram (CELEXA) 20 MG tablet   Depression, major, single episode, in partial remission (HCC) - Primary    Chronic, stable. Denies SI/HI.  Continue Celexa 20 MG daily + Buspar 5 MG BID PRN for increased anxiety episodes.  Educated him on this regimen.  Discussed deep breathing exercises.  Return in 6 months.      Relevant Medications   citalopram (CELEXA) 20 MG tablet     Follow up plan: Return in about 6 months (around 07/19/2023) for Annual physical.

## 2023-01-17 NOTE — Assessment & Plan Note (Signed)
Refer to depression plan of care. 

## 2023-01-17 NOTE — Assessment & Plan Note (Signed)
Chronic, stable. Denies SI/HI.  Continue Celexa 20 MG daily + Buspar 5 MG BID PRN for increased anxiety episodes.  Educated him on this regimen.  Discussed deep breathing exercises.  Return in 6 months.

## 2023-05-22 ENCOUNTER — Encounter: Payer: Self-pay | Admitting: Oncology

## 2023-05-22 ENCOUNTER — Ambulatory Visit
Admission: EM | Admit: 2023-05-22 | Discharge: 2023-05-22 | Payer: PRIVATE HEALTH INSURANCE | Attending: Emergency Medicine | Admitting: Emergency Medicine

## 2023-05-22 DIAGNOSIS — H5711 Ocular pain, right eye: Secondary | ICD-10-CM | POA: Diagnosis not present

## 2023-05-22 DIAGNOSIS — R110 Nausea: Secondary | ICD-10-CM

## 2023-05-22 DIAGNOSIS — R519 Headache, unspecified: Secondary | ICD-10-CM | POA: Diagnosis not present

## 2023-05-22 DIAGNOSIS — S0993XA Unspecified injury of face, initial encounter: Secondary | ICD-10-CM | POA: Diagnosis not present
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
## 2023-05-22 NOTE — ED Notes (Addendum)
Called pt's insurance [employer] in order to initiate PA for CT maxillofacial w/o contrast. Called insurance x4 unable to reach x4. Informed pt & provider that I was unable to reach insurance company so we could not proceed w/CT due to not obtaining PA. Per Nutritional therapist if insurance cannot be reached & unable obtain PA CT needs to be cancelled.
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
## 2023-05-25 ENCOUNTER — Ambulatory Visit (INDEPENDENT_AMBULATORY_CARE_PROVIDER_SITE_OTHER): Payer: PRIVATE HEALTH INSURANCE | Admitting: Nurse Practitioner

## 2023-05-25 ENCOUNTER — Telehealth: Payer: Self-pay | Admitting: Nurse Practitioner

## 2023-05-25 ENCOUNTER — Encounter: Payer: Self-pay | Admitting: Nurse Practitioner

## 2023-05-25 ENCOUNTER — Ambulatory Visit
Admission: RE | Admit: 2023-05-25 | Discharge: 2023-05-25 | Disposition: A | Discharge status: Home / Self Care | Payer: 59 | Source: Ambulatory Visit | Attending: Nurse Practitioner | Admitting: Nurse Practitioner

## 2023-05-25 VITALS — BP 113/79 | HR 71 | Temp 98.1°F | Ht 70.5 in | Wt 224.2 lb

## 2023-05-25 DIAGNOSIS — G44319 Acute post-traumatic headache, not intractable: Secondary | ICD-10-CM | POA: Insufficient documentation

## 2023-05-25 DIAGNOSIS — S0990XA Unspecified injury of head, initial encounter: Secondary | ICD-10-CM | POA: Insufficient documentation
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
## 2023-05-25 NOTE — Telephone Encounter (Signed)
Spoke to David Wong on the phone and alerted him to reassuring CT scan results.  Discussed with him suspect he has some post concussion syndrome and recommend to ensure to rest all weekend and hydrate well, if still not 100% Monday would hold off on returning to work.  He reports understanding.
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
## 2023-05-25 NOTE — Patient Instructions (Signed)
Post-Concussion Syndrome  A concussion is a brain injury from a direct hit to your head or body. This hit makes your brain shake fast in your skull. Normally, brain injuries are not life-threatening. Post-concussion syndrome is when symptoms last longer than normal after a head injury. What are the causes? The cause of this condition is not known. It can happen if your head injury was mild or very bad. What increases the risk? Being male. Being young. Having had a head injury before. Having had headaches a lot before. Being sad (depressed) or feeling worried or nervous (anxious). Having more than one symptom or very bad symptoms when you got injured. Fainting when you got your concussion or not being able to remember it. What are the signs or symptoms? After a head injury, you may have physical symptoms, such as: Headaches. Feeling tired, dizzy, or weak. Trouble seeing. Having eye trouble in bright lights. Trouble hearing. Problems with balance. You may also have symptoms that affect your thinking or your feelings, such as: Not being able to remember things. Not being able to focus. Trouble falling asleep or staying asleep (insomnia). Feeling grouchy (irritable). Feeling worried or sad. Trouble learning new things. Symptoms can last weeks or months. Sometimes, symptoms are serious. How is this treated? Treatment may depend on your symptoms. These normally go away on their own with time. If you need treatment, it may include: Medicines. Resting your brain and body for a few days. Doing therapy to help you heal (rehab therapy). This may include: Physical or occupational therapy. This may include exercises. Talking to a counselor. Speech therapy. Therapy to help your eyes. Follow these instructions at home: Medicines Take over-the-counter and prescription medicines only as told by your doctor. Avoid pain medicines that have opioids in them. Activity Limit activities as  told by your doctor. This may include not doing these things: Homework. Work for your job. Hard thinking. Watching TV. Using a computer or phone. Puzzles and games for your brain. Exercise and sports. Slowly return to your normal activities as told by your doctor. Slow down or stop an activity if you get symptoms. Rest. Try to sleep 7-9 hours each night. Also, try taking naps or breaks when you feel tired during the day. Do not do anything that could cause you to get hurt again right away. Getting hurt again can harm your brain. General instructions  Do not drink alcohol until your doctor says that you can. Keep track of your symptoms, and tell your doctor about them. Keep all follow-up visits. Your doctors may need to check you for new or serious symptoms. Where to find more information [employer]: concussionfoundation.org Contact a doctor if: You do not improve. You get injured again. You have changes in how you act. Get help right away if: You have a very bad headache or a headache that gets worse. You can't stop vomiting. Any part of your body feels weak or loses feeling. You feel mixed up (confused). You have trouble talking. You feel very sleepy. You faint or you have a seizure. These symptoms may be an emergency. Get help right away. Call 911. Do not wait to see if the symptoms will go away. Do not drive yourself to the hospital. Also, get help right away if: You think about hurting yourself or others. Take one of these steps if you feel like you may hurt yourself or others, or have thoughts about taking your own life: Go to your nearest emergency room.  Call 911. Call the National Suicide Prevention Lifeline at 604-286-5422 or 988. This is open 24 hours a day. Text the Crisis Text Line at 650-656-5841. This information is not intended to replace advice given to you by your health care provider. Make sure you discuss any questions you have with your health  care provider. Document Revised: 12/09/2021 Document Reviewed: 12/09/2021 Elsevier Patient Education  2024 [employer].

## 2023-05-25 NOTE — Assessment & Plan Note (Signed)
Refer to acute post-traumatic headache plan of care.

## 2023-05-30 ENCOUNTER — Encounter: Payer: Self-pay | Admitting: Oncology

## 2023-06-04 ENCOUNTER — Ambulatory Visit: Payer: PRIVATE HEALTH INSURANCE | Admitting: Nurse Practitioner

## 2023-06-21 ENCOUNTER — Encounter: Payer: Self-pay | Admitting: Oncology

## 2023-06-26 ENCOUNTER — Encounter: Payer: Self-pay | Admitting: Oncology

## 2023-07-20 ENCOUNTER — Ambulatory Visit (INDEPENDENT_AMBULATORY_CARE_PROVIDER_SITE_OTHER): Payer: PRIVATE HEALTH INSURANCE | Admitting: Nurse Practitioner

## 2023-07-20 ENCOUNTER — Encounter: Payer: Self-pay | Admitting: Nurse Practitioner

## 2023-07-20 VITALS — BP 107/70 | HR 70 | Temp 98.0°F | Ht 70.5 in | Wt 227.0 lb

## 2023-07-20 DIAGNOSIS — F324 Major depressive disorder, single episode, in partial remission: Secondary | ICD-10-CM | POA: Diagnosis not present

## 2023-07-20 DIAGNOSIS — E6609 Other obesity due to excess calories: Secondary | ICD-10-CM

## 2023-07-20 DIAGNOSIS — Z1159 Encounter for screening for other viral diseases: Secondary | ICD-10-CM | POA: Diagnosis not present

## 2023-07-20 DIAGNOSIS — R7989 Other specified abnormal findings of blood chemistry: Secondary | ICD-10-CM

## 2023-07-20 DIAGNOSIS — Z0000 Encounter for general adult medical examination without abnormal findings: Secondary | ICD-10-CM

## 2023-07-20 DIAGNOSIS — E7800 Pure hypercholesterolemia, unspecified: Secondary | ICD-10-CM

## 2023-07-20 DIAGNOSIS — Z833 Family history of diabetes mellitus: Secondary | ICD-10-CM

## 2023-07-20 DIAGNOSIS — F419 Anxiety disorder, unspecified: Secondary | ICD-10-CM | POA: Diagnosis not present

## 2023-07-20 DIAGNOSIS — E66811 Obesity, class 1: Secondary | ICD-10-CM

## 2023-07-20 DIAGNOSIS — Z6832 Body mass index (BMI) 32.0-32.9, adult: Secondary | ICD-10-CM

## 2023-07-20 DIAGNOSIS — G4733 Obstructive sleep apnea (adult) (pediatric): Secondary | ICD-10-CM

## 2023-07-20 DIAGNOSIS — D500 Iron deficiency anemia secondary to blood loss (chronic): Secondary | ICD-10-CM | POA: Diagnosis not present

## 2023-07-20 NOTE — Assessment & Plan Note (Signed)
Noted on past labs, will recheck today and recommend continue heavy focus on regular exercise and healthy diet.

## 2023-07-20 NOTE — Assessment & Plan Note (Signed)
BMI 32.11.  Recommended eating smaller high protein, low fat meals more frequently and exercising 30 mins a day 5 times a week with a goal of 10-15lb weight loss in the next 3 months. Patient voiced their understanding and motivation to adhere to these recommendations.

## 2023-07-20 NOTE — Progress Notes (Signed)
BP 107/70   Pulse 70   Temp 98 F (36.7 C) (Oral)   Ht 5' 10.5" (1.791 m)   Wt 227 lb (103 kg)   SpO2 98%   BMI 32.11 kg/m    Subjective:    Patient ID: Minus David Wong, male    DOB: [birth year], 37 y.o.   MRN: [id number]  HPI: David Wong is a 37 y.o. male presenting on 07/20/2023 for comprehensive medical examination. Current medical complaints include:none  He currently lives with: wife Interim Problems from his last visit: no  ANXIETY/DEPRESSION Continues on Celexa daily with benefit. Mood status: stable Satisfied with current treatment?: yes Symptom severity: mild  Duration of current treatment : chronic Side effects: no Medication compliance: good compliance Psychotherapy/counseling: in past Depressed mood: no Anxious mood: no Anhedonia: no Significant weight loss or gain: no Insomnia:none Fatigue: no Feelings of worthlessness or guilt: no Impaired concentration/indecisiveness: no Suicidal ideations: no Hopelessness: no Crying spells: no    07/20/2023    1:09 PM 01/17/2023    2:49 PM 12/05/2022   10:24 AM 09/01/2022   10:01 AM 01/30/2022    8:49 AM  Depression screen PHQ 2/9  Decreased Interest 0 0 0 0 0  Down, Depressed, Hopeless 1 0 0 0 0  PHQ - 2 Score 1 0 0 0 0  Altered sleeping 1 0 1 1 1   Tired, decreased energy 0 1 0 0 3  Change in appetite 0 0 0 0 0  Feeling bad or failure about yourself  1 0 0 0 1  Trouble concentrating 0 0 0 1 0  Moving slowly or fidgety/restless 0 0 0 0 0  Suicidal thoughts 0 0 0 0 0  PHQ-9 Score 3 1 1 2 5   Difficult doing work/chores Not difficult at all Not difficult at all Not difficult at all Not difficult at all Not difficult at all      07/20/2023    1:09 PM 01/17/2023    2:49 PM 12/05/2022   11:00 AM 09/01/2022   10:02 AM  GAD 7 : Generalized Anxiety Score  Nervous, Anxious, on Edge 0 0 0 2  Control/stop worrying 0 1 0 1  Worry too much - different things 0 0 1 1  Trouble relaxing 0 0 0 0  Restless 0 0 0 0   Easily annoyed or irritable 1 0 1 0  Afraid - awful might happen 0 0 0 0  Total GAD 7 Score 1 1 2 4   Anxiety Difficulty Not difficult at all Not difficult at all Not difficult at all Not difficult at all        02/03/2020   10:35 AM 02/24/2020    1:50 PM 03/09/2022   10:58 AM 09/01/2022   10:00 AM 07/20/2023    1:09 PM  Fall Risk  Falls in the past year?   0 0 0  Was there an injury with Fall?    0 0  Fall Risk Category Calculator    0 0  (RETIRED) Patient Fall Risk Level Low fall risk Low fall risk     Patient at Risk for Falls Due to    No Fall Risks No Fall Risks  Fall risk Follow up    Falls evaluation completed Falls evaluation completed    Functional Status Survey: Is the patient deaf or have difficulty hearing?: No Does the patient have difficulty seeing, even when wearing glasses/contacts?: No Does the patient have difficulty concentrating, remembering, or making decisions?:  No Does the patient have difficulty walking or climbing stairs?: No Does the patient have difficulty dressing or bathing?: No Does the patient have difficulty doing errands alone such as visiting a doctor's office or shopping?: No   Past Medical History:  Past Medical History:  Diagnosis Date   Anemia    Anxiety    Depression    GERD (gastroesophageal reflux disease)    occ    IDA (iron deficiency anemia) 02/03/2020   Sleep apnea    does not have cpap    Surgical History:  Past Surgical History:  Procedure Laterality Date   COLONOSCOPY WITH PROPOFOL N/A 10/08/2018   Procedure: COLONOSCOPY WITH PROPOFOL;  Surgeon: Toney Reil, MD;  Location: [facility] ENDOSCOPY;  Service: Gastroenterology;  Laterality: N/A;   ESOPHAGOGASTRODUODENOSCOPY (EGD) WITH PROPOFOL N/A 10/08/2018   Procedure: ESOPHAGOGASTRODUODENOSCOPY (EGD) WITH PROPOFOL;  Surgeon: Toney Reil, MD;  Location: [facility] ENDOSCOPY;  Service: Gastroenterology;  Laterality: N/A;   FISTULOTOMY N/A 04/14/2022   Procedure: FISTULOTOMY,  intersphinctric;  Surgeon: Campbell Lerner, MD;  Location: [facility] ORS;  Service: General;  Laterality: N/A;   HEMORRHOID SURGERY N/A 11/17/2019   Procedure: HEMORRHOIDECTOMY Internal & External;  Surgeon: Campbell Lerner, MD;  Location: [facility] ORS;  Service: General;  Laterality: N/A;   RECTAL EXAM UNDER ANESTHESIA N/A 04/14/2022   Procedure: RECTAL EXAM UNDER ANESTHESIA;  Surgeon: Campbell Lerner, MD;  Location: [facility] ORS;  Service: General;  Laterality: N/A;   right pinky finger  2013   pins inserted    Medications:  Current Outpatient Medications on File Prior to Visit  Medication Sig   citalopram (CELEXA) 20 MG tablet Take 1 tablet (20 mg total) by mouth daily.   No current facility-administered medications on file prior to visit.    Allergies:  No Known Allergies  Social History:  Social History   Socioeconomic History   Marital status: Single    Spouse name: Not on file   Number of children: Not on file   Years of education: Not on file   Highest education level: Not on file  Occupational History   Not on file  Tobacco Use   Smoking status: Never   Smokeless tobacco: Never  Vaping Use   Vaping status: Every Day   Start date: 02/03/2016  Substance and Sexual Activity   Alcohol use: Yes    Alcohol/week: 0.0 standard drinks of alcohol    Comment: Rarely   Drug use: No   Sexual activity: Yes    Partners: Female    Comment: Fiance on birth control  Other Topics Concern   Not on file  Social History Narrative   Not on file   Social Drivers of Health   Financial Resource Strain: Low Risk  (07/20/2023)   Overall Financial Resource Strain (CARDIA)    Difficulty of Paying Living Expenses: Not hard at all  Food Insecurity: No Food Insecurity (07/20/2023)   Hunger Vital Sign    Worried About Running Out of Food in the Last Year: Never true    Ran Out of Food in the Last Year: Never true  Transportation Needs: No Transportation Needs (07/20/2023)   PRAPARE -  Administrator, Civil Service (Medical): No    Lack of Transportation (Non-Medical): No  Physical Activity: Inactive (07/20/2023)   Exercise Vital Sign    Days of Exercise per Week: 0 days    Minutes of Exercise per Session: 0 min  Stress: No Stress Concern Present (07/20/2023)   [employer]  of Occupational Health - Occupational Stress Questionnaire    Feeling of Stress : Not at all  Social Connections: Moderately Isolated (07/20/2023)   Social Connection and Isolation Panel [NHANES]    Frequency of Communication with Friends and Family: Three times a week    Frequency of Social Gatherings with Friends and Family: Three times a week    Attends Religious Services: Never    Active Member of Clubs or Organizations: No    Attends Banker Meetings: Never    Marital Status: Married  Catering manager Violence: Not At Risk (07/20/2023)   Humiliation, Afraid, Rape, and Kick questionnaire    Fear of Current or Ex-Partner: No    Emotionally Abused: No    Physically Abused: No    Sexually Abused: No   Social History   Tobacco Use  Smoking Status Never  Smokeless Tobacco Never   Social History   Substance and Sexual Activity  Alcohol Use Yes   Alcohol/week: 0.0 standard drinks of alcohol   Comment: Rarely    Family History:  Family History  Problem Relation Age of Onset   Hyperlipidemia Mother    Hypertension Mother    Hyperlipidemia Father    Diabetes Father    Hypertension Brother    Diabetes Brother    Arthritis Maternal Grandfather        RA   Heart disease Paternal Grandfather    Breast cancer Maternal Aunt     Past medical history, surgical history, medications, allergies, family history and social history reviewed with patient today and changes made to appropriate areas of the chart.   ROS All other ROS negative except what is listed above and in the HPI.      Objective:    BP 107/70   Pulse 70   Temp 98 F (36.7 C) (Oral)   Ht  5' 10.5" (1.791 m)   Wt 227 lb (103 kg)   SpO2 98%   BMI 32.11 kg/m   Wt Readings from Last 3 Encounters:  07/20/23 227 lb (103 kg)  05/25/23 224 lb 3.2 oz (101.7 kg)  05/22/23 230 lb (104.3 kg)    Physical Exam Vitals and nursing note reviewed.  Constitutional:      General: He is awake. He is not in acute distress.    Appearance: He is well-developed and well-groomed. He is not ill-appearing or toxic-appearing.  HENT:     Head: Normocephalic and atraumatic.     Right Ear: Hearing, tympanic membrane, ear canal and external ear normal. No drainage.     Left Ear: Hearing, tympanic membrane, ear canal and external ear normal. No drainage.     Nose: Nose normal.     Mouth/Throat:     Pharynx: Uvula midline.  Eyes:     General: Lids are normal.        Right eye: No discharge.        Left eye: No discharge.     Extraocular Movements: Extraocular movements intact.     Conjunctiva/sclera: Conjunctivae normal.     Pupils: Pupils are equal, round, and reactive to light.     Visual Fields: Right eye visual fields normal and left eye visual fields normal.  Neck:     Thyroid: No thyromegaly.     Vascular: No carotid bruit or JVD.     Trachea: Trachea normal.  Cardiovascular:     Rate and Rhythm: Normal rate and regular rhythm.     Heart sounds: Normal heart sounds, S1  normal and S2 normal. No murmur heard.    No gallop.  Pulmonary:     Effort: Pulmonary effort is normal. No accessory muscle usage or respiratory distress.     Breath sounds: Normal breath sounds.  Abdominal:     General: Bowel sounds are normal.     Palpations: Abdomen is soft. There is no hepatomegaly or splenomegaly.     Tenderness: There is no abdominal tenderness.  Musculoskeletal:        General: Normal range of motion.     Cervical back: Normal range of motion and neck supple.     Right lower leg: No edema.     Left lower leg: No edema.  Lymphadenopathy:     Head:     Right side of head: No submental,  submandibular, tonsillar, preauricular or posterior auricular adenopathy.     Left side of head: No submental, submandibular, tonsillar, preauricular or posterior auricular adenopathy.     Cervical: No cervical adenopathy.  Skin:    General: Skin is warm and dry.     Capillary Refill: Capillary refill takes less than 2 seconds.     Findings: No rash.  Neurological:     Mental Status: He is alert and oriented to person, place, and time.     Gait: Gait is intact.     Deep Tendon Reflexes: Reflexes are normal and symmetric.     Reflex Scores:      Brachioradialis reflexes are 2+ on the right side and 2+ on the left side.      Patellar reflexes are 2+ on the right side and 2+ on the left side. Psychiatric:        Attention and Perception: Attention normal.        Mood and Affect: Mood normal.        Speech: Speech normal.        Behavior: Behavior normal. Behavior is cooperative.        Thought Content: Thought content normal.        Cognition and Memory: Cognition normal.      Results for orders placed or performed in visit on 05/31/21  Bayer DCA Hb A1c Waived   Collection Time: 05/31/21 10:57 AM  Result Value Ref Range   HB A1C (BAYER DCA - WAIVED) 5.4 4.8 - 5.6 %  CBC with Differential/Platelet   Collection Time: 05/31/21 11:01 AM  Result Value Ref Range   WBC 7.8 3.4 - 10.8 x10E3/uL   RBC 4.95 4.14 - 5.80 x10E6/uL   Hemoglobin 16.1 13.0 - 17.7 g/dL   Hematocrit 52.8 41.3 - 51.0 %   MCV 93 79 - 97 fL   MCH 32.5 26.6 - 33.0 pg   MCHC 34.8 31.5 - 35.7 g/dL   RDW 24.4 01.0 - 27.2 %   Platelets 196 150 - 450 x10E3/uL   Neutrophils 51 Not Estab. %   Lymphs 33 Not Estab. %   Monocytes 10 Not Estab. %   Eos 5 Not Estab. %   Basos 1 Not Estab. %   Neutrophils Absolute 4.0 1.4 - 7.0 x10E3/uL   Lymphocytes Absolute 2.5 0.7 - 3.1 x10E3/uL   Monocytes Absolute 0.8 0.1 - 0.9 x10E3/uL   EOS (ABSOLUTE) 0.4 0.0 - 0.4 x10E3/uL   Basophils Absolute 0.1 0.0 - 0.2 x10E3/uL   Immature  Granulocytes 0 Not Estab. %   Immature Grans (Abs) 0.0 0.0 - 0.1 x10E3/uL  Comprehensive metabolic panel   Collection Time: 05/31/21 11:01 AM  Result  Value Ref Range   Glucose 76 70 - 99 mg/dL   BUN 14 6 - 20 mg/dL   Creatinine, Ser 1.61 0.76 - 1.27 mg/dL   eGFR 096 >04 VW/UJW/1.19   BUN/Creatinine Ratio 14 9 - 20   Sodium 138 134 - 144 mmol/L   Potassium 4.4 3.5 - 5.2 mmol/L   Chloride 106 96 - 106 mmol/L   CO2 19 (L) 20 - 29 mmol/L   Calcium 10.0 8.7 - 10.2 mg/dL   Total Protein 7.5 6.0 - 8.5 g/dL   Albumin 4.8 4.0 - 5.0 g/dL   Globulin, Total 2.7 1.5 - 4.5 g/dL   Albumin/Globulin Ratio 1.8 1.2 - 2.2   Bilirubin Total 0.3 0.0 - 1.2 mg/dL   Alkaline Phosphatase 104 44 - 121 IU/L   AST 53 (H) 0 - 40 IU/L   ALT 125 (H) 0 - 44 IU/L  TSH   Collection Time: 05/31/21 11:01 AM  Result Value Ref Range   TSH 2.680 0.450 - 4.500 uIU/mL      Assessment & Plan:   Problem List Items Addressed This Visit       Other   Anxiety   Refer to depression plan of care.      Relevant Orders   TSH   Depression, major, single episode, in partial remission (HCC) - Primary   Chronic, stable. Denies SI/HI.  Continue Celexa 20 MG daily which offers him benefit.  Educated him on this regimen.  Discussed deep breathing exercises.  Refills not due until June 2025.      Relevant Orders   TSH   Elevated low density lipoprotein (LDL) cholesterol level   Noted on past labs, will recheck today and recommend continue heavy focus on regular exercise and healthy diet.      Relevant Orders   Comprehensive metabolic panel   Lipid Panel w/o Chol/HDL Ratio   Family history of diabetes mellitus   Father and brother with diabetes.  Will check A1c yearly.      Relevant Orders   HgB A1c   IDA (iron deficiency anemia)   History of in past due to hemorrhoids, will check levels today and if stable will remove this diagnosis.      Relevant Orders   CBC with Differential/Platelet   Ferritin   Iron    Obesity   BMI 32.11.  Recommended eating smaller high protein, low fat meals more frequently and exercising 30 mins a day 5 times a week with a goal of 10-15lb weight loss in the next 3 months. Patient voiced their understanding and motivation to adhere to these recommendations.       Other Visit Diagnoses       Elevated LFTs       Recheck on labs today.   Relevant Orders   Comprehensive metabolic panel     Need for hepatitis C screening test       Hep C screening today, educated patient.   Relevant Orders   Hepatitis C antibody     Encounter for annual physical exam       Annual physical today with labs and health maintenance reviewed, discussed with patient.       Discussed aspirin prophylaxis for myocardial infarction prevention and decision was it was not indicated  LABORATORY TESTING:  Health maintenance labs ordered today as discussed above.   IMMUNIZATIONS:   - Tdap: Tetanus vaccination status reviewed: last tetanus booster within 10 years. - Influenza: Refused - Pneumovax: Not applicable -  Prevnar: Not applicable - Zostavax vaccine: Not applicable  SCREENING: - Colonoscopy: Not applicable  Discussed with patient purpose of the colonoscopy is to detect colon cancer at curable precancerous or early stages   - AAA Screening: Not applicable  -Hearing Test: Not applicable  -Spirometry: Not applicable   PATIENT COUNSELING:    Sexuality: Discussed sexually transmitted diseases, partner selection, use of condoms, avoidance of unintended pregnancy  and contraceptive alternatives.   Advised to avoid cigarette smoking.  I discussed with the patient that most people either abstain from alcohol or drink within safe limits (<=14/week and <=4 drinks/occasion for males, <=7/weeks and <= 3 drinks/occasion for females) and that the risk for alcohol disorders and other health effects rises proportionally with the number of drinks per week and how often a drinker exceeds daily  limits.  Discussed cessation/primary prevention of drug use and availability of treatment for abuse.   Diet: Encouraged to adjust caloric intake to maintain  or achieve ideal body weight, to reduce intake of dietary saturated fat and total fat, to limit sodium intake by avoiding high sodium foods and not adding table salt, and to maintain adequate dietary potassium and calcium preferably from fresh fruits, vegetables, and low-fat dairy products.    Stressed the importance of regular exercise  Injury prevention: Discussed safety belts, safety helmets, smoke detector, smoking near bedding or upholstery.   Dental health: Discussed importance of regular tooth brushing, flossing, and dental visits.   Follow up plan: NEXT PREVENTATIVE PHYSICAL DUE IN 1 YEAR. Return for Annual Physical.

## 2023-07-20 NOTE — Assessment & Plan Note (Signed)
History of in past due to hemorrhoids, will check levels today and if stable will remove this diagnosis.

## 2023-07-20 NOTE — Assessment & Plan Note (Signed)
Father and brother with diabetes.  Will check A1c yearly.

## 2023-07-20 NOTE — Patient Instructions (Signed)
 Managing Anxiety, Adult  After being diagnosed with anxiety, you may be relieved to know why you have felt or behaved a certain way. You may also feel overwhelmed about the treatment ahead and what it will mean for your life. With care and support, you can manage your anxiety.  How to manage lifestyle changes  Understanding the difference between stress and anxiety  Although stress can play a role in anxiety, it is not the same as anxiety. Stress is your body's reaction to life changes and events, both good and bad. Stress is often caused by something external, such as a deadline, test, or competition. It normally goes away after the event has ended and will last just a few hours. But, stress can be ongoing and can lead to more than just stress.  Anxiety is caused by something internal, such as imagining a terrible outcome or worrying that something will go wrong that will greatly upset you. Anxiety often does not go away even after the event is over, and it can become a long-term (chronic) worry.  Lowering stress and anxiety    Talk with your health care provider or a counselor to learn more about lowering anxiety and stress. They may suggest tension-reduction techniques, such as:  Music. Spend time creating or listening to music that you enjoy and that inspires you.  Mindfulness-based meditation. Practice being aware of your normal breaths while not trying to control your breathing. It can be done while sitting or walking.  Centering prayer. Focus on a word, phrase, or sacred image that means something to you and brings you peace.  Deep breathing. Expand your stomach and inhale slowly through your nose. Hold your breath for 3-5 seconds. Then breathe out slowly, letting your stomach muscles relax.  Self-talk. Learn to notice and spot thought patterns that lead to anxiety reactions. Change those patterns to thoughts that feel peaceful.  Muscle relaxation. Take time to tense muscles and then relax them.  Choose a  tension-reduction technique that fits your lifestyle and personality. These techniques take time and practice. Set aside 5-15 minutes a day to do them. Specialized therapists can offer counseling and training in these techniques. The training to help with anxiety may be covered by some insurance plans.  Other things you can do to manage stress and anxiety include:  Keeping a stress diary. This can help you learn what triggers your reaction and then learn ways to manage your response.  Thinking about how you react to certain situations. You may not be able to control everything, but you can control your response.  Making time for activities that help you relax and not feeling guilty about spending your time in this way.  Doing visual imagery. This involves imagining or creating mental pictures to help you relax.  Practicing yoga. Through yoga poses, you can lower tension and relax.     Medicines  Medicines for anxiety include:  Antidepressant medicines. These are usually prescribed for long-term daily control.  Anti-anxiety medicines. These may be added in severe cases, especially when panic attacks occur.  When used together, medicines, psychotherapy, and tension-reduction techniques may be the most effective treatment.  Relationships  Relationships can play a big part in helping you recover. Spend more time connecting with trusted friends and family members. Think about going to couples counseling if you have a partner, taking family education classes, or going to family therapy. Therapy can help you and others better understand your anxiety.  How to recognize changes in  your anxiety  Everyone responds differently to treatment for anxiety. Recovery from anxiety happens when symptoms lessen and stop interfering with your daily life at home or work. This may mean that you will start to:  Have better concentration and focus. Worry will interfere less in your daily thinking.  Sleep better.  Be less irritable.  Have  more energy.  Have improved memory.  Try to recognize when your condition is getting worse. Contact your provider if your symptoms interfere with home or work and you feel like your condition is not improving.  Follow these instructions at home:  Activity  Exercise. Adults should:  Exercise for at least 150 minutes each week. The exercise should increase your heart rate and make you sweat (moderate-intensity exercise).  Do strengthening exercises at least twice a week.  Get the right amount and quality of sleep. Most adults need 7-9 hours of sleep each night.  Lifestyle    Eat a healthy diet that includes plenty of vegetables, fruits, whole grains, low-fat dairy products, and lean protein.  Do not eat a lot of foods that are high in fats, added sugars, or salt (sodium).  Make choices that simplify your life.  Do not use any products that contain nicotine or tobacco. These products include cigarettes, chewing tobacco, and vaping devices, such as e-cigarettes. If you need help quitting, ask your provider.  Avoid caffeine, alcohol, and certain over-the-counter cold medicines. These may make you feel worse. Ask your pharmacist which medicines to avoid.  General instructions  Take over-the-counter and prescription medicines only as told by your provider.  Keep all follow-up visits. This is to make sure you are managing your anxiety well or if you need more support.  Where to find support  You can get help and support from:  Self-help groups.  Online and [employer].  A trusted spiritual leader.  Couples counseling.  Family education classes.  Family therapy.  Where to find more information  You may find that joining a support group helps you deal with your anxiety. The following sources can help you find counselors or support groups near you:  Mental Health America: mentalhealthamerica.net  Anxiety and Depression Association of [place] (ADAA): adaa.org  [employer] on Mental Illness (NAMI):  nami.org  Contact a health care provider if:  You have a hard time staying focused or finishing tasks.  You spend many hours a day feeling worried about everyday life.  You are very tired because you cannot stop worrying.  You start to have headaches or often feel tense.  You have chronic nausea or diarrhea.  Get help right away if:  Your heart feels like it is racing.  You have shortness of breath.  You have thoughts of hurting yourself or others.  Get help right away if you feel like you may hurt yourself or others, or have thoughts about taking your own life. Go to your nearest emergency room or:  Call 911.  Call the National Suicide Prevention Lifeline at 417-380-0019 or 988. This is open 24 hours a day.  Text the Crisis Text Line at 4151481703.  This information is not intended to replace advice given to you by your health care provider. Make sure you discuss any questions you have with your health care provider.  Document Revised: 04/25/2022 Document Reviewed: 11/07/2020  Elsevier Patient Education  2024 [employer].

## 2023-07-20 NOTE — Assessment & Plan Note (Signed)
Chronic, stable. Denies SI/HI.  Continue Celexa 20 MG daily which offers him benefit.  Educated him on this regimen.  Discussed deep breathing exercises.  Refills not due until June 2025.

## 2023-07-20 NOTE — Assessment & Plan Note (Signed)
Refer to depression plan of care. 

## 2023-07-21 ENCOUNTER — Other Ambulatory Visit: Payer: Self-pay | Admitting: Nurse Practitioner

## 2023-07-21 DIAGNOSIS — R7989 Other specified abnormal findings of blood chemistry: Secondary | ICD-10-CM | POA: Insufficient documentation

## 2023-07-21 LAB — LIPID PANEL W/O CHOL/HDL RATIO
Cholesterol, Total: 236 mg/dL — ABNORMAL HIGH (ref 100–199)
HDL: 46 mg/dL (ref >39)
LDL Chol Calc (NIH): 145 mg/dL — ABNORMAL HIGH (ref 0–99)
Triglycerides: 250 mg/dL — ABNORMAL HIGH (ref 0–149)
VLDL Cholesterol Cal: 45 mg/dL — ABNORMAL HIGH (ref 5–40)

## 2023-07-21 LAB — CBC WITH DIFFERENTIAL/PLATELET
Basophils Absolute: 0 10*3/uL (ref 0.0–0.2)
Basos: 1 %
EOS (ABSOLUTE): 0.3 10*3/uL (ref 0.0–0.4)
Eos: 5 %
Hematocrit: 48.5 % (ref 37.5–51.0)
Hemoglobin: 16.1 g/dL (ref 13.0–17.7)
Immature Grans (Abs): 0 10*3/uL (ref 0.0–0.1)
Immature Granulocytes: 0 %
Lymphocytes Absolute: 2.8 10*3/uL (ref 0.7–3.1)
Lymphs: 39 %
MCH: 31.8 pg (ref 26.6–33.0)
MCHC: 33.2 g/dL (ref 31.5–35.7)
MCV: 96 fL (ref 79–97)
Monocytes Absolute: 0.6 10*3/uL (ref 0.1–0.9)
Monocytes: 8 %
Neutrophils Absolute: 3.3 10*3/uL (ref 1.4–7.0)
Neutrophils: 47 %
Platelets: 205 10*3/uL (ref 150–450)
RBC: 5.07 x10E6/uL (ref 4.14–5.80)
RDW: 11.9 % (ref 11.6–15.4)
WBC: 7 10*3/uL (ref 3.4–10.8)

## 2023-07-21 LAB — COMPREHENSIVE METABOLIC PANEL
ALT: 108 [IU]/L — ABNORMAL HIGH (ref 0–44)
AST: 44 [IU]/L — ABNORMAL HIGH (ref 0–40)
Albumin: 4.4 g/dL (ref 4.1–5.1)
Alkaline Phosphatase: 99 [IU]/L (ref 44–121)
BUN/Creatinine Ratio: 12 (ref 9–20)
BUN: 12 mg/dL (ref 6–20)
Bilirubin Total: 0.5 mg/dL (ref 0.0–1.2)
CO2: 20 mmol/L (ref 20–29)
Calcium: 9.6 mg/dL (ref 8.7–10.2)
Chloride: 105 mmol/L (ref 96–106)
Creatinine, Ser: 1.03 mg/dL (ref 0.76–1.27)
Globulin, Total: 2.7 g/dL (ref 1.5–4.5)
Glucose: 88 mg/dL (ref 70–99)
Potassium: 4.3 mmol/L (ref 3.5–5.2)
Sodium: 140 mmol/L (ref 134–144)
Total Protein: 7.1 g/dL (ref 6.0–8.5)
eGFR: 97 mL/min/{1.73_m2} (ref >59)

## 2023-07-21 LAB — TSH: TSH: 0.685 u[IU]/mL (ref 0.450–4.500)

## 2023-07-21 LAB — HEPATITIS C ANTIBODY: Hep C Virus Ab: NONREACTIVE

## 2023-07-21 LAB — HEMOGLOBIN A1C
Est. average glucose Bld gHb Est-mCnc: 114 mg/dL
Hgb A1c MFr Bld: 5.6 % (ref 4.8–5.6)

## 2023-07-21 LAB — FERRITIN: Ferritin: 240 ng/mL (ref 30–400)

## 2023-07-21 LAB — IRON: Iron: 100 ug/dL (ref 38–169)

## 2023-07-21 NOTE — Progress Notes (Signed)
Contacted via MyChart - needs lab only visit in 8 weeks please    Good morning Lavarr, your labs have returned: - Kidney function, creatinine and eGFR, remains normal. Liver function, AST and ALT, are elevated still but trending down. I do recommend you cut back on any Tylenol or alcohol use if taking.  Would like to recheck in 8 weeks via outpatient labs. My staff will call to schedule this. - Lipid panel does show elevations still, ensure heavy focus on healthy diet changes and regular exercise. - Remainder of labs stable. Any questions? Keep being amazing!!  Thank you for allowing me to participate in your care.  I appreciate you. Kindest regards, Cael Worth

## 2023-09-14 ENCOUNTER — Other Ambulatory Visit: Payer: Self-pay

## 2023-09-20 ENCOUNTER — Other Ambulatory Visit: Payer: 59

## 2023-09-25 ENCOUNTER — Other Ambulatory Visit: Payer: 59

## 2023-10-10 ENCOUNTER — Ambulatory Visit: Admitting: Nurse Practitioner

## 2023-10-15 ENCOUNTER — Ambulatory Visit: Admitting: Nurse Practitioner

## 2023-10-20 ENCOUNTER — Ambulatory Visit
Admission: EM | Admit: 2023-10-20 | Discharge: 2023-10-20 | Disposition: A | Discharge status: Home / Self Care | Payer: Worker's Compensation | Attending: Emergency Medicine | Admitting: Emergency Medicine

## 2023-10-20 ENCOUNTER — Encounter: Payer: Self-pay | Admitting: Oncology

## 2023-10-20 ENCOUNTER — Ambulatory Visit (INDEPENDENT_AMBULATORY_CARE_PROVIDER_SITE_OTHER): Payer: Worker's Compensation

## 2023-10-20 DIAGNOSIS — S62515A Nondisplaced fracture of proximal phalanx of left thumb, initial encounter for closed fracture: Secondary | ICD-10-CM | POA: Diagnosis not present

## 2023-10-20 DIAGNOSIS — Z23 Encounter for immunization: Secondary | ICD-10-CM

## 2023-10-20 DIAGNOSIS — S6992XA Unspecified injury of left wrist, hand and finger(s), initial encounter: Secondary | ICD-10-CM

## 2023-10-20 MED ORDER — TETANUS-DIPHTH-ACELL PERTUSSIS 5-2.5-18.5 LF-MCG/0.5 IM SUSY
0.5000 mL | PREFILLED_SYRINGE | Freq: Once | INTRAMUSCULAR | Status: AC
  Administered 2023-10-20: 0.5 mL via INTRAMUSCULAR

## 2023-10-20 MED ORDER — IBUPROFEN 800 MG PO TABS
800.0000 mg | ORAL_TABLET | Freq: Once | ORAL | Status: AC
  Administered 2023-10-20: 800 mg via ORAL

## 2023-10-20 NOTE — ED Triage Notes (Signed)
 Patient presents to [facility] for left hand injury today. States "smashed hand with hammer 30 mins ago." States injury occurred to thumb area.

## 2023-10-20 NOTE — Discharge Instructions (Addendum)
 You have an incomplete fracture of the proximal phalanx of your left thumb.  Wear the splint to protect your thumb from further injury and to provide support as your thumb heals.  Keep your left hand elevated is much as possible to decrease swelling and aid in pain relief.  You may apply ice to your thumb for 20 minutes at a time, 2-3 times a day, to help with pain and swelling.  Use over-the-counter Tylenol and/or ibuprofen according the pack instructions as needed for pain.  I have given the contact information for Dr. Mathis Bud with [facility].  She is a hand specialist.  Call Monday morning to make a follow-up appointment.

## 2023-10-20 NOTE — ED Provider Notes (Addendum)
 [facility]    CSN: [id number] Arrival date & time: 10/20/23  1044      History   Chief Complaint Chief Complaint  Patient presents with   Hand Injury    HPI David Wong is a 38 y.o. Wong.   HPI  38 year old Wong with past medical history significant for sleep apnea not on CPAP, IDA, GERD, depression, anxiety presents for evaluation of pain in his left thumb that started approximately 30 minutes ago.  He reports that he was at work using a 5 pound sledge and a chisel to try and separated chamber and he struck himself in his thumb with a sledgehammer.  He denies any numbness or tingling.  Limited range of motion.  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   Sleep apnea 06/29/2017   Anxiety 01/08/2015    Past Surgical History:  Procedure Laterality Date   COLONOSCOPY WITH PROPOFOL N/A 10/08/2018   Procedure: COLONOSCOPY WITH PROPOFOL;  Surgeon: Toney Reil, MD;  Location: [facility] ENDOSCOPY;  Service: Gastroenterology;  Laterality: N/A;   ESOPHAGOGASTRODUODENOSCOPY (EGD) WITH PROPOFOL N/A 10/08/2018   Procedure: ESOPHAGOGASTRODUODENOSCOPY (EGD) WITH PROPOFOL;  Surgeon: Toney Reil, MD;  Location: [facility] ENDOSCOPY;  Service: Gastroenterology;  Laterality: N/A;   FISTULOTOMY N/A 04/14/2022   Procedure: FISTULOTOMY, intersphinctric;  Surgeon: Campbell Lerner, MD;  Location: [facility] ORS;  Service: General;  Laterality:  N/A;   HEMORRHOID SURGERY N/A 11/17/2019   Procedure: HEMORRHOIDECTOMY Internal & External;  Surgeon: Campbell Lerner, MD;  Location: [facility] ORS;  Service: General;  Laterality: N/A;   RECTAL EXAM UNDER ANESTHESIA N/A 04/14/2022   Procedure: RECTAL EXAM UNDER ANESTHESIA;  Surgeon: Campbell Lerner, MD;  Location: [facility] ORS;  Service: General;  Laterality: N/A;   right pinky finger  2013   pins inserted       Home Medications    Prior to Admission medications   Medication Sig Start Date End Date Taking? Authorizing Provider  citalopram (CELEXA) 20 MG tablet Take 1 tablet (20 mg total) by mouth daily. 01/17/23   Marjie Skiff, NP    Family History Family History  Problem Relation Age of Onset   Hyperlipidemia Mother    Hypertension Mother    Hyperlipidemia Father    Diabetes Father    Hypertension Brother    Diabetes Brother    Arthritis Maternal Grandfather        RA   Heart disease Paternal Grandfather    Breast cancer Maternal Aunt     Social History Social History   Tobacco Use   Smoking status: Never   Smokeless tobacco: Never  Vaping Use   Vaping status: Every Day   Start date: 02/03/2016  Substance Use Topics   Alcohol use: Yes    Alcohol/week: 0.0 standard drinks of alcohol    Comment: Rarely   Drug use: No     Allergies  Patient has no known allergies.   Review of Systems Review of Systems  Musculoskeletal:  Positive for arthralgias and joint swelling.  Skin:  Positive for wound. Negative for color change.  Neurological:  Negative for numbness.     Physical Exam Triage Vital Signs ED Triage Vitals  Encounter Vitals Group     BP      Systolic BP Percentile      Diastolic BP Percentile      Pulse      Resp      Temp      Temp src      SpO2      Weight      Height      Head Circumference      Peak Flow      Pain Score      Pain Loc      Pain Education      Exclude from Growth Chart    No data found.  Updated Vital Signs BP  129/88 (BP Location: Right Arm)   Pulse 73   Temp 97.8 F (36.6 C) (Oral)   Resp 18   SpO2 97%   Visual Acuity Right Eye Distance:   Left Eye Distance:   Bilateral Distance:    Right Eye Near:   Left Eye Near:    Bilateral Near:     Physical Exam Vitals and nursing note reviewed.  Constitutional:      Appearance: Normal appearance. He is not ill-appearing.  HENT:     Head: Normocephalic and atraumatic.  Musculoskeletal:        General: Swelling, tenderness and signs of injury present.  Skin:    General: Skin is warm and dry.     Capillary Refill: Capillary refill takes less than 2 seconds.     Findings: No erythema.  Neurological:     General: No focal deficit present.     Mental Status: He is alert and oriented to person, place, and time.      UC Treatments / Results  Labs (all labs ordered are listed, but only abnormal results are displayed) Labs Reviewed - No data to display  EKG   Radiology No results found.  Procedures Procedures (including critical care time)  Medications Ordered in UC Medications  ibuprofen (ADVIL) tablet 800 mg (800 mg Oral Given 10/20/23 1148)  Tdap (BOOSTRIX) injection 0.5 mL (0.5 mLs Intramuscular Given 10/20/23 1148)    Initial Impression / Assessment and Plan / UC Course  I have reviewed the triage vital signs and the nursing notes.  Pertinent labs & imaging results that were available during my care of the patient were reviewed by me and considered in my medical decision making (see chart for details).   Patient is a pleasant, nontoxic-appearing David Wong presenting for evaluation of a crush injury to his left thumb from a sledgehammer approximately 30 minutes prior to arrival.  He reports that he was trying to separate a chamber with a 5 pound sledgehammer and a chisel when he wind up striking his left thumb at the MCP joint.  He has a small abrasion to the medial aspect of the dorsal MCP joint.  He has tenderness with  palpation of the proximal aspect of the proximal phalanx as well as with palpation of the MCP joint itself.  The first metacarpal is nontender to palpation.  There is some soft tissue swelling present but no ecchymosis.  He has limited flexion extension of his thumb but normal  sensation at the tip.  He reports that his last tetanus shot may have been 5 years or greater so I will order a updated the patient's tetanus shot.  I will also do a radiograph of the left thumb to rule out any bony abnormality.  The patient has not take anything for pain and does not have any allergies.  I offered him an injection of Toradol or oral ibuprofen and he selected oral ibuprofen.  I will order 100 mg of ibuprofen for the patient's pain.  Left thumb x-rays independently reviewed and evaluated by me.  Impression: There is a nondisplaced, incomplete fracture, of the proximal aspect of the proximal phalanx.  Soft tissue swelling is present.  Radiology overread is pending.  I will have staff place patient in a thumb spica wrist splint to help immobilize his thumb and protect it from further injury.  I will give him the contact information for Dr. Mathis Bud, the hand specialist at [facility], to make a follow-up appointment.  Tylenol and/or ibuprofen as needed for pain.  Elevation and ice application to help with pain and swelling.  Return precaution reviewed.  I have ordered bacitracin and Band-Aid to the abrasion on the left thumb.   Final Clinical Impressions(s) / UC Diagnoses   Final diagnoses:  Injury of left thumb, initial encounter  Closed nondisplaced fracture of proximal phalanx of left thumb, initial encounter     Discharge Instructions      You have an incomplete fracture of the proximal phalanx of your left thumb.  Wear the splint to protect your thumb from further injury and to provide support as your thumb heals.  Keep your left hand elevated is much as possible to decrease swelling and aid in  pain relief.  You may apply ice to your thumb for 20 minutes at a time, 2-3 times a day, to help with pain and swelling.  Use over-the-counter Tylenol and/or ibuprofen according the pack instructions as needed for pain.  I have given the contact information for Dr. Mathis Bud with [facility].  She is a hand specialist.  Call Monday morning to make a follow-up appointment.     ED Prescriptions   None    PDMP not reviewed this encounter.   Becky Augusta, NP 10/20/23 2130    Becky Augusta, NP 10/20/23 1210

## 2024-01-21 ENCOUNTER — Other Ambulatory Visit: Payer: Self-pay | Admitting: Nurse Practitioner

## 2024-01-22 NOTE — Telephone Encounter (Signed)
 Requested medication (s) are due for refill today:   Yes  Requested medication (s) are on the active medication list:   Yes  Future visit scheduled:   No.  Was a No Show 09/25/2023;  Cancelled 3/12 and 10/15/2023 appts.   LOV 07/20/2023.   Last ordered: 01/17/2023 #30, 12 refills  Unable to refill because pt has cancelled and been a No Show for last several appts.     Requested Prescriptions  Pending Prescriptions Disp Refills   citalopram  (CELEXA ) 20 MG tablet [Pharmacy Med Name: CITALOPRAM  20MG  TABLETS] 30 tablet 12    Sig: TAKE 1 TABLET(20 MG) BY MOUTH DAILY     Psychiatry:  Antidepressants - SSRI Failed - 01/22/2024  3:22 PM      Failed - Valid encounter within last 6 months    Recent Outpatient Visits           8 years ago Laceration of thumb, right, initial encounter   [facility] [place], PA-C              Passed - Completed PHQ-2 or PHQ-9 in the last 360 days

## 2024-01-23 NOTE — Telephone Encounter (Signed)
 Called patient to get him scheduled, but his mailbox is full could not leave a message.

## 2024-02-05 NOTE — Telephone Encounter (Signed)
 Appt scheduled

## 2024-03-15 NOTE — Patient Instructions (Signed)
 Managing Depression, Adult Depression is a mental health condition that affects your thoughts, feelings, and actions. Being diagnosed with depression can bring you relief if you did not know why you have felt or behaved a certain way. It could also leave you feeling overwhelmed. Finding ways to manage your symptoms can help you feel more positive about your future. How to manage lifestyle changes Being depressed is difficult. Depression can increase the level of everyday stress. Stress can make depression symptoms worse. You may believe your symptoms cannot be managed or will never improve. However, there are many things you can try to help manage your symptoms. There is hope. Managing stress  Stress is your body's reaction to life changes and events, both good and bad. Stress can add to your feelings of depression. Learning to manage your stress can help lessen your feelings of depression. Try some of the following approaches to reducing your stress (stress reduction techniques): Listen to music that you enjoy and that inspires you. Try using a meditation app or take a meditation class. Develop a practice that helps you connect with your spiritual self. Walk in nature, pray, or go to a place of worship. Practice deep breathing. To do this, inhale slowly through your nose. Pause at the top of your inhale for a few seconds and then exhale slowly, letting yourself relax. Repeat this three or four times. Practice yoga to help relax and work your muscles. Choose a stress reduction technique that works for you. These techniques take time and practice to develop. Set aside 5-15 minutes a day to do them. Therapists can offer training in these techniques. Do these things to help manage stress: Keep a journal. Know your limits. Set healthy boundaries for yourself and others, such as saying "no" when you think something is too much. Pay attention to how you react to certain situations. You may not be able to  control everything, but you can change your reaction. Add humor to your life by watching funny movies or shows. Make time for activities that you enjoy and that relax you. Spend less time using electronics, especially at night before bed. The light from screens can make your brain think it is time to get up rather than go to bed.  Medicines Medicines, such as antidepressants, are often a part of treatment for depression. Talk with your pharmacist or health care provider about all the medicines, supplements, and herbal products that you take, their possible side effects, and what medicines and other products are safe to take together. Make sure to report any side effects you may have to your health care provider. Relationships Your health care provider may suggest family therapy, couples therapy, or individual therapy as part of your treatment. How to recognize changes Everyone responds differently to treatment for depression. As you recover from depression, you may start to: Have more interest in doing activities. Feel more hopeful. Have more energy. Eat a more regular amount of food. Have better mental focus. It is important to recognize if your depression is not getting better or is getting worse. The symptoms you had in the beginning may return, such as: Feeling tired. Eating too much or too little. Sleeping too much or too little. Feeling restless, agitated, or hopeless. Trouble focusing or making decisions. Having unexplained aches and pains. Feeling irritable, angry, or aggressive. If you or your family members notice these symptoms coming back, let your health care provider know right away. Follow these instructions at home: Activity Try to  get some form of exercise each day, such as walking. Try yoga, mindfulness, or other stress reduction techniques. Participate in group activities if you are able. Lifestyle Get enough sleep. Cut down on or stop using caffeine, tobacco,  alcohol, and any other harmful substances. Eat a healthy diet that includes plenty of vegetables, fruits, whole grains, low-fat dairy products, and lean protein. Limit foods that are high in solid fats, added sugar, or salt (sodium). General instructions Take over-the-counter and prescription medicines only as told by your health care provider. Keep all follow-up visits. It is important for your health care provider to check on your mood, behavior, and medicines. Your health care provider may need to make changes to your treatment. Where to find support Talking to others  Friends and family members can be sources of support and guidance. Talk to trusted friends or family members about your condition. Explain your symptoms and let them know that you are working with a health care provider to treat your depression. Tell friends and family how they can help. Finances Find mental health providers that fit with your financial situation. Talk with your health care provider if you are worried about access to food, housing, or medicine. Call your insurance company to learn about your co-pays and prescription plan. Where to find more information You can find support in your area from: Anxiety and Depression Association of America (ADAA): adaa.org Mental Health America: mentalhealthamerica.net [employer] on Mental Illness: nami.org Contact a health care provider if: You stop taking your antidepressant medicines, and you have any of these symptoms: Nausea. Headache. Light-headedness. Chills and body aches. Not being able to sleep (insomnia). You or your friends and family think your depression is getting worse. Get help right away if: You have thoughts of hurting yourself or others. Get help right away if you feel like you may hurt yourself or others, or have thoughts about taking your own life. Go to your nearest emergency room or: Call 911. Call the National Suicide Prevention Lifeline at  (215) 435-0408 or 988. This is open 24 hours a day. Text the Crisis Text Line at 318 581 7774. This information is not intended to replace advice given to you by your health care provider. Make sure you discuss any questions you have with your health care provider. Document Revised: 11/22/2021 Document Reviewed: 11/22/2021 Elsevier Patient Education  2024 [employer].

## 2024-03-19 ENCOUNTER — Ambulatory Visit (INDEPENDENT_AMBULATORY_CARE_PROVIDER_SITE_OTHER): Admitting: Nurse Practitioner

## 2024-03-19 ENCOUNTER — Encounter: Payer: Self-pay | Admitting: Oncology

## 2024-03-19 ENCOUNTER — Encounter: Payer: Self-pay | Admitting: Nurse Practitioner

## 2024-03-19 VITALS — BP 115/72 | HR 85 | Temp 98.6°F | Ht 70.5 in | Wt 227.4 lb

## 2024-03-19 DIAGNOSIS — E6609 Other obesity due to excess calories: Secondary | ICD-10-CM

## 2024-03-19 DIAGNOSIS — E66811 Obesity, class 1: Secondary | ICD-10-CM

## 2024-03-19 DIAGNOSIS — F419 Anxiety disorder, unspecified: Secondary | ICD-10-CM

## 2024-03-19 DIAGNOSIS — Z6832 Body mass index (BMI) 32.0-32.9, adult: Secondary | ICD-10-CM

## 2024-03-19 DIAGNOSIS — F324 Major depressive disorder, single episode, in partial remission: Secondary | ICD-10-CM | POA: Diagnosis not present

## 2024-03-19 MED ORDER — CITALOPRAM HYDROBROMIDE 20 MG PO TABS: 20.0000 mg | ORAL_TABLET | Freq: Every day | ORAL | 3 refills | Status: AC

## 2024-03-19 NOTE — Progress Notes (Signed)
 BP 115/72   Pulse 85   Temp 98.6 F (37 C) (Oral)   Ht 5' 10.5 (1.791 m)   Wt 227 lb 6.4 oz (103.1 kg)   SpO2 97%   BMI 32.17 kg/m    Subjective:    Patient ID: David Wong, male    DOB: 1986-01-14, 38 y.o.   MRN: [id number]  HPI: [name] is a 38 y.o. male  Chief Complaint  Patient presents with   Depression   DEPRESSION & ANXIETY Continues to take Celexa  20 MG daily.  Under a little work stress recently. Mood status: stable Satisfied with current treatment?: yes Symptom severity: moderate  Duration of current treatment : chronic Side effects: no Medication compliance: good compliance Psychotherapy/counseling: none Depressed mood: no Anxious mood: situational only Anhedonia: no Significant weight loss or gain: no Insomnia: no Fatigue: no Feelings of worthlessness or guilt: no Impaired concentration/indecisiveness: no Suicidal ideations: no Hopelessness: no Crying spells: no    03/19/2024    1:59 PM 07/20/2023    1:09 PM 01/17/2023    2:49 PM 12/05/2022   10:24 AM 09/01/2022   10:01 AM  Depression screen PHQ 2/9  Decreased Interest 0 0 0 0 0  Down, Depressed, Hopeless 1 1 0 0 0  PHQ - 2 Score 1 1 0 0 0  Altered sleeping 0 1 0 1 1  Tired, decreased energy 2 0 1 0 0  Change in appetite 0 0 0 0 0  Feeling bad or failure about yourself  0 1 0 0 0  Trouble concentrating 0 0 0 0 1  Moving slowly or fidgety/restless 0 0 0 0 0  Suicidal thoughts 0 0 0 0 0  PHQ-9 Score 3 3 1 1 2   Difficult doing work/chores Not difficult at all Not difficult at all Not difficult at all Not difficult at all Not difficult at all       03/19/2024    1:59 PM 07/20/2023    1:09 PM 01/17/2023    2:49 PM 12/05/2022   11:00 AM  GAD 7 : Generalized Anxiety Score  Nervous, Anxious, on Edge 2 0 0 0  Control/stop worrying 0 0 1 0  Worry too much - different things 0 0 0 1  Trouble relaxing 0 0 0 0  Restless 0 0 0 0  Easily annoyed or irritable 0 1 0 1  Afraid - awful might  happen 0 0 0 0  Total GAD 7 Score 2 1 1 2   Anxiety Difficulty Not difficult at all Not difficult at all Not difficult at all Not difficult at all   Relevant past medical, surgical, family and social history reviewed and updated as indicated. Interim medical history since our last visit reviewed. Allergies and medications reviewed and updated.  Review of Systems  Constitutional:  Negative for activity change, diaphoresis, fatigue and fever.  Respiratory:  Negative for cough, chest tightness, shortness of breath and wheezing.   Cardiovascular:  Negative for chest pain, palpitations and leg swelling.  Gastrointestinal: Negative.   Neurological: Negative.   Psychiatric/Behavioral: Negative.      Per HPI unless specifically indicated above     Objective:    BP 115/72   Pulse 85   Temp 98.6 F (37 C) (Oral)   Ht 5' 10.5 (1.791 m)   Wt 227 lb 6.4 oz (103.1 kg)   SpO2 97%   BMI 32.17 kg/m   Wt Readings from Last 3 Encounters:  03/19/24 227 lb  6.4 oz (103.1 kg)  07/20/23 227 lb (103 kg)  05/25/23 224 lb 3.2 oz (101.7 kg)    Physical Exam Vitals and nursing note reviewed.  Constitutional:      General: He is awake. He is not in acute distress.    Appearance: He is well-developed and well-groomed. He is obese. He is not ill-appearing or toxic-appearing.  HENT:     Head: Normocephalic.     Right Ear: Hearing and external ear normal.     Left Ear: Hearing and external ear normal.  Eyes:     General: Lids are normal.     Extraocular Movements: Extraocular movements intact.     Conjunctiva/sclera: Conjunctivae normal.  Neck:     Thyroid: No thyromegaly.     Vascular: No carotid bruit.  Cardiovascular:     Rate and Rhythm: Normal rate and regular rhythm.     Heart sounds: Normal heart sounds. No murmur heard.    No gallop.  Pulmonary:     Effort: No accessory muscle usage or respiratory distress.     Breath sounds: Normal breath sounds.  Abdominal:     General: Bowel  sounds are normal. There is no distension.     Palpations: Abdomen is soft.     Tenderness: There is no abdominal tenderness.  Musculoskeletal:     Cervical back: Full passive range of motion without pain.     Right lower leg: No edema.     Left lower leg: No edema.  Lymphadenopathy:     Cervical: No cervical adenopathy.  Skin:    General: Skin is warm.     Capillary Refill: Capillary refill takes less than 2 seconds.  Neurological:     Mental Status: He is alert and oriented to person, place, and time.     Deep Tendon Reflexes: Reflexes are normal and symmetric.     Reflex Scores:      Brachioradialis reflexes are 2+ on the right side and 2+ on the left side.      Patellar reflexes are 2+ on the right side and 2+ on the left side. Psychiatric:        Attention and Perception: Attention normal.        Mood and Affect: Mood normal.        Speech: Speech normal.        Behavior: Behavior normal. Behavior is cooperative.        Thought Content: Thought content normal.     Results for orders placed or performed in visit on 07/20/23  CBC with Differential/Platelet   Collection Time: 07/20/23  1:23 PM  Result Value Ref Range   WBC 7.0 3.4 - 10.8 x10E3/uL   RBC 5.07 4.14 - 5.80 x10E6/uL   Hemoglobin 16.1 13.0 - 17.7 g/dL   Hematocrit 51.4 62.4 - 51.0 %   MCV 96 79 - 97 fL   MCH 31.8 26.6 - 33.0 pg   MCHC 33.2 31.5 - 35.7 g/dL   RDW 88.0 88.3 - 84.5 %   Platelets 205 150 - 450 x10E3/uL   Neutrophils 47 Not Estab. %   Lymphs 39 Not Estab. %   Monocytes 8 Not Estab. %   Eos 5 Not Estab. %   Basos 1 Not Estab. %   Neutrophils Absolute 3.3 1.4 - 7.0 x10E3/uL   Lymphocytes Absolute 2.8 0.7 - 3.1 x10E3/uL   Monocytes Absolute 0.6 0.1 - 0.9 x10E3/uL   EOS (ABSOLUTE) 0.3 0.0 - 0.4 x10E3/uL   Basophils  Absolute 0.0 0.0 - 0.2 x10E3/uL   Immature Granulocytes 0 Not Estab. %   Immature Grans (Abs) 0.0 0.0 - 0.1 x10E3/uL  Comprehensive metabolic panel   Collection Time: 07/20/23  1:23  PM  Result Value Ref Range   Glucose 88 70 - 99 mg/dL   BUN 12 6 - 20 mg/dL   Creatinine, Ser 8.96 0.76 - 1.27 mg/dL   eGFR 97 >40 fO/fpw/8.26   BUN/Creatinine Ratio 12 9 - 20   Sodium 140 134 - 144 mmol/L   Potassium 4.3 3.5 - 5.2 mmol/L   Chloride 105 96 - 106 mmol/L   CO2 20 20 - 29 mmol/L   Calcium 9.6 8.7 - 10.2 mg/dL   Total Protein 7.1 6.0 - 8.5 g/dL   Albumin 4.4 4.1 - 5.1 g/dL   Globulin, Total 2.7 1.5 - 4.5 g/dL   Bilirubin Total 0.5 0.0 - 1.2 mg/dL   Alkaline Phosphatase 99 44 - 121 IU/L   AST 44 (H) 0 - 40 IU/L   ALT 108 (H) 0 - 44 IU/L  Lipid Panel w/o Chol/HDL Ratio   Collection Time: 07/20/23  1:23 PM  Result Value Ref Range   Cholesterol, Total 236 (H) 100 - 199 mg/dL   Triglycerides 749 (H) 0 - 149 mg/dL   HDL 46 >60 mg/dL   VLDL Cholesterol Cal 45 (H) 5 - 40 mg/dL   LDL Chol Calc (NIH) 854 (H) 0 - 99 mg/dL  TSH   Collection Time: 07/20/23  1:23 PM  Result Value Ref Range   TSH 0.685 0.450 - 4.500 uIU/mL  Ferritin   Collection Time: 07/20/23  1:23 PM  Result Value Ref Range   Ferritin 240 30 - 400 ng/mL  Iron   Collection Time: 07/20/23  1:23 PM  Result Value Ref Range   Iron 100 38 - 169 ug/dL  Hepatitis C antibody   Collection Time: 07/20/23  1:23 PM  Result Value Ref Range   Hep C Virus Ab Non Reactive Non Reactive  HgB A1c   Collection Time: 07/20/23  1:23 PM  Result Value Ref Range   Hgb A1c MFr Bld 5.6 4.8 - 5.6 %   Est. average glucose Bld gHb Est-mCnc 114 mg/dL      Assessment & Plan:   Problem List Items Addressed This Visit       Other   Obesity   BMI 32.17.  Recommended eating smaller high protein, low fat meals more frequently and exercising 30 mins a day 5 times a week with a goal of 10-15lb weight loss in the next 3 months. Patient voiced their understanding and motivation to adhere to these recommendations.       Depression, major, single episode, in partial remission (HCC) - Primary   Chronic, stable. Denies SI/HI.   Continue Celexa  20 MG daily which offers him benefit.  Educated him on this regimen.  Discussed deep breathing exercises.  Refills sent in.      Relevant Medications   citalopram  (CELEXA ) 20 MG tablet   Anxiety   Refer to depression plan of care.      Relevant Medications   citalopram  (CELEXA ) 20 MG tablet     Follow up plan: Return in about 6 months (around 09/19/2024) for Annual Physical.

## 2024-03-19 NOTE — Assessment & Plan Note (Signed)
BMI 32.17.  Recommended eating smaller high protein, low fat meals more frequently and exercising 30 mins a day 5 times a week with a goal of 10-15lb weight loss in the next 3 months. Patient voiced their understanding and motivation to adhere to these recommendations.

## 2024-03-19 NOTE — Assessment & Plan Note (Signed)
 Chronic, stable. Denies SI/HI.  Continue Celexa  20 MG daily which offers him benefit.  Educated him on this regimen.  Discussed deep breathing exercises.  Refills sent in.

## 2024-03-19 NOTE — Assessment & Plan Note (Signed)
 Refer to depression plan of care.

## 2024-09-22 ENCOUNTER — Encounter: Payer: PRIVATE HEALTH INSURANCE | Admitting: Nurse Practitioner
